# Patient Record
Sex: Male | Born: 1935 | Race: White | Hispanic: No | Smoking: Former smoker
Health system: Southern US, Community
[De-identification: ages and names within clinical notes are randomized; demographics above are authoritative.]

## PROBLEM LIST (undated history)

## (undated) DIAGNOSIS — I2510 Atherosclerotic heart disease of native coronary artery without angina pectoris: Secondary | ICD-10-CM

## (undated) DIAGNOSIS — I951 Orthostatic hypotension: Secondary | ICD-10-CM

## (undated) DIAGNOSIS — E039 Hypothyroidism, unspecified: Secondary | ICD-10-CM

## (undated) DIAGNOSIS — E785 Hyperlipidemia, unspecified: Secondary | ICD-10-CM

## (undated) DIAGNOSIS — J449 Chronic obstructive pulmonary disease, unspecified: Secondary | ICD-10-CM

## (undated) DIAGNOSIS — I480 Paroxysmal atrial fibrillation: Secondary | ICD-10-CM

## (undated) DIAGNOSIS — G250 Essential tremor: Secondary | ICD-10-CM

## (undated) DIAGNOSIS — I10 Essential (primary) hypertension: Secondary | ICD-10-CM

## (undated) DIAGNOSIS — K274 Chronic or unspecified peptic ulcer, site unspecified, with hemorrhage: Secondary | ICD-10-CM

## (undated) HISTORY — PX: HERNIA REPAIR: SHX51

## (undated) HISTORY — DX: Chronic or unspecified peptic ulcer, site unspecified, with hemorrhage: K27.4

## (undated) HISTORY — DX: Chronic obstructive pulmonary disease, unspecified: J44.9

## (undated) HISTORY — DX: Hyperlipidemia, unspecified: E78.5

## (undated) HISTORY — DX: Atherosclerotic heart disease of native coronary artery without angina pectoris: I25.10

## (undated) HISTORY — DX: Essential tremor: G25.0

---

## 1973-12-08 HISTORY — PX: SPINE SURGERY: SHX786

## 2005-04-18 ENCOUNTER — Ambulatory Visit: Payer: Self-pay | Admitting: Unknown Physician Specialty

## 2012-03-31 DIAGNOSIS — I10 Essential (primary) hypertension: Secondary | ICD-10-CM | POA: Diagnosis not present

## 2012-03-31 DIAGNOSIS — J449 Chronic obstructive pulmonary disease, unspecified: Secondary | ICD-10-CM | POA: Diagnosis not present

## 2012-03-31 DIAGNOSIS — E785 Hyperlipidemia, unspecified: Secondary | ICD-10-CM | POA: Diagnosis not present

## 2012-03-31 DIAGNOSIS — J441 Chronic obstructive pulmonary disease with (acute) exacerbation: Secondary | ICD-10-CM | POA: Diagnosis not present

## 2012-03-31 DIAGNOSIS — I2510 Atherosclerotic heart disease of native coronary artery without angina pectoris: Secondary | ICD-10-CM | POA: Diagnosis not present

## 2012-04-23 DIAGNOSIS — J449 Chronic obstructive pulmonary disease, unspecified: Secondary | ICD-10-CM | POA: Diagnosis not present

## 2012-04-23 DIAGNOSIS — J209 Acute bronchitis, unspecified: Secondary | ICD-10-CM | POA: Diagnosis not present

## 2012-04-23 DIAGNOSIS — J069 Acute upper respiratory infection, unspecified: Secondary | ICD-10-CM | POA: Diagnosis not present

## 2012-06-03 DIAGNOSIS — M6740 Ganglion, unspecified site: Secondary | ICD-10-CM | POA: Diagnosis not present

## 2012-09-07 DIAGNOSIS — K274 Chronic or unspecified peptic ulcer, site unspecified, with hemorrhage: Secondary | ICD-10-CM

## 2012-09-07 DIAGNOSIS — K284 Chronic or unspecified gastrojejunal ulcer with hemorrhage: Secondary | ICD-10-CM

## 2012-09-07 HISTORY — DX: Chronic or unspecified gastrojejunal ulcer with hemorrhage: K28.4

## 2012-09-07 HISTORY — DX: Chronic or unspecified peptic ulcer, site unspecified, with hemorrhage: K27.4

## 2012-09-28 DIAGNOSIS — I10 Essential (primary) hypertension: Secondary | ICD-10-CM | POA: Diagnosis not present

## 2012-09-28 DIAGNOSIS — Z0000 Encounter for general adult medical examination without abnormal findings: Secondary | ICD-10-CM | POA: Diagnosis not present

## 2012-09-28 DIAGNOSIS — J449 Chronic obstructive pulmonary disease, unspecified: Secondary | ICD-10-CM | POA: Diagnosis not present

## 2012-09-28 DIAGNOSIS — J441 Chronic obstructive pulmonary disease with (acute) exacerbation: Secondary | ICD-10-CM | POA: Diagnosis not present

## 2012-09-28 DIAGNOSIS — E785 Hyperlipidemia, unspecified: Secondary | ICD-10-CM | POA: Diagnosis not present

## 2012-09-28 DIAGNOSIS — Z23 Encounter for immunization: Secondary | ICD-10-CM | POA: Diagnosis not present

## 2012-09-28 DIAGNOSIS — I2510 Atherosclerotic heart disease of native coronary artery without angina pectoris: Secondary | ICD-10-CM | POA: Diagnosis not present

## 2012-09-28 DIAGNOSIS — Z125 Encounter for screening for malignant neoplasm of prostate: Secondary | ICD-10-CM | POA: Diagnosis not present

## 2012-10-02 ENCOUNTER — Inpatient Hospital Stay: Payer: Self-pay | Admitting: Student

## 2012-10-02 DIAGNOSIS — E785 Hyperlipidemia, unspecified: Secondary | ICD-10-CM | POA: Diagnosis present

## 2012-10-02 DIAGNOSIS — K222 Esophageal obstruction: Secondary | ICD-10-CM | POA: Diagnosis not present

## 2012-10-02 DIAGNOSIS — R079 Chest pain, unspecified: Secondary | ICD-10-CM | POA: Diagnosis not present

## 2012-10-02 DIAGNOSIS — Z043 Encounter for examination and observation following other accident: Secondary | ICD-10-CM | POA: Diagnosis not present

## 2012-10-02 DIAGNOSIS — Z7982 Long term (current) use of aspirin: Secondary | ICD-10-CM | POA: Diagnosis not present

## 2012-10-02 DIAGNOSIS — I951 Orthostatic hypotension: Secondary | ICD-10-CM | POA: Diagnosis not present

## 2012-10-02 DIAGNOSIS — R6889 Other general symptoms and signs: Secondary | ICD-10-CM | POA: Diagnosis not present

## 2012-10-02 DIAGNOSIS — E039 Hypothyroidism, unspecified: Secondary | ICD-10-CM | POA: Diagnosis not present

## 2012-10-02 DIAGNOSIS — Z791 Long term (current) use of non-steroidal anti-inflammatories (NSAID): Secondary | ICD-10-CM | POA: Diagnosis not present

## 2012-10-02 DIAGNOSIS — J4489 Other specified chronic obstructive pulmonary disease: Secondary | ICD-10-CM | POA: Diagnosis not present

## 2012-10-02 DIAGNOSIS — K259 Gastric ulcer, unspecified as acute or chronic, without hemorrhage or perforation: Secondary | ICD-10-CM | POA: Diagnosis present

## 2012-10-02 DIAGNOSIS — J9383 Other pneumothorax: Secondary | ICD-10-CM | POA: Diagnosis present

## 2012-10-02 DIAGNOSIS — K2961 Other gastritis with bleeding: Secondary | ICD-10-CM | POA: Diagnosis not present

## 2012-10-02 DIAGNOSIS — I10 Essential (primary) hypertension: Secondary | ICD-10-CM | POA: Diagnosis present

## 2012-10-02 DIAGNOSIS — K921 Melena: Secondary | ICD-10-CM | POA: Diagnosis not present

## 2012-10-02 DIAGNOSIS — K2960 Other gastritis without bleeding: Secondary | ICD-10-CM | POA: Diagnosis not present

## 2012-10-02 DIAGNOSIS — K922 Gastrointestinal hemorrhage, unspecified: Secondary | ICD-10-CM | POA: Diagnosis not present

## 2012-10-02 DIAGNOSIS — J449 Chronic obstructive pulmonary disease, unspecified: Secondary | ICD-10-CM | POA: Diagnosis not present

## 2012-10-02 DIAGNOSIS — R55 Syncope and collapse: Secondary | ICD-10-CM | POA: Diagnosis not present

## 2012-10-02 DIAGNOSIS — D62 Acute posthemorrhagic anemia: Secondary | ICD-10-CM | POA: Diagnosis present

## 2012-10-02 LAB — CBC
HCT: 32.3 % — ABNORMAL LOW (ref 40.0–52.0)
MCH: 33.2 pg (ref 26.0–34.0)
MCHC: 33.8 g/dL (ref 32.0–36.0)
RBC: 3.29 10*6/uL — ABNORMAL LOW (ref 4.40–5.90)
RDW: 13.5 % (ref 11.5–14.5)
WBC: 8.8 10*3/uL (ref 3.8–10.6)

## 2012-10-02 LAB — URINALYSIS, COMPLETE
Bacteria: NONE SEEN
Blood: NEGATIVE
Glucose,UR: NEGATIVE mg/dL (ref 0–75)
Hyaline Cast: 1
Nitrite: NEGATIVE
Ph: 6 (ref 4.5–8.0)
RBC,UR: 1 /HPF (ref 0–5)
Specific Gravity: 1.02 (ref 1.003–1.030)
WBC UR: 2 /HPF (ref 0–5)

## 2012-10-02 LAB — BASIC METABOLIC PANEL
Anion Gap: 7 (ref 7–16)
Calcium, Total: 8 mg/dL — ABNORMAL LOW (ref 8.5–10.1)
Chloride: 112 mmol/L — ABNORMAL HIGH (ref 98–107)
Co2: 26 mmol/L (ref 21–32)
Creatinine: 1.04 mg/dL (ref 0.60–1.30)
EGFR (African American): >60
Potassium: 4.3 mmol/L (ref 3.5–5.1)

## 2012-10-02 LAB — TROPONIN I: Troponin-I: <0.02 ng/mL

## 2012-10-02 LAB — HEMOGLOBIN: HGB: 8.8 g/dL — ABNORMAL LOW (ref 13.0–18.0)

## 2012-10-03 LAB — CBC WITH DIFFERENTIAL/PLATELET
Basophil %: 0.3 %
Basophil %: 0.5 %
Eosinophil %: 1.6 %
Eosinophil %: 1.6 %
HCT: 26.9 % — ABNORMAL LOW (ref 40.0–52.0)
HGB: 8.9 g/dL — ABNORMAL LOW (ref 13.0–18.0)
HGB: 8.2 g/dL — ABNORMAL LOW (ref 13.0–18.0)
Lymphocyte #: 1.2 10*3/uL (ref 1.0–3.6)
Lymphocyte #: 1.3 10*3/uL (ref 1.0–3.6)
Lymphocyte %: 18.2 %
MCH: 32.5 pg (ref 26.0–34.0)
MCH: 33.2 pg (ref 26.0–34.0)
MCV: 98 fL (ref 80–100)
MCV: 97 fL (ref 80–100)
Monocyte #: 0.4 x10 3/mm (ref 0.2–1.0)
Monocyte #: 0.4 x10 3/mm (ref 0.2–1.0)
Monocyte %: 7.1 %
Neutrophil #: 3.7 10*3/uL (ref 1.4–6.5)
RBC: 2.73 10*6/uL — ABNORMAL LOW (ref 4.40–5.90)
RBC: 2.48 10*6/uL — ABNORMAL LOW (ref 4.40–5.90)
RDW: 13.4 % (ref 11.5–14.5)
RDW: 13.7 % (ref 11.5–14.5)
WBC: 6.4 10*3/uL (ref 3.8–10.6)

## 2012-10-03 LAB — COMPREHENSIVE METABOLIC PANEL
Albumin: 2.8 g/dL — ABNORMAL LOW (ref 3.4–5.0)
Anion Gap: 9 (ref 7–16)
BUN: 31 mg/dL — ABNORMAL HIGH (ref 7–18)
Calcium, Total: 7.6 mg/dL — ABNORMAL LOW (ref 8.5–10.1)
Glucose: 67 mg/dL (ref 65–99)
Osmolality: 293 (ref 275–301)
Potassium: 3.8 mmol/L (ref 3.5–5.1)
SGPT (ALT): 13 U/L (ref 12–78)
Sodium: 145 mmol/L (ref 136–145)
Total Protein: 5 g/dL — ABNORMAL LOW (ref 6.4–8.2)

## 2012-10-03 LAB — BUN
BUN: 26 mg/dL — ABNORMAL HIGH (ref 7–18)
BUN: 24 mg/dL — ABNORMAL HIGH (ref 7–18)

## 2012-10-03 LAB — PROTIME-INR
INR: 1.3
Prothrombin Time: 16.1 secs — ABNORMAL HIGH (ref 11.5–14.7)

## 2012-10-03 LAB — HEMOGLOBIN: HGB: 8.4 g/dL — ABNORMAL LOW (ref 13.0–18.0)

## 2012-10-04 LAB — CBC WITH DIFFERENTIAL/PLATELET
Basophil %: 0.5 %
Eosinophil #: 0.1 10*3/uL (ref 0.0–0.7)
Eosinophil %: 2.1 %
HGB: 8.2 g/dL — ABNORMAL LOW (ref 13.0–18.0)
Lymphocyte %: 22.1 %
MCH: 33.9 pg (ref 26.0–34.0)
MCV: 98 fL (ref 80–100)
Monocyte #: 0.3 x10 3/mm (ref 0.2–1.0)
Neutrophil #: 3.6 10*3/uL (ref 1.4–6.5)
Neutrophil %: 69.4 %
RDW: 13.6 % (ref 11.5–14.5)
WBC: 5.2 10*3/uL (ref 3.8–10.6)

## 2012-10-05 LAB — CBC WITH DIFFERENTIAL/PLATELET
Basophil #: 0 10*3/uL (ref 0.0–0.1)
Basophil %: 0.4 %
Eosinophil #: 0.2 10*3/uL (ref 0.0–0.7)
HCT: 25.3 % — ABNORMAL LOW (ref 40.0–52.0)
Lymphocyte #: 1.4 10*3/uL (ref 1.0–3.6)
Lymphocyte %: 28.7 %
MCHC: 34.7 g/dL (ref 32.0–36.0)
MCV: 97 fL (ref 80–100)
Neutrophil #: 3 10*3/uL (ref 1.4–6.5)
Platelet: 129 10*3/uL — ABNORMAL LOW (ref 150–440)
RDW: 13.4 % (ref 11.5–14.5)

## 2012-10-12 DIAGNOSIS — K922 Gastrointestinal hemorrhage, unspecified: Secondary | ICD-10-CM | POA: Diagnosis not present

## 2012-10-12 DIAGNOSIS — R0602 Shortness of breath: Secondary | ICD-10-CM | POA: Diagnosis not present

## 2012-10-12 DIAGNOSIS — J449 Chronic obstructive pulmonary disease, unspecified: Secondary | ICD-10-CM | POA: Diagnosis not present

## 2012-10-12 DIAGNOSIS — D649 Anemia, unspecified: Secondary | ICD-10-CM | POA: Diagnosis not present

## 2012-10-20 DIAGNOSIS — K259 Gastric ulcer, unspecified as acute or chronic, without hemorrhage or perforation: Secondary | ICD-10-CM | POA: Diagnosis not present

## 2012-11-25 ENCOUNTER — Ambulatory Visit: Payer: Self-pay | Admitting: Family Medicine

## 2012-11-25 DIAGNOSIS — M25559 Pain in unspecified hip: Secondary | ICD-10-CM | POA: Diagnosis not present

## 2012-11-25 DIAGNOSIS — R5381 Other malaise: Secondary | ICD-10-CM | POA: Diagnosis not present

## 2012-11-25 DIAGNOSIS — Q278 Other specified congenital malformations of peripheral vascular system: Secondary | ICD-10-CM | POA: Diagnosis not present

## 2012-11-25 DIAGNOSIS — R5383 Other fatigue: Secondary | ICD-10-CM | POA: Diagnosis not present

## 2012-11-25 DIAGNOSIS — M25569 Pain in unspecified knee: Secondary | ICD-10-CM | POA: Diagnosis not present

## 2012-11-25 DIAGNOSIS — M5137 Other intervertebral disc degeneration, lumbosacral region: Secondary | ICD-10-CM | POA: Diagnosis not present

## 2012-11-25 DIAGNOSIS — M51379 Other intervertebral disc degeneration, lumbosacral region without mention of lumbar back pain or lower extremity pain: Secondary | ICD-10-CM | POA: Diagnosis not present

## 2012-11-29 ENCOUNTER — Ambulatory Visit: Payer: Self-pay | Admitting: Family Medicine

## 2012-11-29 DIAGNOSIS — Z1389 Encounter for screening for other disorder: Secondary | ICD-10-CM | POA: Diagnosis not present

## 2012-11-29 LAB — CREATININE, SERUM: EGFR (Non-African Amer.): >60

## 2012-12-14 ENCOUNTER — Ambulatory Visit: Payer: Self-pay | Admitting: Family Medicine

## 2012-12-14 DIAGNOSIS — M5126 Other intervertebral disc displacement, lumbar region: Secondary | ICD-10-CM | POA: Diagnosis not present

## 2012-12-14 DIAGNOSIS — M47817 Spondylosis without myelopathy or radiculopathy, lumbosacral region: Secondary | ICD-10-CM | POA: Diagnosis not present

## 2012-12-21 ENCOUNTER — Ambulatory Visit: Payer: Self-pay | Admitting: Gastroenterology

## 2012-12-21 DIAGNOSIS — K2990 Gastroduodenitis, unspecified, without bleeding: Secondary | ICD-10-CM | POA: Diagnosis not present

## 2012-12-21 DIAGNOSIS — I10 Essential (primary) hypertension: Secondary | ICD-10-CM | POA: Diagnosis not present

## 2012-12-21 DIAGNOSIS — K449 Diaphragmatic hernia without obstruction or gangrene: Secondary | ICD-10-CM | POA: Diagnosis not present

## 2012-12-21 DIAGNOSIS — J449 Chronic obstructive pulmonary disease, unspecified: Secondary | ICD-10-CM | POA: Diagnosis not present

## 2012-12-21 DIAGNOSIS — E785 Hyperlipidemia, unspecified: Secondary | ICD-10-CM | POA: Diagnosis not present

## 2012-12-21 DIAGNOSIS — K2100 Gastro-esophageal reflux disease with esophagitis, without bleeding: Secondary | ICD-10-CM | POA: Diagnosis not present

## 2012-12-21 DIAGNOSIS — E039 Hypothyroidism, unspecified: Secondary | ICD-10-CM | POA: Diagnosis not present

## 2012-12-21 DIAGNOSIS — K2970 Gastritis, unspecified, without bleeding: Secondary | ICD-10-CM | POA: Diagnosis not present

## 2012-12-21 DIAGNOSIS — Z79899 Other long term (current) drug therapy: Secondary | ICD-10-CM | POA: Diagnosis not present

## 2012-12-21 DIAGNOSIS — K222 Esophageal obstruction: Secondary | ICD-10-CM | POA: Diagnosis not present

## 2012-12-21 DIAGNOSIS — Z8711 Personal history of peptic ulcer disease: Secondary | ICD-10-CM | POA: Diagnosis not present

## 2012-12-21 DIAGNOSIS — K259 Gastric ulcer, unspecified as acute or chronic, without hemorrhage or perforation: Secondary | ICD-10-CM | POA: Diagnosis not present

## 2012-12-22 DIAGNOSIS — M5137 Other intervertebral disc degeneration, lumbosacral region: Secondary | ICD-10-CM | POA: Diagnosis not present

## 2012-12-22 LAB — PATHOLOGY REPORT

## 2013-01-06 DIAGNOSIS — M5137 Other intervertebral disc degeneration, lumbosacral region: Secondary | ICD-10-CM | POA: Diagnosis not present

## 2013-01-17 DIAGNOSIS — M5137 Other intervertebral disc degeneration, lumbosacral region: Secondary | ICD-10-CM | POA: Diagnosis not present

## 2013-01-27 DIAGNOSIS — K259 Gastric ulcer, unspecified as acute or chronic, without hemorrhage or perforation: Secondary | ICD-10-CM | POA: Diagnosis not present

## 2013-04-06 DIAGNOSIS — I2510 Atherosclerotic heart disease of native coronary artery without angina pectoris: Secondary | ICD-10-CM | POA: Diagnosis not present

## 2013-04-06 DIAGNOSIS — I10 Essential (primary) hypertension: Secondary | ICD-10-CM | POA: Diagnosis not present

## 2013-04-06 DIAGNOSIS — E785 Hyperlipidemia, unspecified: Secondary | ICD-10-CM | POA: Diagnosis not present

## 2013-04-07 DIAGNOSIS — C44319 Basal cell carcinoma of skin of other parts of face: Secondary | ICD-10-CM | POA: Diagnosis not present

## 2013-04-07 DIAGNOSIS — L819 Disorder of pigmentation, unspecified: Secondary | ICD-10-CM | POA: Diagnosis not present

## 2013-04-07 DIAGNOSIS — Z85828 Personal history of other malignant neoplasm of skin: Secondary | ICD-10-CM | POA: Diagnosis not present

## 2013-04-07 DIAGNOSIS — L821 Other seborrheic keratosis: Secondary | ICD-10-CM | POA: Diagnosis not present

## 2013-04-07 DIAGNOSIS — Z0189 Encounter for other specified special examinations: Secondary | ICD-10-CM | POA: Diagnosis not present

## 2013-04-07 DIAGNOSIS — L570 Actinic keratosis: Secondary | ICD-10-CM | POA: Diagnosis not present

## 2013-05-04 DIAGNOSIS — C44319 Basal cell carcinoma of skin of other parts of face: Secondary | ICD-10-CM | POA: Diagnosis not present

## 2013-05-04 DIAGNOSIS — L570 Actinic keratosis: Secondary | ICD-10-CM | POA: Diagnosis not present

## 2013-05-18 DIAGNOSIS — H2510 Age-related nuclear cataract, unspecified eye: Secondary | ICD-10-CM | POA: Diagnosis not present

## 2013-06-01 DIAGNOSIS — H35379 Puckering of macula, unspecified eye: Secondary | ICD-10-CM | POA: Diagnosis not present

## 2013-06-01 DIAGNOSIS — H2510 Age-related nuclear cataract, unspecified eye: Secondary | ICD-10-CM | POA: Diagnosis not present

## 2013-06-21 DIAGNOSIS — H259 Unspecified age-related cataract: Secondary | ICD-10-CM | POA: Diagnosis not present

## 2013-06-21 DIAGNOSIS — H269 Unspecified cataract: Secondary | ICD-10-CM | POA: Diagnosis not present

## 2013-06-21 DIAGNOSIS — H2510 Age-related nuclear cataract, unspecified eye: Secondary | ICD-10-CM | POA: Diagnosis not present

## 2013-07-21 DIAGNOSIS — H2510 Age-related nuclear cataract, unspecified eye: Secondary | ICD-10-CM | POA: Diagnosis not present

## 2013-07-21 DIAGNOSIS — Z961 Presence of intraocular lens: Secondary | ICD-10-CM | POA: Diagnosis not present

## 2013-08-09 DIAGNOSIS — H2510 Age-related nuclear cataract, unspecified eye: Secondary | ICD-10-CM | POA: Diagnosis not present

## 2013-08-09 DIAGNOSIS — H259 Unspecified age-related cataract: Secondary | ICD-10-CM | POA: Diagnosis not present

## 2013-08-09 DIAGNOSIS — H269 Unspecified cataract: Secondary | ICD-10-CM | POA: Diagnosis not present

## 2013-10-04 DIAGNOSIS — Z125 Encounter for screening for malignant neoplasm of prostate: Secondary | ICD-10-CM | POA: Diagnosis not present

## 2013-10-04 DIAGNOSIS — I498 Other specified cardiac arrhythmias: Secondary | ICD-10-CM | POA: Diagnosis not present

## 2013-10-04 DIAGNOSIS — Z23 Encounter for immunization: Secondary | ICD-10-CM | POA: Diagnosis not present

## 2013-10-04 DIAGNOSIS — I2510 Atherosclerotic heart disease of native coronary artery without angina pectoris: Secondary | ICD-10-CM | POA: Diagnosis not present

## 2013-10-04 DIAGNOSIS — E785 Hyperlipidemia, unspecified: Secondary | ICD-10-CM | POA: Diagnosis not present

## 2013-10-04 DIAGNOSIS — J449 Chronic obstructive pulmonary disease, unspecified: Secondary | ICD-10-CM | POA: Diagnosis not present

## 2013-10-04 DIAGNOSIS — Z0000 Encounter for general adult medical examination without abnormal findings: Secondary | ICD-10-CM | POA: Diagnosis not present

## 2013-11-10 DIAGNOSIS — G250 Essential tremor: Secondary | ICD-10-CM | POA: Diagnosis not present

## 2013-11-10 DIAGNOSIS — I2510 Atherosclerotic heart disease of native coronary artery without angina pectoris: Secondary | ICD-10-CM | POA: Diagnosis not present

## 2013-12-21 DIAGNOSIS — H6120 Impacted cerumen, unspecified ear: Secondary | ICD-10-CM | POA: Diagnosis not present

## 2013-12-21 DIAGNOSIS — H905 Unspecified sensorineural hearing loss: Secondary | ICD-10-CM | POA: Diagnosis not present

## 2013-12-21 DIAGNOSIS — H9319 Tinnitus, unspecified ear: Secondary | ICD-10-CM | POA: Diagnosis not present

## 2014-02-06 DIAGNOSIS — H35349 Macular cyst, hole, or pseudohole, unspecified eye: Secondary | ICD-10-CM | POA: Diagnosis not present

## 2014-03-15 DIAGNOSIS — I2510 Atherosclerotic heart disease of native coronary artery without angina pectoris: Secondary | ICD-10-CM | POA: Diagnosis not present

## 2014-03-15 DIAGNOSIS — E7800 Pure hypercholesterolemia, unspecified: Secondary | ICD-10-CM | POA: Diagnosis not present

## 2014-03-15 DIAGNOSIS — E785 Hyperlipidemia, unspecified: Secondary | ICD-10-CM | POA: Diagnosis not present

## 2014-03-15 DIAGNOSIS — J449 Chronic obstructive pulmonary disease, unspecified: Secondary | ICD-10-CM | POA: Diagnosis not present

## 2014-04-27 DIAGNOSIS — L0390 Cellulitis, unspecified: Secondary | ICD-10-CM | POA: Diagnosis not present

## 2014-04-27 DIAGNOSIS — L0291 Cutaneous abscess, unspecified: Secondary | ICD-10-CM | POA: Diagnosis not present

## 2014-07-03 IMAGING — CR DG KNEE COMPLETE 4+V*L*
1 series · 4 of 4 positions shown · non-contrast
Comparison: none

REASON FOR EXAM: chronic pain weakness lt side
COMMENTS:

[Series 1: ap · 0.17mm/px · 4 of 4 slices shown]
[im 1/4]
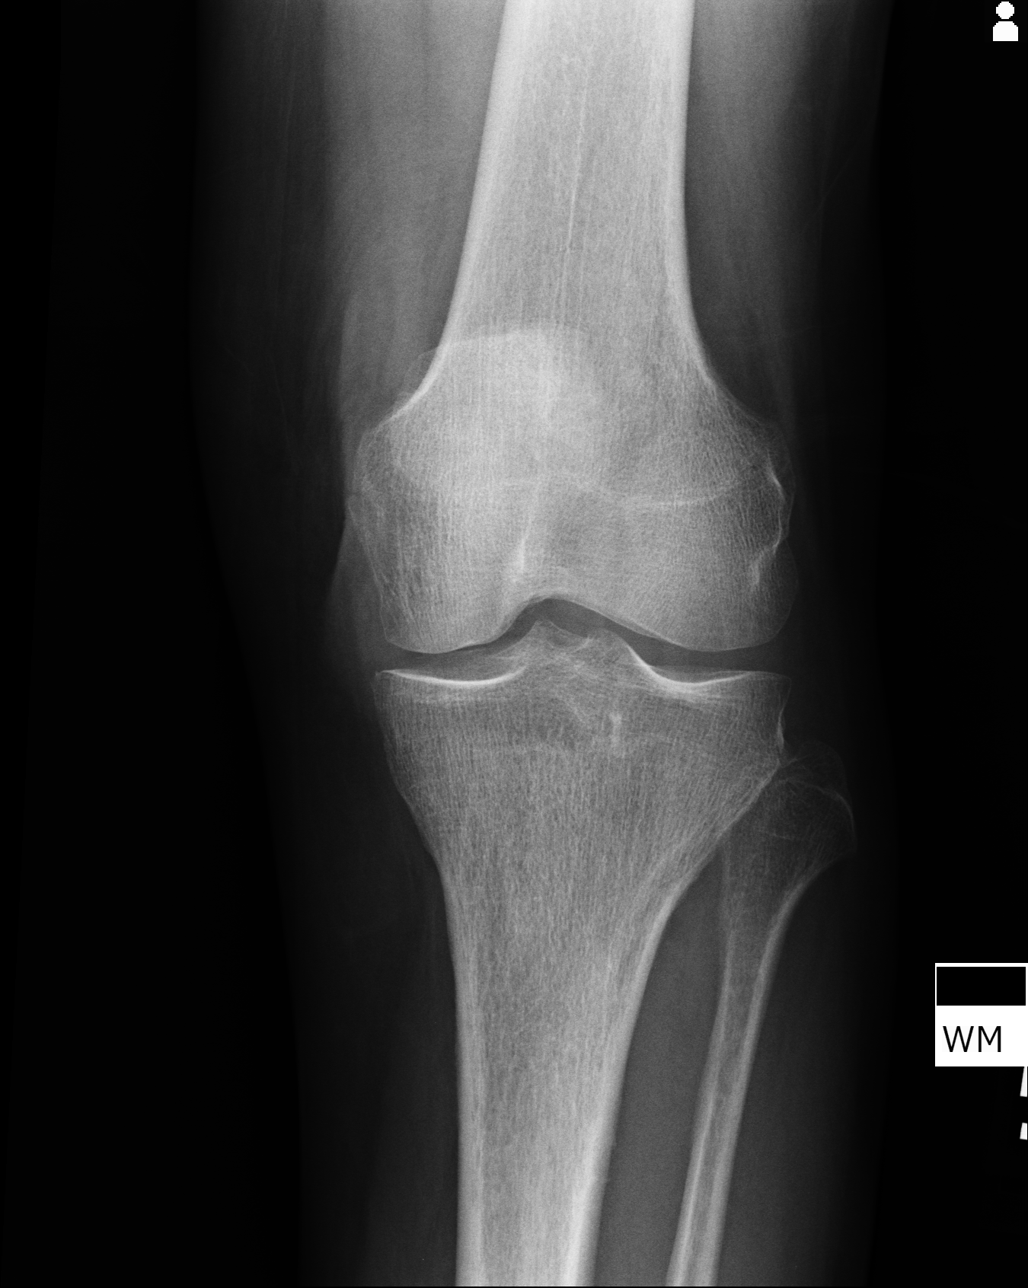
[im 2/4]
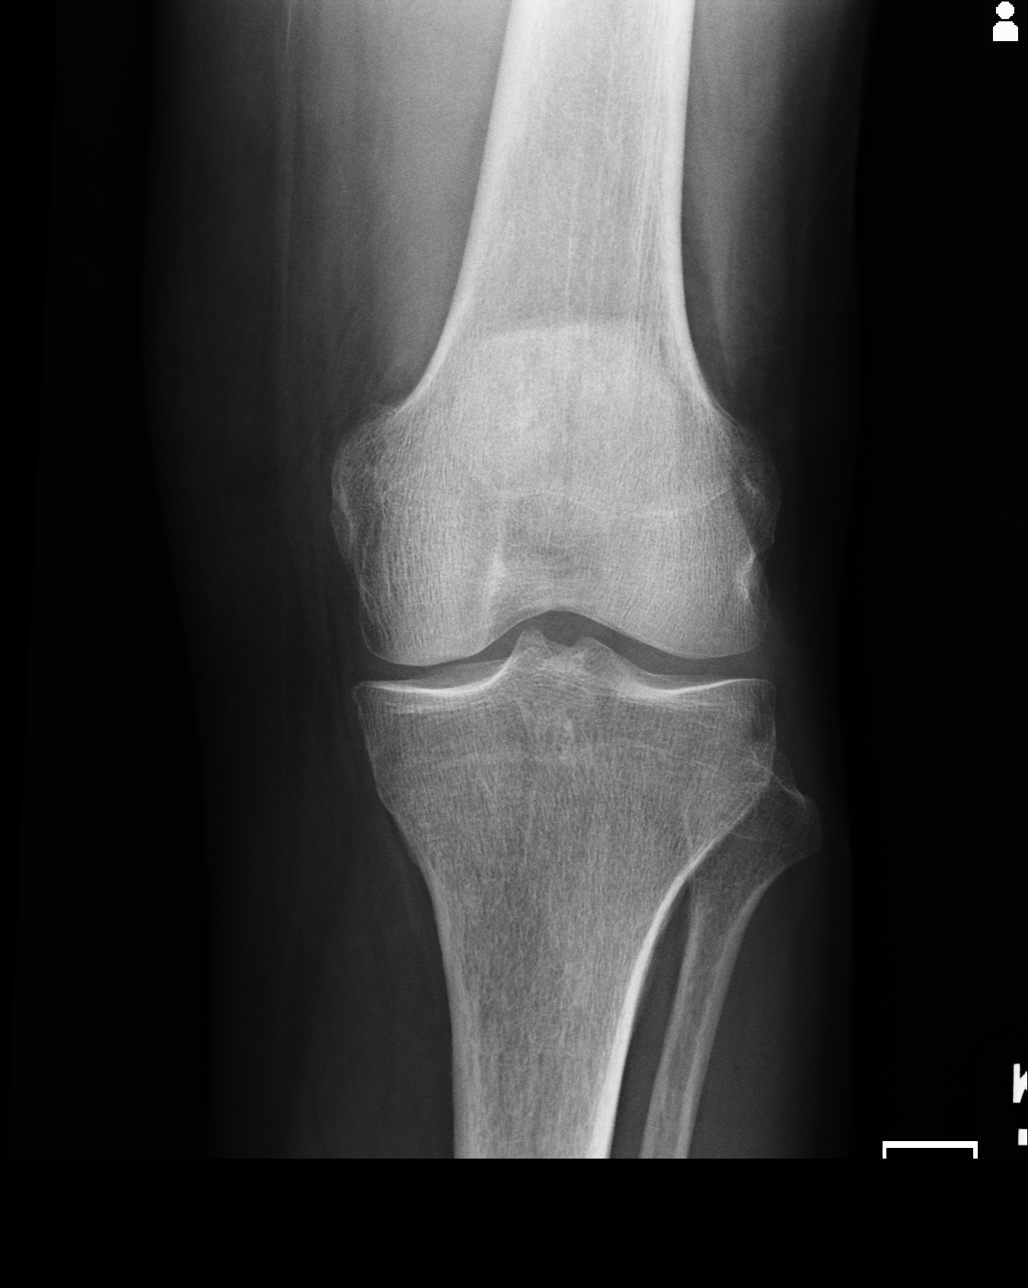
[im 3/4]
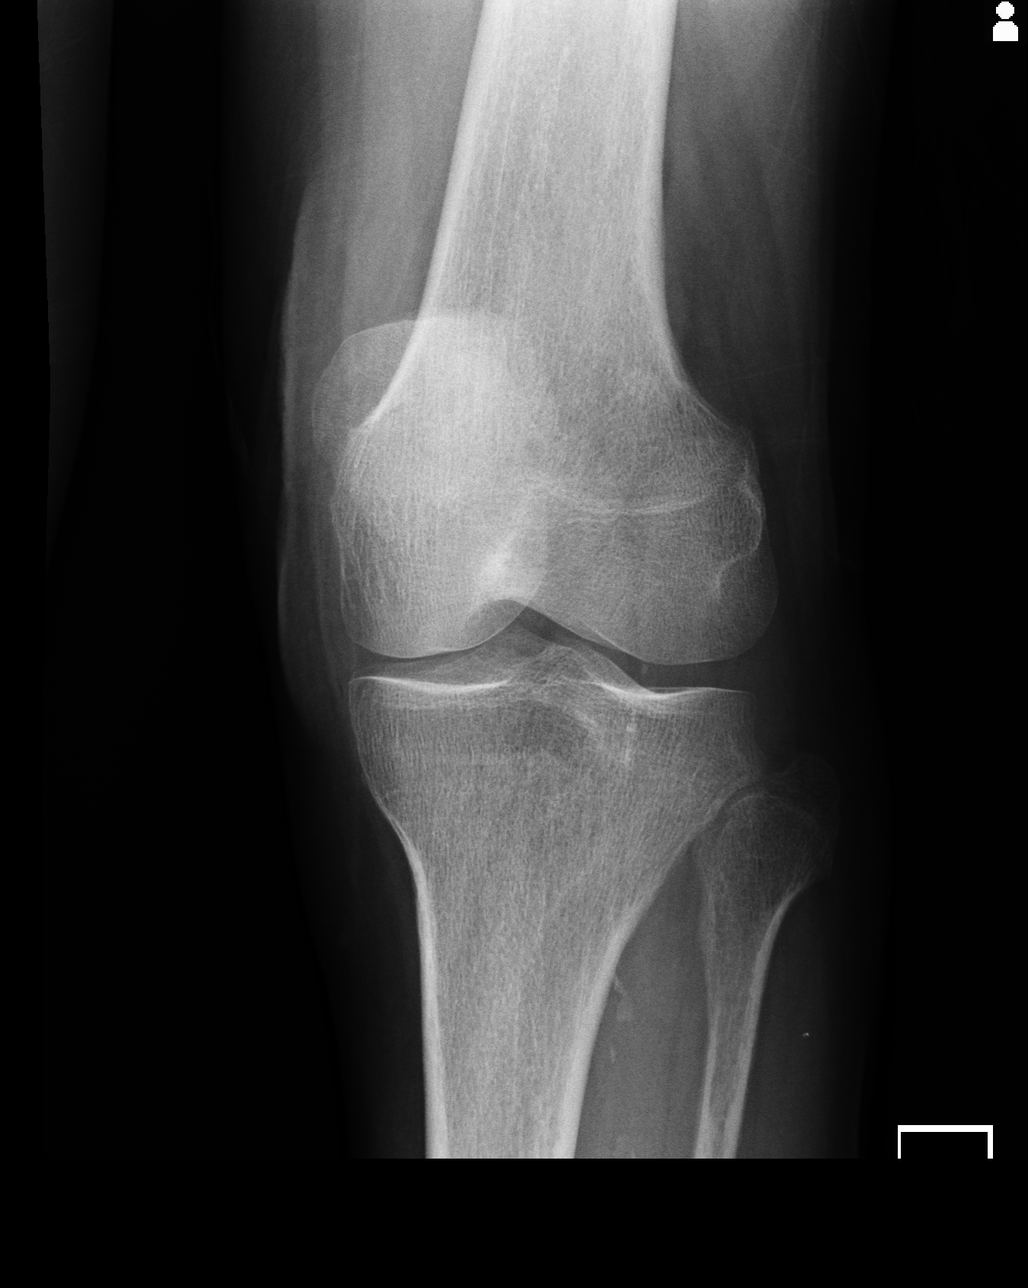
[im 4/4]
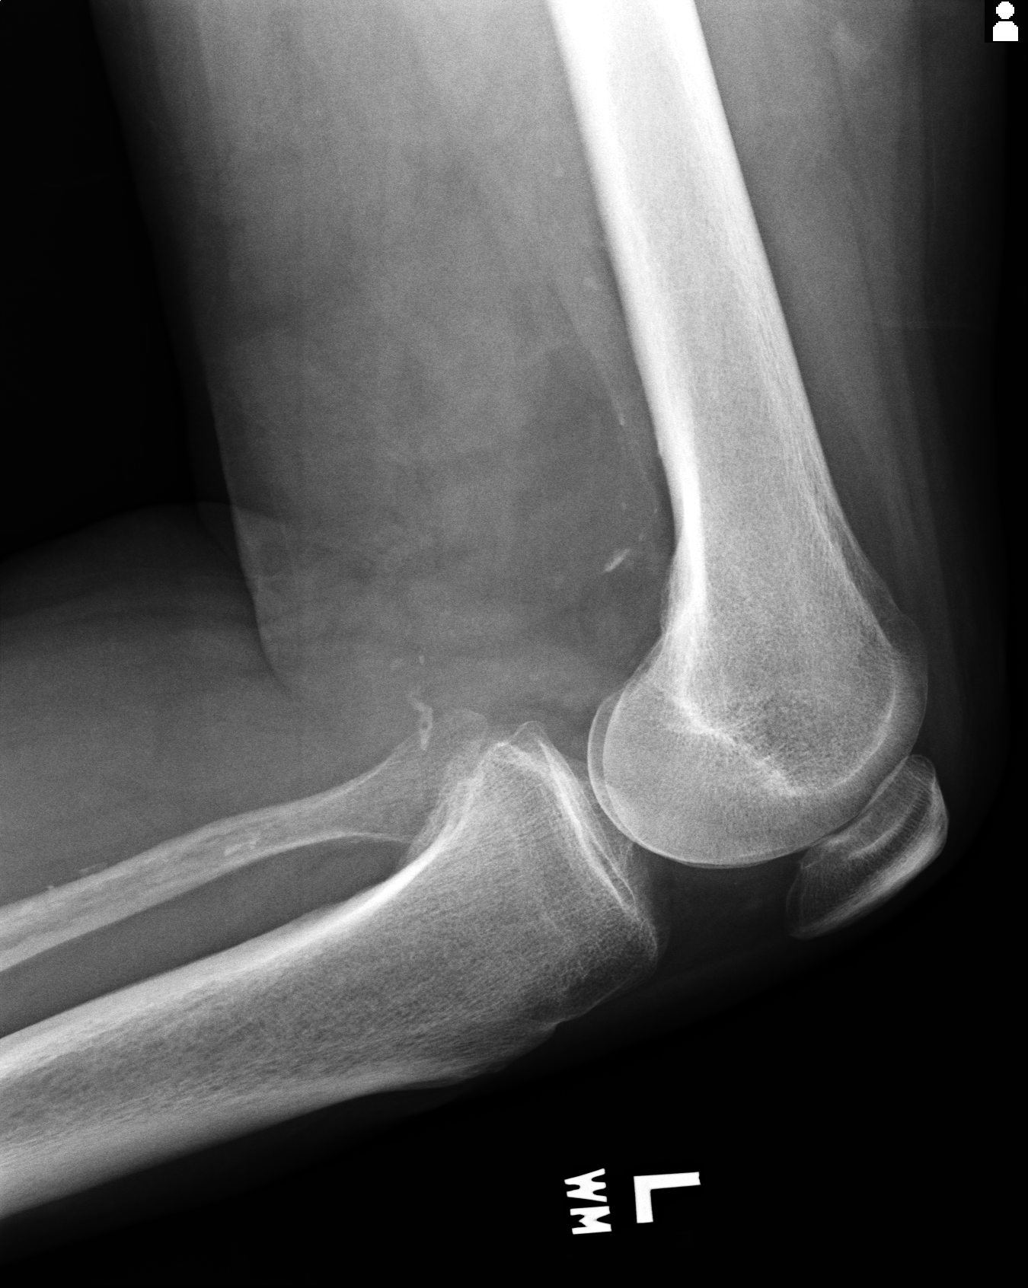

[4 of 4 positions shown; findings below may reference images not displayed]

PROCEDURE:     RINCHIN - RINCHIN KNEE LT COMP WITH OBLIQUES  - November 25, 2012 [DATE]

RESULT:     Four views of the left knee reveal the bones to be mildly
osteopenic. There is no evidence of lytic or blastic lesion or periosteal
reaction. There is no evidence of an acute fracture. The joint spaces appear
reasonably well-maintained. There is popliteal artery calcification.
IMPRESSION: There is no acute bony abnormality of the left knee. There
is no significant degenerative change demonstrated either.

[REDACTED]

## 2014-07-03 IMAGING — CR DG HIP COMPLETE 2+V*L*
1 series · 3 of 3 positions shown · non-contrast
Comparison: none

REASON FOR EXAM: chronic pain weakness lt side
COMMENTS:

[Series 1: ap · 0.17mm/px · 3 of 3 slices shown]
[im 1/3]
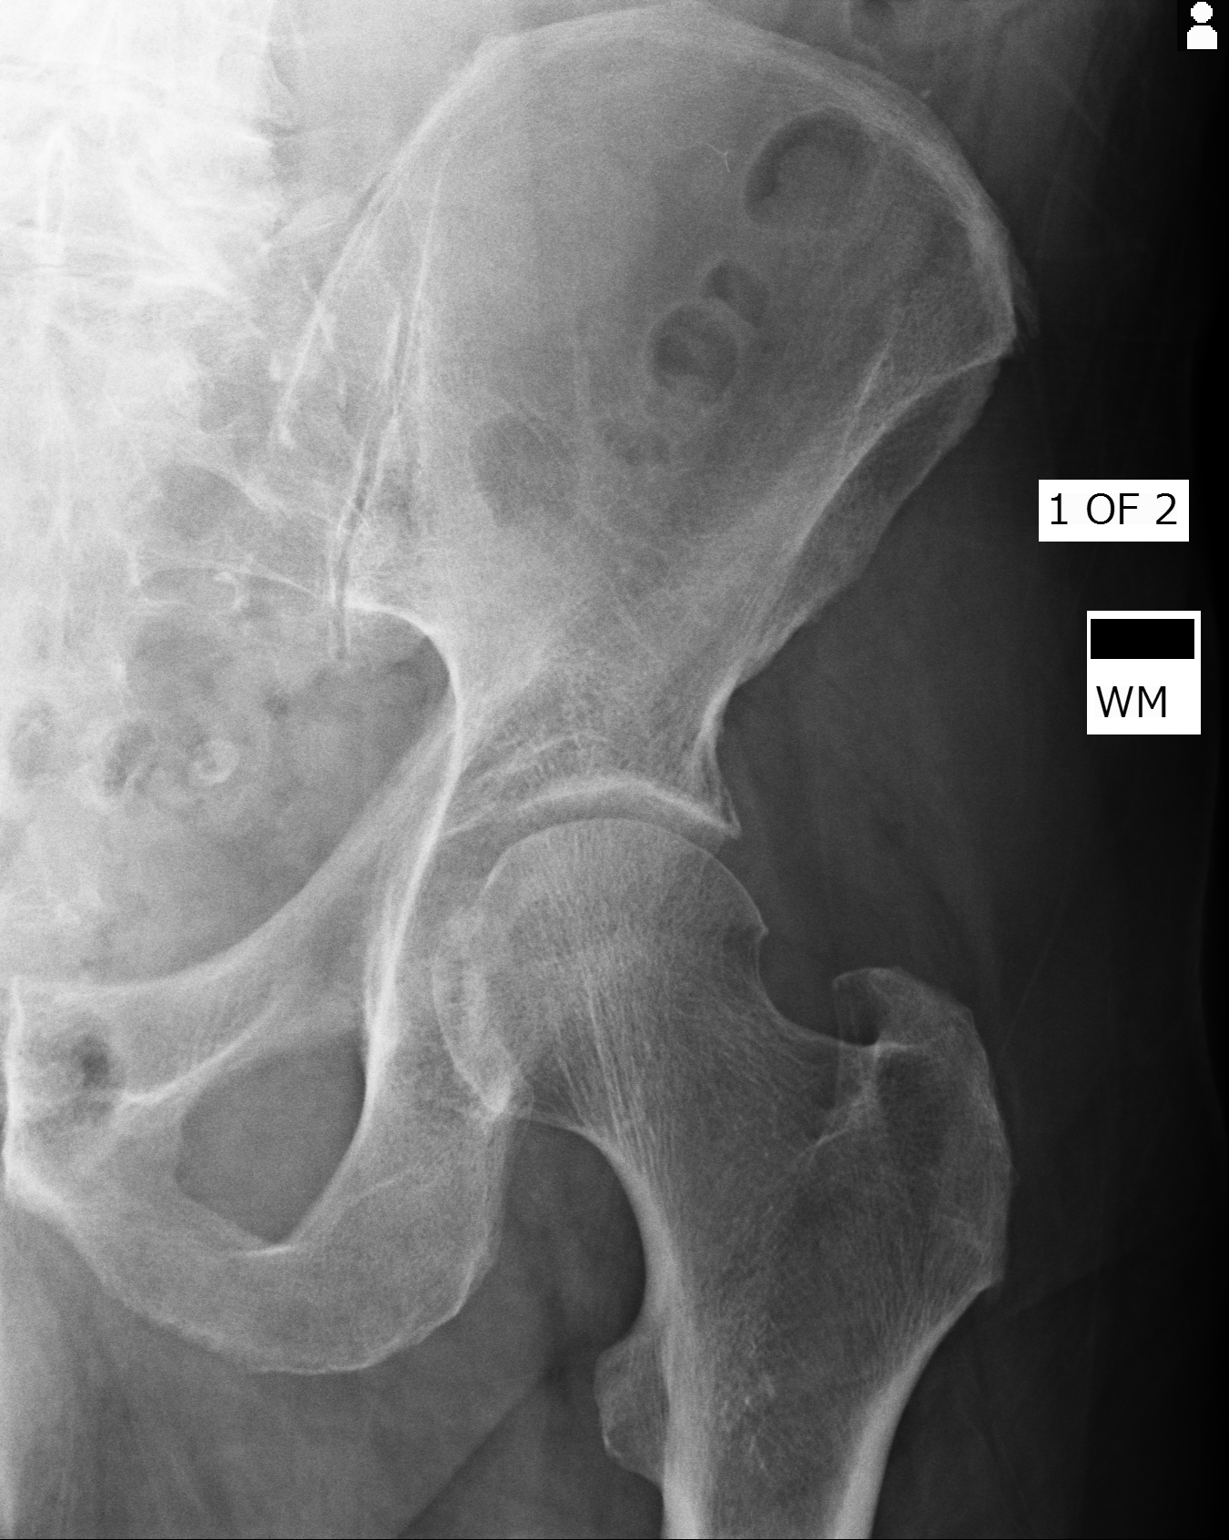
[im 2/3]
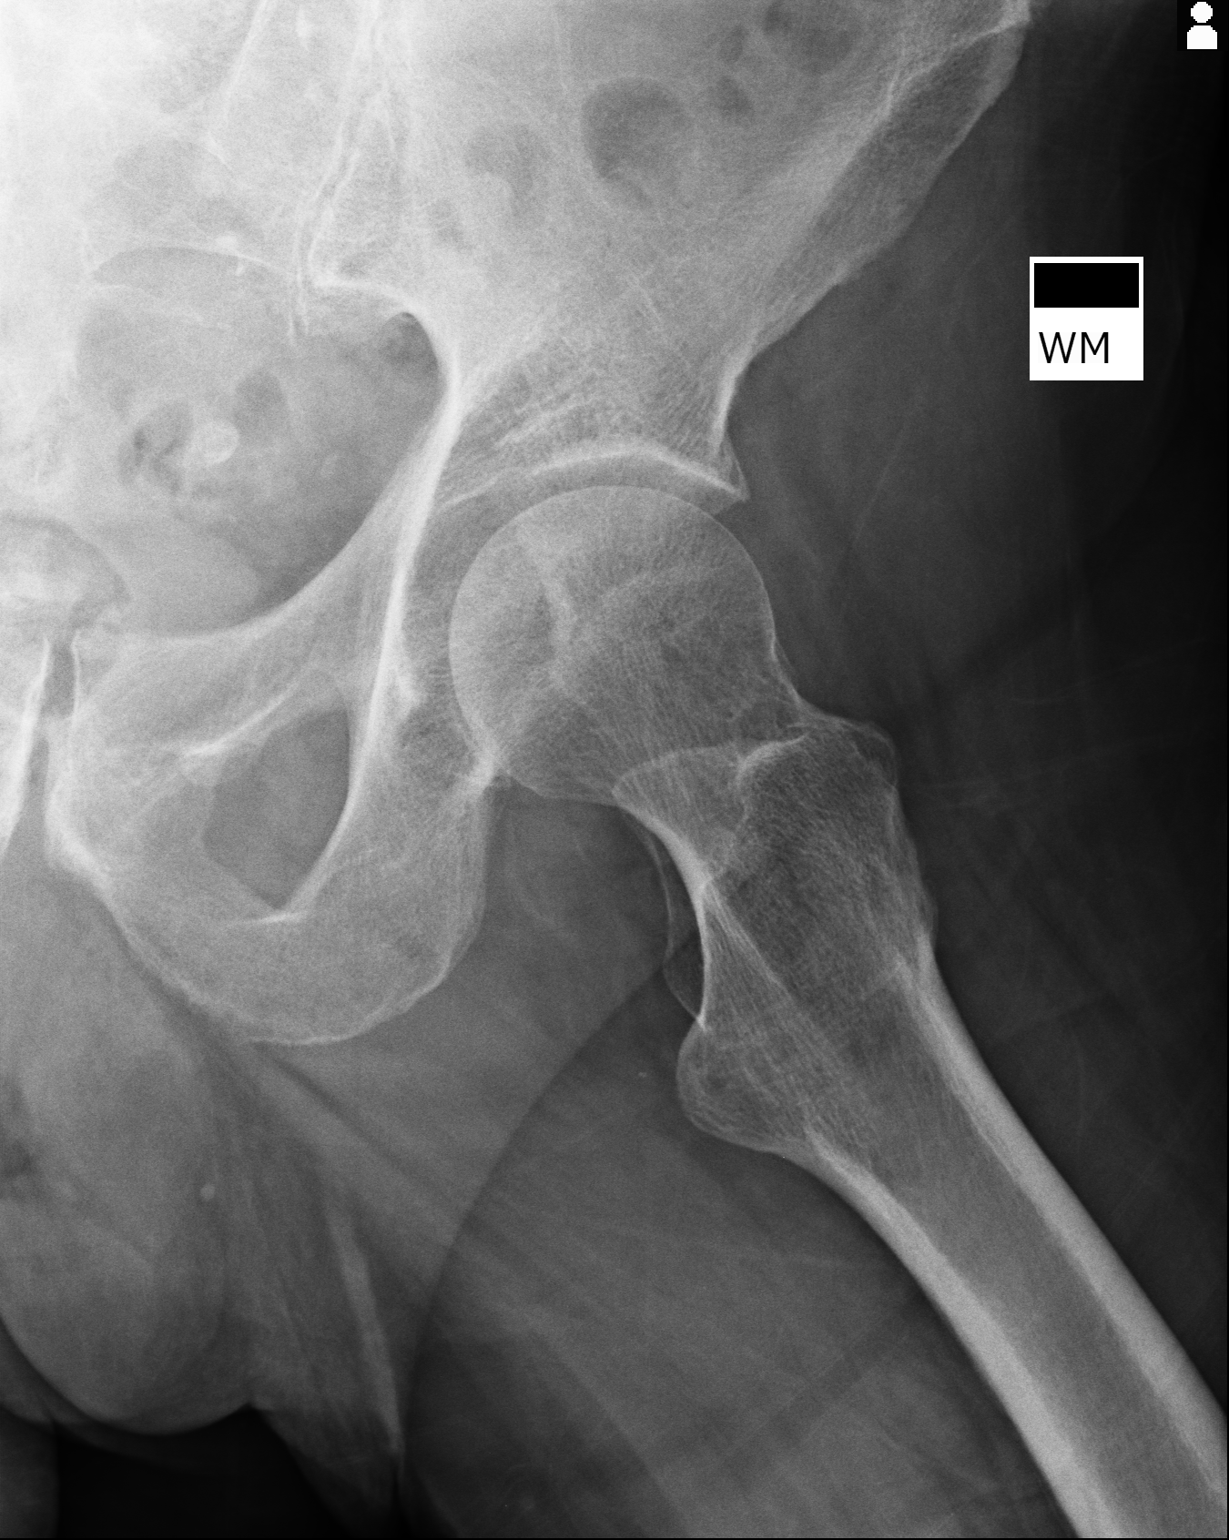
[im 3/3]
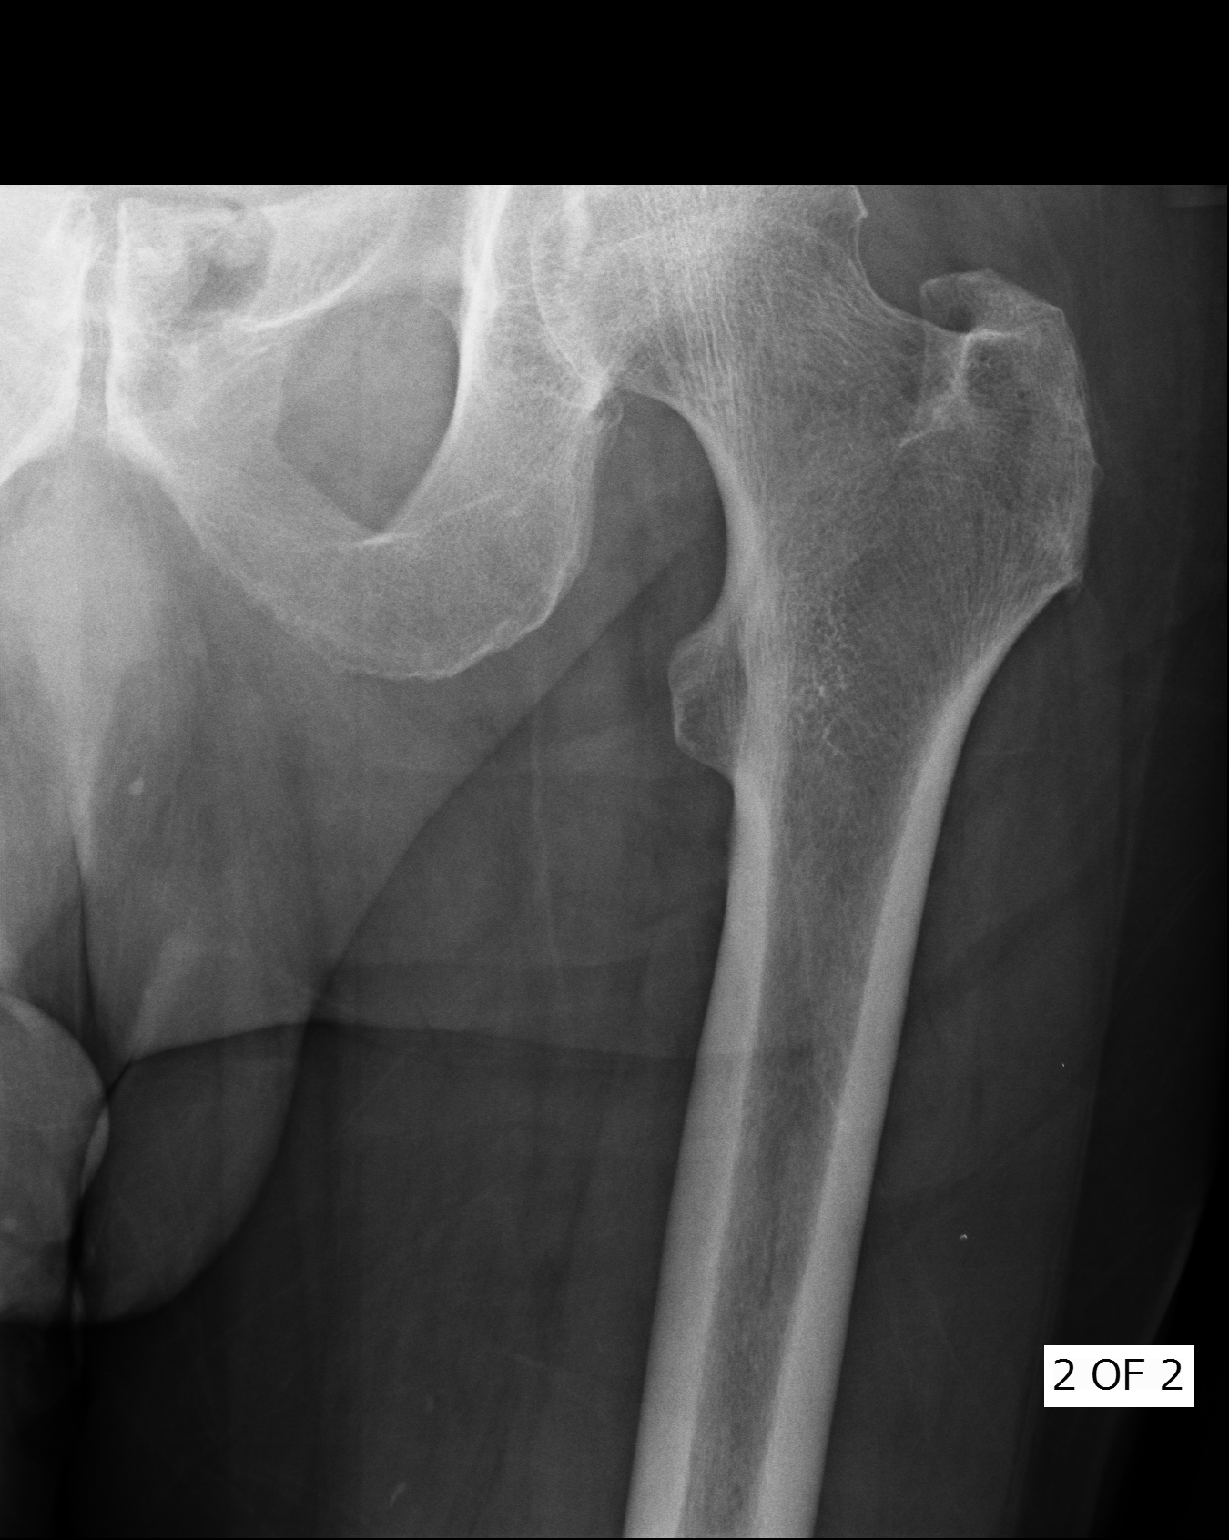

[3 of 3 positions shown; findings below may reference images not displayed]

PROCEDURE:     ANGELO ALEJANDRO - ANGELO ALEJANDRO HIP LEFT COMPLETE  - November 25, 2012 [DATE]

RESULT:     AP and lateral views of the left hip reveal the bones to be
mildly osteopenic. There is no lytic or blastic lesion. The hip joint space
is preserved. The femoral head remains smoothly rounded. The overlying soft
tissues are grossly normal for age.
IMPRESSION: There is no acute bony abnormality of the left hip nor
evidence of significant degenerative change.

[REDACTED]

## 2014-08-02 DIAGNOSIS — L570 Actinic keratosis: Secondary | ICD-10-CM | POA: Diagnosis not present

## 2014-08-02 DIAGNOSIS — L821 Other seborrheic keratosis: Secondary | ICD-10-CM | POA: Diagnosis not present

## 2014-08-02 DIAGNOSIS — D239 Other benign neoplasm of skin, unspecified: Secondary | ICD-10-CM | POA: Diagnosis not present

## 2014-08-02 DIAGNOSIS — D1801 Hemangioma of skin and subcutaneous tissue: Secondary | ICD-10-CM | POA: Diagnosis not present

## 2014-08-02 DIAGNOSIS — L820 Inflamed seborrheic keratosis: Secondary | ICD-10-CM | POA: Diagnosis not present

## 2014-08-02 DIAGNOSIS — L819 Disorder of pigmentation, unspecified: Secondary | ICD-10-CM | POA: Diagnosis not present

## 2014-09-11 DIAGNOSIS — Z23 Encounter for immunization: Secondary | ICD-10-CM | POA: Diagnosis not present

## 2014-10-30 DIAGNOSIS — Z0000 Encounter for general adult medical examination without abnormal findings: Secondary | ICD-10-CM | POA: Diagnosis not present

## 2014-10-30 DIAGNOSIS — H449 Unspecified disorder of globe: Secondary | ICD-10-CM | POA: Diagnosis not present

## 2014-10-30 DIAGNOSIS — E039 Hypothyroidism, unspecified: Secondary | ICD-10-CM | POA: Diagnosis not present

## 2014-10-30 DIAGNOSIS — E785 Hyperlipidemia, unspecified: Secondary | ICD-10-CM | POA: Diagnosis not present

## 2014-10-30 DIAGNOSIS — I2510 Atherosclerotic heart disease of native coronary artery without angina pectoris: Secondary | ICD-10-CM | POA: Diagnosis not present

## 2014-10-30 DIAGNOSIS — J449 Chronic obstructive pulmonary disease, unspecified: Secondary | ICD-10-CM | POA: Diagnosis not present

## 2015-02-07 DIAGNOSIS — H35373 Puckering of macula, bilateral: Secondary | ICD-10-CM | POA: Diagnosis not present

## 2015-03-27 NOTE — Consult Note (Signed)
Brief Consult Note: Diagnosis: GI bleeding.   Patient was seen by consultant.   Consult note dictated.   Recommend further assessment or treatment.   Discussed with Attending MD.   Comments: Please see full GI consult #[id number].  Patietn presenting with melena.  DDx includes upper (nsaid related ulcer or gastropathy) as well as lower (diverticular/avm, etc)  Not recurrent.  No fresh bleeding since admission.  Hemodynamically stable.  Recommend ICU obs through at least late PM.  EGD when clinically feasible.  If there is recurrent fresh bleeding or significant drop of hgb, would proceed with bleeding scan versus egd depending on clinical presentation.  Continue ppi drip.  Serial hgb, transfuse as necessary.  Following.  Electronic Signatures: Loistine Simas (MD)  (Signed 26-Oct-13 16:42)  Authored: Brief Consult Note   Last Updated: 26-Oct-13 16:42 by Loistine Simas (MD)

## 2015-03-27 NOTE — H&P (Signed)
PATIENT NAME:  Jeffrey Haynes, Jeffrey Haynes MR#:  [id number] DATE OF BIRTH:  12/14/1924  DATE OF ADMISSION:  10/02/2012  REFERRING PHYSICIAN: Dr. Charlesetta Ivory  PRIMARY CARE PHYSICIAN: Dr. Golden Pop    CHIEF COMPLAINT: Syncope.   HISTORY OF PRESENT ILLNESS: This is an 81 year old male with significant past medical history of arthritis, hyperlipidemia, hypertension, hypothyroidism, presents with complaints of syncope. Patient reports he lives with his son. Patient reports at night he went to the kitchen, where he felt lightheadedness, and then he fell on the floor. He describes it as felt the light going out. Upon falling on the floor, his son heard him fall on the floor and he saw him where he called EMS. Patient regained consciousness back to his baseline. There is no seizure-like activity. No chest pain, no shortness of breath, no palpitations. In ED patient was severely orthostatic, could not be standing up to check orthostatics but was orthostatic from supine to sitting. Patient reports upon falling he fell on his left chest where he complains of musculoskeletal chest pain in that area. Patient's CT brain did not show an acute finding.   Patient reports he has been having melena for the last night. He reports he had two episodes at home, as well he had one episode here in Emergency Department. Patient's hemoglobin is 10.9. There is no baseline. Patient reports he is taking 325 of aspirin daily, as well reports because of his knee pain he was recently prescribed naproxen 500 mg p.o. b.i.d. over the last week. Patient denies any coffee-ground emesis, any bright red blood per rectum any chest pain, or any shortness of breath. Initial reading of the rib cage x-ray showing less than 1% of pneumothorax. Patient was started on IV Protonix drip in ED.    PAST MEDICAL HISTORY:  1. Hyperlipidemia.  2. Hypertension.  3. Hypothyroidism.   PAST SURGICAL HISTORY:  1. History of neck surgery.  2. History of abdominal  hernia repair.   ALLERGIES: No known drug allergies.   HOME MEDICATIONS:  1. Aspirin 325 mg oral daily.  2. Naproxen 500 mg oral 2 times a day.  3. Lipitor 20 mg oral at bedtime.  4. Metoprolol succinate extended release 50 mg oral daily.  5. Ventolin inhalation two sprays 2 times a day.  6. Synthroid 75 mg oral daily.   SOCIAL HISTORY: Patient lives at home with his son. Currently nonsmoker, history of remote smoking. No alcohol or substance abuse.   FAMILY HISTORY: Significant for colon cancer in both of his sisters.   REVIEW OF SYSTEMS: CONSTITUTIONAL: Patient denies any fever, fatigue, or weakness. EYES: Denies blurry vision, double vision, or pain. ENT: Denies tinnitus, ear pain, hearing loss. RESPIRATORY: Denies cough, wheezing, hemoptysis, dyspnea. CARDIOVASCULAR: Denies any chest pain, edema, arrhythmia, palpitation. Had episode of syncope. GASTROINTESTINAL: Denies any nausea, vomiting, diarrhea. Patient reports he had constipation, had melena one in ED. GENITOURINARY: Denies dysuria, hematuria, renal colic. ENDO: Denies polyuria, polydipsia, heat or cold intolerance. Has hypothyroidism. HEMATOLOGY: Denies any history of anemia, easy bruising, or history of bleeding diathesis. MUSCULOSKELETAL: Has complaints of arthritis and knee pain. Denies any gout, redness or swelling. INTEGUMENTARY: Denies any acne or rash. NEURO: Denies any numbness, dysarthria, epilepsy, tremors, ataxia, dementia, headaches. PSYCHIATRIC: Denies anxiety, schizophrenia, nervousness, alcohol or substance abuse.   PHYSICAL EXAMINATION:  VITAL SIGNS: Temperature 97.5, pulse 61, respiratory rate 18, blood pressure 113/58, saturating 99% on room air. Patient was orthostatic from supine to sitting position.   GENERAL: Frail, elderly  male, looks comfortable in no apparent distress.   HEENT: Head atraumatic, normocephalic. Pupils equal, reactive to light. Pink conjunctivae. Anicteric sclerae. Moist oral mucosa.   NECK:  Supple. No thyromegaly. No JVD.   CHEST: Good air entry bilaterally. No wheezing, rales, rhonchi.   CARDIOVASCULAR: S1, S2 heard. No rubs, murmur, gallops.   ABDOMEN: Soft, nontender, nondistended. Bowel sounds present.   EXTREMITIES: No edema. No clubbing. No cyanosis.   PSYCHIATRIC: Appropriate affect. Awake, alert x3. Intact judgment and insight.   NEUROLOGICAL: Cranial nerves grossly intact. Motor 5/5 in all extremities.   SKIN: Normal skin turgor. Warm and dry.   CHEST WALL: Patient had tenderness to palpation in the left side of the chest at the ribcage area.    LABORATORY, DIAGNOSTIC AND RADIOLOGICAL DATA: Glucose 85, creatinine 1.04, sodium 145, potassium 4.3, chloride 112, CO2 26, anion gap 7, troponin less than 0.02, white blood cells 8.8, hemoglobin 10.9, hematocrit 32.3, platelets 153. Urinalysis negative for leukocyte esterase and nitrite.   CT brain does not show any acute findings.   ASSESSMENT AND PLAN: This is an 81 year old male who presents with syncope with orthostatic in ED, as well patient had melena, most likely related to upper GI bleed from NSAIDs and aspirin use. Patient had left-sided chest tenderness most likely related to rib fracture from fall. Awaiting on the x-ray results as well. Patient initial report showing less than 1% pneumothorax.  1. Syncope. This is due to GI bleed. Patient is orthostatic, will continue with fluids and will hold his metoprolol.  2. Melena. This is most likely related to upper GI bleed from aspirin and naproxen use. Patient will be started on Protonix drip. Will check his hemoglobin and hematocrit every six hours. Already typed and screened, will recommend for transfusion for hemoglobin less than 8. Already discussed with Dr. Gustavo Lah. Patient will be given 80 mg of Protonix bolus then will be on 10 mL/h on Protonix drip. Will be admitted to the Critical Care Unit, kept n.p.o.  3. Hypotension secondary to orthostatics. Will hold his  medication.  4. Hypothyroidism. Continue with Synthroid.  5. Hyperlipidemia. Will resume statin when more stable.  6. Left chest tenderness, possible rib fracture. Will follow on the x-ray.  7. Evidence of pneumothorax less than 1%. Will check chest x-ray in six hours, as well will repeat in a.m. If continues to progress will consult surgery.  8. Deep vein thrombosis prophylaxis. Sequential compression device. No chemical anticoagulation secondary to GI bleed.  9. CODE STATUS: Patient is FULL CODE.   TOTAL TIME SPENT ON ADMISSION AND PATIENT CARE: 60 minutes.  ____________________________ Albertine Patricia, MD dse:cms D: 10/02/2012 99:24:26 ET T: 10/02/2012 10:27:17 ET JOB#: [id number]  cc: Albertine Patricia, MD, <Dictator> [name], MD  Paiden Cavell Graciela Husbands MD ELECTRONICALLY SIGNED 10/08/2012 2:01

## 2015-03-27 NOTE — Consult Note (Signed)
Chief Complaint:   Subjective/Chief Complaint seen for melena/GI bleed.  denies abdominal apin or nausea.  one small bm this am, 2 this pm noted as black.  hemodynamically stable.   VITAL SIGNS/ANCILLARY NOTES: **Vital Signs.:   27-Oct-13 07:33   Vital Signs Type Routine   Temperature Temperature (F) 98   Celsius 36.6   Temperature Source axillary   Pulse Pulse 67   Respirations Respirations 22   Systolic BP Systolic BP 681   Diastolic BP (mmHg) Diastolic BP (mmHg) 69   Mean BP 89   Pulse Ox % Pulse Ox % 96   Pulse Ox Activity Level  At rest   Oxygen Delivery Room Air/ 21 %    13:20   Vital Signs Type Routine   Temperature Temperature (F) 98.8   Celsius 37.1   Temperature Source axillary   Pulse Pulse 70   Respirations Respirations 22   Systolic BP Systolic BP 275   Diastolic BP (mmHg) Diastolic BP (mmHg) 69   Mean BP 91   Pulse Ox % Pulse Ox % 97   Pulse Ox Activity Level  At rest   Oxygen Delivery Room Air/ 21 %  *Intake and Output.:   27-Oct-13 06:36   Stool  small loose bloody stool    12:50   Stool  small loose dark red stool    13:44   Stool  small loose black    17:36   Stool  small loose black stool   Brief Assessment:   Cardiac Regular    Respiratory clear BS    Gastrointestinal details normal Soft  Nontender  Nondistended  No masses palpable  Bowel sounds normal    Additional Physical Exam DRE- black stool, not maroon as seen yesterday.  watery.   Lab Results:  Hepatic:  27-Oct-13 05:18    Bilirubin, Total  1.4   Alkaline Phosphatase 60   SGPT (ALT) 13   SGOT (AST) 19   Total Protein, Serum  5.0   Albumin, Serum  2.8  Routine Chem:  27-Oct-13 05:18    BUN  31   Glucose, Serum 67   Creatinine (comp) 0.88   Sodium, Serum 145   Potassium, Serum 3.8   Chloride, Serum  114   CO2, Serum 22   Calcium (Total), Serum  7.6   Osmolality (calc) 293   eGFR (African American) >60   eGFR (Non-African American) >60 (eGFR values <24m/min/1.73 m2 may  be an indication of chronic kidney disease (CKD). Calculated eGFR is useful in patients with stable renal function. The eGFR calculation will not be reliable in acutely ill patients when serum creatinine is changing rapidly. It is not useful in  patients on dialysis. The eGFR calculation may not be applicable to patients at the low and high extremes of body sizes, pregnant women, and vegetarians.)   Anion Gap 9    16:04    BUN  26 (Result(s) reported on 03 Oct 2012 at 05:38PM.)  Routine Coag:  27-Oct-13 05:18    Prothrombin  16.1   INR 1.3 (INR reference interval applies to patients on anticoagulant therapy. A single INR therapeutic range for coumarins is not optimal for all indications; however, the suggested range for most indications is 2.0 - 3.0. Exceptions to the INR Reference Range may include: Prosthetic heart valves, acute myocardial infarction, prevention of myocardial infarction, and combinations of aspirin and anticoagulant. The need for a higher or lower target INR must be assessed individually. Reference: The Pharmacology and Management  of the Vitamin K  antagonists: the seventh ACCP Conference on Antithrombotic and Thrombolytic Therapy. LUNGB.6184 Sept:126 (3suppl): [id number]. A HCT value >55% may artifactually increase the PT.  In one study,  the increase was an average of 25%. Reference:  "Effect on Routine and Special Coagulation Testing Values of Citrate Anticoagulant Adjustment in Patients with High HCT Values." American Journal of Clinical Pathology 2006;126:400-405.)  Routine Hem:  27-Oct-13 05:18    WBC (CBC) 6.4   RBC (CBC)  2.73   Hemoglobin (CBC)  8.9   Hematocrit (CBC)  26.9   Platelet Count (CBC)  119   MCV 98   MCH 32.5   MCHC 33.0   RDW 13.4   Neutrophil % 73.8   Lymphocyte % 18.2   Monocyte % 6.1   Eosinophil % 1.6   Basophil % 0.3   Neutrophil # 4.7   Lymphocyte # 1.2   Monocyte # 0.4   Eosinophil # 0.1   Basophil # 0.0 (Result(s)  reported on 03 Oct 2012 at 06:16AM.)    16:04    WBC (CBC) 5.5   RBC (CBC)  2.48   Hemoglobin (CBC)  8.2   Hematocrit (CBC)  24.2   Platelet Count (CBC)  112   MCV 97   MCH 33.2   MCHC 34.1   RDW 13.7   Neutrophil % 66.7   Lymphocyte % 24.1   Monocyte % 7.1   Eosinophil % 1.6   Basophil % 0.5   Neutrophil # 3.7   Lymphocyte # 1.3   Monocyte # 0.4   Eosinophil # 0.1   Basophil # 0.0 (Result(s) reported on 03 Oct 2012 at 04:16PM.)   Assessment/Plan:  Assessment/Plan:   Assessment 1) GI bleed. several small volume black stools today, with slight improvement of bun.  Porbable upper GI bleeding, possibly minimally active, though some drop of hgb from this am.  2) abnormal PT, slight elevation of bili-?possible occult liver disease.    Plan 1) will arrange for egd today with anesthesia assistance. will give some vit K.  I have discussed the risks benefits and complicatiosn of egd to include not limited to bleeding infection perforation and sedation and he wishes to proceed.  Further recs to follow.   Electronic Signatures for Addendum Section:  Loistine Simas (MD) (Signed Addendum 27-Oct-13 18:36)  case discussed with anesthesia.  Will need to wait 3 hours from last po intake.  Will recheck labs at that time before proceeding.   Electronic Signatures: Loistine Simas (MD)  (Signed 27-Oct-13 18:09)  Authored: Chief Complaint, VITAL SIGNS/ANCILLARY NOTES, Brief Assessment, Lab Results, Assessment/Plan   Last Updated: 27-Oct-13 18:36 by Loistine Simas (MD)

## 2015-03-27 NOTE — Consult Note (Signed)
Chief Complaint:   Subjective/Chief Complaint Seeing for melena.  didnt sleep well, one minimal bm overnight.  no nausea or abdominal pain.   VITAL SIGNS/ANCILLARY NOTES: **Vital Signs.:   28-Oct-13 03:00   Vital Signs Type Q 4hr   Temperature Temperature (F) 97.8   Celsius 36.5   Temperature Source oral   Pulse Pulse 58   Respirations Respirations 20   Systolic BP Systolic BP 004   Diastolic BP (mmHg) Diastolic BP (mmHg) 68   Mean BP 88   Pulse Ox % Pulse Ox % 95   Pulse Ox Activity Level  At rest   Oxygen Delivery Room Air/ 21 %; Trach Aerosol Mask   Brief Assessment:   Cardiac Regular    Respiratory clear BS    Gastrointestinal details normal Soft  Nontender  Nondistended  No masses palpable  Bowel sounds normal   Lab Results:  Routine Chem:  27-Oct-13 20:01    BUN  24 (Result(s) reported on 03 Oct 2012 at 08:26PM.)  Routine Hem:  27-Oct-13 20:01    Hemoglobin (CBC)  8.4 (Result(s) reported on 03 Oct 2012 at 08:16PM.)   Assessment/Plan:  Assessment/Plan:   Assessment 1) melena- some decline of hgb over the weekend, with several small volume stools now black not red.    Plan 1) egd today.  I have discussed the risks benefits and complications of egd to include not limited to bleeding infection perforation and sedation and he wishes to proceed.  2) awaiting labs.   Electronic Signatures for Addendum Section:  Loistine Simas (MD) (Signed Addendum 28-Oct-13 09:12)  labs stable overnight, will proceed with EGD.  I have discussed the risks benefits and complications of egd to include not limited to bleeding infection perforation and sedation and he wishes to proceed.  Further recs to follow.   Electronic Signatures: Loistine Simas (MD)  (Signed 28-Oct-13 07:52)  Authored: Chief Complaint, VITAL SIGNS/ANCILLARY NOTES, Brief Assessment, Lab Results, Assessment/Plan   Last Updated: 28-Oct-13 09:12 by Loistine Simas (MD)

## 2015-03-27 NOTE — Discharge Summary (Signed)
PATIENT NAME:  Jeffrey Haynes, Jeffrey Haynes MR#:  [id number] DATE OF BIRTH:  Jun 11, 1925  DATE OF ADMISSION:  10/02/2012 DATE OF DISCHARGE:  10/05/2012  CONSULTANT: Dr. Gustavo Lah, gastroenterology.   PRIMARY CARE PHYSICIAN: Dr. Jeananne Rama.   CHIEF COMPLAINT: Syncope.   DISCHARGE DIAGNOSES:  1. Acute hemorrhagic anemia from upper gastrointestinal bleed from erosive gastritis.  2. Status post esophagogastroduodenoscopy showing gastric ulcers.  3. Syncope from orthostatic hypotension.  4. Chronic obstructive pulmonary disease.  5. Hypertension.  6. Hypothyroidism. 7. Hyperlipidemia.    DISCHARGE MEDICATIONS:  1. Protonix 40 mg p.o. twice a day.  2. Metoprolol succinate extended-release 50 mg once a day. 3. Lipitor 20 mg daily.  4. Synthroid 75 mcg once a day. 5. Albuterol CFC free 90 mcg inhaled 2 puffs 4 times a day as needed for wheezing.  6. Spiriva 18 micrograms inhaled one cap daily.  7. Please stop taking Naprosyn and aspirin.   DIET: Low sodium.   ACTIVITY: As tolerated.   DISCHARGE FOLLOWUP:  1. Please follow with your primary care physician for a CBC check within a week.  2. Please follow with Dr. Marton Redwood office within 1 to 2 weeks. 3. Please undergo pulmonary function tests with your primary care physician. 4. For further symptoms of dizziness when standing up, shortness of breath, pains in the chest or bleeding in your gastrointestinal system, please call your doctor right away.   DISPOSITION: Home.   CODE STATUS: FULL CODE.   HISTORY OF PRESENT ILLNESS: For full details of history and physical, please see the dictation on 10/02/2012 by Dr. Waldron Labs, but briefly, this is an 81 year old gentleman with chronic pain who has been on aspirin and Naprosyn who presented with syncope and was noted to have significant orthostatic hypotension and has been having melena. He was admitted to the hospitalist service and started on Protonix drip. He was noted to have a hemoglobin of 10.9 on  arrival.   SIGNIFICANT LABS AND IMAGING: Initial hemoglobin 10.9, hematocrit 32.3, lowest hemoglobin of 8.2 yesterday and today hemoglobin is 8.8. WBC today is 5, on arrival was 8.8.  Troponin negative x1. BUN on arrival 44, creatinine 1.04. Urinalysis not suggestive of infection. INR on 10/27 was 1.3. CT of head without contrast: Involutional and chronic changes without evidence of acute abnormalities. CT of abdomen and pelvis with contrast showing findings representing trace amount of pericholecystic fluid. Diverticulosis within the sigmoid colon. Trace pneumothorax possibly. X-rays of the chest on 10/27: Findings representing component of pulmonary fibrosis without pneumothorax. X-ray of the chest 10/26 again showing no evidence of actual pneumothorax. X-ray of the left rib unilateral: No osseous abnormalities. H pylori serology results are pending, but IgM is within normal limits.   HOSPITAL COURSE: The patient was admitted to the hospitalist service. The patient did have a syncopal episode and a fall and left-sided trunk pain and underwent x-rays of the chest as well as CT scan. The CT scan shows possible pneumothorax, however, x-rays did not show the pneumothorax. In regards to the syncope, this was likely secondary to orthostatic hypotension and acute hemorrhagic anemia and upper gastrointestinal bleed. The patient was started on Protonix drip and his NSAIDs were held. The patient had been on Naprosyn as well as aspirin. His daughter had stated that he had accidentally taken full dose aspirin for a couple of months where he was supposed to take a baby aspirin. He is on Naprosyn chronically and that has been increased to twice a day recently for continued pain. Gastroenterology was  consulted and the patient was seen by Dr. Gustavo Lah. He underwent an esophagogastroduodenoscopy on 10/28. Esophagogastroduodenoscopy showed severe erosive gastritis with multiple nonobstructing nonbleeding cratered gastric ulcers  of moderate to significant severity with no stigmata of bleeding. Furthermore, the largest lesion was about 7 mm. The patient was also known to have a nonobstructing widely patent Schatzki ring. At this point, his melena has tapered off and his hemoglobin has trended down and is going back up and his dizziness has resolved. He was also on IV fluids gently. His H pylori serology is still pending. While hospitalized, his beta blocker had been held, but it will be resumed upon discharge. The patient was noted to have some wheezing as well and was only on Ventolin for multiple years without any long-acting inhalers. Here, he was started on Spiriva and strongly recommended to follow his primary care physician and undergo pulmonary function tests. He was also told to follow with his primary care physician and undergo CBC testing within the next week or so.  CODE STATUS: The patient is FULL CODE.  TOTAL TIME SPENT: 35 minutes.   ____________________________ Vivien Presto, MD sa:ap D: 10/05/2012 13:24:14 ET T: 10/05/2012 13:39:30 ET JOB#: [id number]  cc: Vivien Presto, MD, <Dictator> [name], MD Lollie Sails, MD Vivien Presto MD ELECTRONICALLY SIGNED 10/08/2012 12:46

## 2015-03-27 NOTE — Consult Note (Signed)
PATIENT NAME:  Jeffrey Haynes, Jeffrey Haynes MR#:  [id number] DATE OF BIRTH:  10/03/1925  DATE OF CONSULTATION:  10/02/2012  REFERRING PHYSICIAN:  Dr. Waldron Labs  CONSULTING PHYSICIAN:  Lollie Sails, MD  REASON FOR CONSULTATION: GI bleed.   HISTORY OF PRESENT ILLNESS: Jeffrey Haynes is a pleasant 81 year old Caucasian male who came to the Emergency Room early this morning with complaint of rectal bleeding. He states that this past Tuesday his chronic knee pain became worse and he went to see his primary doctor. He had been taking one Naprosyn on a regular basis, however, this was increased to two Naprosyn daily and he states he also added himself a 325 mg aspirin a day. Then later on this week he became constipated for several days and had found himself straining very much at the stool to have a bowel movement. He stated that he had several bowel movements yesterday afternoon. This was then followed by a dark bloody stool at about 7:30 p.m., repeated again at about 11:00 p.m. He became lightheaded and passed out. At around 2:30 a.m. was brought to the Emergency Room. He states that he has not been having any problems with nausea, vomiting, or abdominal pain. There is no heartburn although there is some occasional dysphagia. He has not had to regurgitate food. He has no previous history of peptic ulcer disease. He has had two colonoscopies since 2002 both of these showing diverticulosis without evidence of diverticulitis. He has had colon polyps in the past, his last colonoscopy being in May 2006. Patient states that his last bowel movement was in the Emergency Room at about 6:00 a.m. this morning. GI family history is pertinent for two sisters and a brother with colon cancer, daughter with peptic ulcer disease. He denies any history of liver disease. Patient is now in the Intensive Care Unit. He has been hemodynamically stable with no evidence of ongoing/active gastrointestinal bleed.   PAST MEDICAL HISTORY:   1. Hypertension. 2. Hypothyroidism. 3. Hyperlipidemia.  4. History of arthritis.  5. History of neck surgery.  6. History of abdominal hernia repair.   OUTPATIENT MEDICATIONS:  1. 325 mg aspirin once a day. 2. Lipitor 20 mg once a day. 3. Metoprolol succinate ER 50 once a day. 4. Naprosyn 500 mg 2 daily. 5. Synthroid 75 mcg once a day. 6. Ventolin 2 sprays inhaled twice a day.   ALLERGIES: There are no known drug allergies.   PHYSICAL EXAMINATION:  VITAL SIGNS: Temperature 98.1, pulse 62, respirations 21, blood pressure 119/60, pulse oximetry 92%.   GENERAL: He is an 81 year old Caucasian male no acute distress.   HEENT: Normocephalic, atraumatic.   EYES: Anicteric.   NOSE: Septum midline. No lesions.   OROPHARYNX: No lesions.   NECK: Supple. No JVD. No lymphadenopathy. No thyromegaly.   HEART: Regular rate and rhythm.   LUNGS: Clear.   ABDOMEN: Soft, nontender, nondistended. Bowel sounds positive, normoactive.   RECTAL: Anorectal examination shows a very dark, near black maroonish stool/effluent without evidence of actual stool. The effluent is slimy in nature. There is no abnormality on palpation of the anal canal.   EXTREMITIES: No clubbing, cyanosis, edema.   NEUROLOGICAL: Cranial nerves II through XII grossly intact. Muscle strength bilaterally equal and symmetric, 5/5. Deep tendon reflexes bilaterally equal and symmetric.   LABORATORY, DIAGNOSTIC, AND RADIOLOGICAL DATA: Laboratories include the following. On admission to the hospital he had glucose 85, BUN 44, creatinine 1.04, sodium 145, potassium 4.3, chloride 112, bicarbonate 26, calcium 8.0. His hemogram showed  white count 8.8, hemoglobin and hematocrit 10.9/32.3, platelet count 153. Those labs were timed at 03:56 this morning. A repeat hemoglobin at 11:38 this morning showed it at 9.3 following rehydration. Urinalysis was negative. He had a CT scan of the head due to his fall/syncopal episode this showing  involutional changes. No evidence of acute abnormalities. He did also have a CT scan of the abdomen and pelvis with contrast this showing a trace amount of pericholecystic fluid. Diverticulosis in the sigmoid colon and a finding which "may represent trace pneumothorax or possibly extrapleural air", this being described as a very small lenticular sliver of air projecting along the anterior aspect of the left lung base.   ASSESSMENT: Gastrointestinal bleed. Differential diagnosis would include peptic ulcer disease secondary to NSAID use as well as diverticular bleeding. Patient is hemodynamically stable and has not had any recurrent bleeding since coming to the hospital.   RECOMMENDATIONS:  1. Continue IV PPI drip.  2. Continue ice chips.  3. Serial hemoglobin, transfuse as needed.  4. Should patient have an episode of acute bleeding may need to proceed with EGD sooner, however, I would obtain a GI bleeding scan to help the differential in terms of whether this could possibly be lower versus upper bleeding.  5. EGD when clinically feasible.  6. Again, should he show evidence of increased upper GI bleed would proceed with EGD sooner, however, otherwise will do this on Monday. Following with you.  ____________________________ Lollie Sails, MD mus:cms D: 10/02/2012 16:38:55 ET T: 10/03/2012 07:51:59 ET JOB#: [id number]  cc: Lollie Sails, MD, <Dictator> Lollie Sails MD ELECTRONICALLY SIGNED 10/07/2012 11:21

## 2015-04-09 DIAGNOSIS — E785 Hyperlipidemia, unspecified: Secondary | ICD-10-CM | POA: Diagnosis not present

## 2015-04-09 DIAGNOSIS — I2510 Atherosclerotic heart disease of native coronary artery without angina pectoris: Secondary | ICD-10-CM | POA: Diagnosis not present

## 2015-04-09 DIAGNOSIS — J449 Chronic obstructive pulmonary disease, unspecified: Secondary | ICD-10-CM | POA: Diagnosis not present

## 2015-06-29 DIAGNOSIS — T07 Unspecified multiple injuries: Secondary | ICD-10-CM | POA: Diagnosis not present

## 2015-06-29 DIAGNOSIS — L0390 Cellulitis, unspecified: Secondary | ICD-10-CM | POA: Diagnosis not present

## 2015-07-02 DIAGNOSIS — T07 Unspecified multiple injuries: Secondary | ICD-10-CM | POA: Diagnosis not present

## 2015-07-02 DIAGNOSIS — L0390 Cellulitis, unspecified: Secondary | ICD-10-CM | POA: Diagnosis not present

## 2015-07-03 DIAGNOSIS — L0390 Cellulitis, unspecified: Secondary | ICD-10-CM | POA: Diagnosis not present

## 2015-07-03 DIAGNOSIS — T07 Unspecified multiple injuries: Secondary | ICD-10-CM | POA: Diagnosis not present

## 2015-07-05 DIAGNOSIS — T07 Unspecified multiple injuries: Secondary | ICD-10-CM | POA: Diagnosis not present

## 2015-07-05 DIAGNOSIS — L0390 Cellulitis, unspecified: Secondary | ICD-10-CM | POA: Diagnosis not present

## 2015-07-07 DIAGNOSIS — L0390 Cellulitis, unspecified: Secondary | ICD-10-CM | POA: Diagnosis not present

## 2015-09-19 DIAGNOSIS — Z23 Encounter for immunization: Secondary | ICD-10-CM | POA: Diagnosis not present

## 2015-11-06 ENCOUNTER — Ambulatory Visit (INDEPENDENT_AMBULATORY_CARE_PROVIDER_SITE_OTHER): Payer: Medicare Other | Admitting: Family Medicine

## 2015-11-06 ENCOUNTER — Encounter: Payer: Self-pay | Admitting: Family Medicine

## 2015-11-06 VITALS — BP 131/73 | HR 60 | Temp 97.9°F | Ht 67.3 in | Wt 184.0 lb

## 2015-11-06 DIAGNOSIS — J42 Unspecified chronic bronchitis: Secondary | ICD-10-CM | POA: Diagnosis not present

## 2015-11-06 DIAGNOSIS — N183 Chronic kidney disease, stage 3 unspecified: Secondary | ICD-10-CM | POA: Insufficient documentation

## 2015-11-06 DIAGNOSIS — Z0000 Encounter for general adult medical examination without abnormal findings: Secondary | ICD-10-CM

## 2015-11-06 DIAGNOSIS — E785 Hyperlipidemia, unspecified: Secondary | ICD-10-CM | POA: Insufficient documentation

## 2015-11-06 DIAGNOSIS — J449 Chronic obstructive pulmonary disease, unspecified: Secondary | ICD-10-CM | POA: Insufficient documentation

## 2015-11-06 DIAGNOSIS — G250 Essential tremor: Secondary | ICD-10-CM | POA: Diagnosis not present

## 2015-11-06 DIAGNOSIS — I2510 Atherosclerotic heart disease of native coronary artery without angina pectoris: Secondary | ICD-10-CM

## 2015-11-06 DIAGNOSIS — E039 Hypothyroidism, unspecified: Secondary | ICD-10-CM | POA: Insufficient documentation

## 2015-11-06 LAB — URINALYSIS, ROUTINE W REFLEX MICROSCOPIC
Bilirubin, UA: NEGATIVE
Glucose, UA: NEGATIVE
Ketones, UA: NEGATIVE
Leukocytes, UA: NEGATIVE
Nitrite, UA: NEGATIVE
PH UA: 5 (ref 5.0–7.5)
Protein, UA: NEGATIVE
RBC UA: NEGATIVE
SPEC GRAV UA: 1.02 (ref 1.005–1.030)
Urobilinogen, Ur: 0.2 mg/dL (ref 0.2–1.0)

## 2015-11-06 MED ORDER — ATORVASTATIN CALCIUM 20 MG PO TABS: 20.0000 mg | ORAL_TABLET | Freq: Every day | ORAL | Status: DC

## 2015-11-06 MED ORDER — IPRATROPIUM-ALBUTEROL 20-100 MCG/ACT IN AERS: 2.0000 | INHALATION_SPRAY | Freq: Two times a day (BID) | RESPIRATORY_TRACT | Status: AC

## 2015-11-06 MED ORDER — ALBUTEROL SULFATE HFA 108 (90 BASE) MCG/ACT IN AERS: 2.0000 | INHALATION_SPRAY | Freq: Four times a day (QID) | RESPIRATORY_TRACT | Status: DC | PRN

## 2015-11-06 MED ORDER — METOPROLOL SUCCINATE ER 25 MG PO TB24: 25.0000 mg | ORAL_TABLET | Freq: Every day | ORAL | Status: DC

## 2015-11-06 MED ORDER — LEVOTHYROXINE SODIUM 75 MCG PO TABS: 75.0000 ug | ORAL_TABLET | Freq: Every day | ORAL | Status: DC

## 2015-11-06 NOTE — Addendum Note (Signed)
Addended byGolden Pop on: 11/06/2015 09:57 AM   Modules accepted: Miquel Dunn

## 2015-11-06 NOTE — Progress Notes (Addendum)
BP 131/73 mmHg  Pulse 60  Temp(Src) 97.9 F (36.6 C)  Ht 5' 7.3" (1.709 m)  Wt 184 lb (83.462 kg)  BMI 28.58 kg/m2  SpO2 98%   Subjective:    Patient ID: Jeffrey Haynes, male    DOB: June 13, 1925, 81 y.o.   MRN: [id number]  HPI: Jeffrey Haynes is a 81 y.o. male  Chief Complaint  Patient presents with  . Annual Exam   patient couple times year will have some struggles with breathing and coughing inhalers seem to help otherwise rarely uses inhalers A chest pain chest tightness no coronary artery disease symptoms Thyroid takes medications faithfully No problems with cholesterol medicine Tremor helped by metoprolol and seems to be stable  Relevant past medical, surgical, family and social history reviewed and updated as indicated. Interim medical history since our last visit reviewed. Allergies and medications reviewed and updated.  Review of Systems  Constitutional: Negative.   HENT: Negative.   Eyes: Negative.   Respiratory: Negative.   Cardiovascular: Negative.   Gastrointestinal: Negative.   Endocrine: Negative.   Genitourinary: Negative.   Musculoskeletal: Negative.   Skin: Negative.   Allergic/Immunologic: Negative.   Neurological: Negative.   Hematological: Negative.   Psychiatric/Behavioral: Negative.     Per HPI unless specifically indicated above     Objective:    BP 131/73 mmHg  Pulse 60  Temp(Src) 97.9 F (36.6 C)  Ht 5' 7.3" (1.709 m)  Wt 184 lb (83.462 kg)  BMI 28.58 kg/m2  SpO2 98%  Wt Readings from Last 3 Encounters:  11/06/15 184 lb (83.462 kg)  04/09/15 193 lb (87.544 kg)    Physical Exam  Constitutional: He is oriented to person, place, and time. He appears well-developed and well-nourished.  HENT:  Head: Normocephalic and atraumatic.  Right Ear: External ear normal.  Left Ear: External ear normal.  Eyes: Conjunctivae and EOM are normal. Pupils are equal, round, and reactive to light.  Neck: Normal range of motion. Neck supple.   Cardiovascular: Normal rate, regular rhythm, normal heart sounds and intact distal pulses.   Pulmonary/Chest: Effort normal and breath sounds normal.  Abdominal: Soft. Bowel sounds are normal. There is no splenomegaly or hepatomegaly.  Genitourinary: Rectum normal and penis normal.  No prostate exam at request of patient  Musculoskeletal: Normal range of motion.  Neurological: He is alert and oriented to person, place, and time. He has normal reflexes.  Skin: No rash noted. No erythema.  Psychiatric: He has a normal mood and affect. His behavior is normal. Judgment and thought content normal.        Assessment & Plan:   Problem List Items Addressed This Visit      Cardiovascular and Mediastinum   CAD (coronary artery disease)    Patient with no symptoms no complaints      Relevant Medications   atorvastatin (LIPITOR) 20 MG tablet   metoprolol succinate (TOPROL-XL) 25 MG 24 hr tablet   Other Relevant Orders   Comprehensive metabolic panel   CBC with Differential/Platelet   Urinalysis, Routine w reflex microscopic (not at [facility])     Respiratory   COPD (chronic obstructive pulmonary disease) ([place]) - Primary    The current medical regimen is effective;  continue present plan and medications.       Relevant Medications   albuterol (PROVENTIL HFA;VENTOLIN HFA) 108 (90 BASE) MCG/ACT inhaler   Ipratropium-Albuterol (COMBIVENT) 20-100 MCG/ACT AERS respimat   Other Relevant Orders   Comprehensive metabolic panel  CBC with Differential/Platelet   Urinalysis, Routine w reflex microscopic (not at [facility])     Endocrine   Hypothyroidism    The current medical regimen is effective;  continue present plan and medications.       Relevant Medications   levothyroxine (SYNTHROID, LEVOTHROID) 75 MCG tablet   metoprolol succinate (TOPROL-XL) 25 MG 24 hr tablet   Other Relevant Orders   Comprehensive metabolic panel   CBC with Differential/Platelet   TSH   Urinalysis, Routine w  reflex microscopic (not at [facility])     Nervous and Auditory   Familial tremor    The current medical regimen is effective;  continue present plan and medications.         Genitourinary   CKD (chronic kidney disease) stage 3, GFR 30-59 ml/min     Other   Hyperlipidemia    The current medical regimen is effective;  continue present plan and medications.       Relevant Medications   atorvastatin (LIPITOR) 20 MG tablet   metoprolol succinate (TOPROL-XL) 25 MG 24 hr tablet   Other Relevant Orders   Comprehensive metabolic panel   Lipid panel    Other Visit Diagnoses    PE (physical exam), annual            Follow up plan: Return in about 6 months (around 05/05/2016), or if symptoms worsen or fail to improve, for BMP, lipid panel, ALT, AST.

## 2015-11-06 NOTE — Assessment & Plan Note (Signed)
The current medical regimen is effective;  continue present plan and medications.  

## 2015-11-06 NOTE — Assessment & Plan Note (Signed)
Patient with no symptoms no complaints

## 2015-11-07 ENCOUNTER — Encounter: Payer: Self-pay | Admitting: Family Medicine

## 2015-11-07 LAB — CBC WITH DIFFERENTIAL/PLATELET
BASOS: 1 %
Basophils Absolute: 0 10*3/uL (ref 0.0–0.2)
EOS (ABSOLUTE): 0.1 10*3/uL (ref 0.0–0.4)
EOS: 2 %
HEMATOCRIT: 40.9 % (ref 37.5–51.0)
Hemoglobin: 13.6 g/dL (ref 12.6–17.7)
Immature Grans (Abs): 0 10*3/uL (ref 0.0–0.1)
Immature Granulocytes: 0 %
LYMPHS ABS: 1.7 10*3/uL (ref 0.7–3.1)
Lymphs: 27 %
MCH: 31.9 pg (ref 26.6–33.0)
MCHC: 33.3 g/dL (ref 31.5–35.7)
MCV: 96 fL (ref 79–97)
MONOS ABS: 0.5 10*3/uL (ref 0.1–0.9)
Monocytes: 8 %
NEUTROS ABS: 4 10*3/uL (ref 1.4–7.0)
Neutrophils: 62 %
Platelets: 191 10*3/uL (ref 150–379)
RBC: 4.26 x10E6/uL (ref 4.14–5.80)
RDW: 13.6 % (ref 12.3–15.4)
WBC: 6.5 10*3/uL (ref 3.4–10.8)

## 2015-11-07 LAB — COMPREHENSIVE METABOLIC PANEL
ALK PHOS: 93 IU/L (ref 39–117)
ALT: 22 IU/L (ref 0–44)
AST: 19 IU/L (ref 0–40)
Albumin/Globulin Ratio: 1.5 (ref 1.1–2.5)
Albumin: 3.5 g/dL (ref 3.5–4.7)
BUN/Creatinine Ratio: 12 (ref 10–22)
BUN: 12 mg/dL (ref 8–27)
Bilirubin Total: 0.9 mg/dL (ref 0.0–1.2)
CO2: 24 mmol/L (ref 18–29)
Calcium: 8.8 mg/dL (ref 8.6–10.2)
Chloride: 104 mmol/L (ref 97–106)
Creatinine, Ser: 0.98 mg/dL (ref 0.76–1.27)
GFR calc Af Amer: 79 mL/min/{1.73_m2} (ref >59)
GFR calc non Af Amer: 69 mL/min/{1.73_m2} (ref >59)
GLUCOSE: 83 mg/dL (ref 65–99)
Globulin, Total: 2.3 g/dL (ref 1.5–4.5)
Potassium: 4.1 mmol/L (ref 3.5–5.2)
Sodium: 142 mmol/L (ref 136–144)
TOTAL PROTEIN: 5.8 g/dL — AB (ref 6.0–8.5)

## 2015-11-07 LAB — LIPID PANEL
CHOLESTEROL TOTAL: 117 mg/dL (ref 100–199)
Chol/HDL Ratio: 3 ratio units (ref 0.0–5.0)
HDL: 39 mg/dL — AB (ref >39)
LDL Calculated: 57 mg/dL (ref 0–99)
Triglycerides: 105 mg/dL (ref 0–149)
VLDL CHOLESTEROL CAL: 21 mg/dL (ref 5–40)

## 2015-11-07 LAB — TSH: TSH: 0.659 u[IU]/mL (ref 0.450–4.500)

## 2016-02-14 ENCOUNTER — Encounter: Payer: Self-pay | Admitting: *Deleted

## 2016-03-10 ENCOUNTER — Encounter: Payer: Self-pay | Admitting: Family Medicine

## 2016-03-10 ENCOUNTER — Ambulatory Visit (INDEPENDENT_AMBULATORY_CARE_PROVIDER_SITE_OTHER): Payer: Medicare Other | Admitting: Family Medicine

## 2016-03-10 VITALS — BP 130/68 | HR 67 | Temp 98.9°F | Ht 67.3 in | Wt 187.0 lb

## 2016-03-10 DIAGNOSIS — J441 Chronic obstructive pulmonary disease with (acute) exacerbation: Secondary | ICD-10-CM

## 2016-03-10 MED ORDER — PREDNISONE 10 MG PO TABS: ORAL_TABLET | ORAL | Status: DC

## 2016-03-10 MED ORDER — AZITHROMYCIN 250 MG PO TABS: ORAL_TABLET | ORAL | Status: DC

## 2016-03-10 MED ORDER — ALBUTEROL SULFATE (2.5 MG/3ML) 0.083% IN NEBU: 2.5000 mg | INHALATION_SOLUTION | Freq: Once | RESPIRATORY_TRACT | Status: DC

## 2016-03-10 NOTE — Progress Notes (Signed)
BP 130/68 mmHg  Pulse 67  Temp(Src) 98.9 F (37.2 C)  Ht 5' 7.3" (1.709 m)  Wt 187 lb (84.823 kg)  BMI 29.04 kg/m2  SpO2 98%   Subjective:    Patient ID: Jeffrey Haynes, male    DOB: 23-Jan-1925, 82 y.o.   MRN: [id number]  HPI: Jeffrey Haynes is a 82 y.o. male  Chief Complaint  Patient presents with  . Cough    X 4 days, shortness of breath, cough, chest pain   UPPER RESPIRATORY TRACT INFECTION Duration: 4 days Worst symptom: cough Fever: no Cough: yes Shortness of breath: yes Wheezing: yes Chest pain: yes, with cough Chest tightness: yes Chest congestion: yes Nasal congestion: yes Runny nose: yes Post nasal drip: yes Sneezing: yes Sore throat: no Swollen glands: no Sinus pressure: no Headache: no Face pain: no Toothache: no Ear pain: no  Ear pressure: no  Eyes red/itching:no Eye drainage/crusting: no  Vomiting: yes Rash: no Fatigue: yes Sick contacts: yes Strep contacts: no  Context: worse Recurrent sinusitis: no Relief with OTC cold/cough medications: no  Treatments attempted: none   Relevant past medical, surgical, family and social history reviewed and updated as indicated. Interim medical history since our last visit reviewed. Allergies and medications reviewed and updated.  Review of Systems  Constitutional: Positive for fatigue. Negative for fever, chills, diaphoresis, activity change, appetite change and unexpected weight change.  HENT: Positive for congestion, postnasal drip, rhinorrhea, sinus pressure and sore throat. Negative for dental problem, drooling, ear discharge, ear pain, facial swelling, hearing loss, mouth sores, nosebleeds, sneezing, tinnitus, trouble swallowing and voice change.   Respiratory: Positive for cough, chest tightness, shortness of breath and wheezing. Negative for apnea, choking and stridor.   Cardiovascular: Negative.   Psychiatric/Behavioral: Negative.     Per HPI unless specifically indicated above     Objective:     BP 130/68 mmHg  Pulse 67  Temp(Src) 98.9 F (37.2 C)  Ht 5' 7.3" (1.709 m)  Wt 187 lb (84.823 kg)  BMI 29.04 kg/m2  SpO2 98%  Wt Readings from Last 3 Encounters:  03/10/16 187 lb (84.823 kg)  11/06/15 184 lb (83.462 kg)  04/09/15 193 lb (87.544 kg)    Physical Exam  Constitutional: He is oriented to person, place, and time. He appears well-developed and well-nourished. No distress.  HENT:  Head: Normocephalic and atraumatic.  Right Ear: Hearing and external ear normal.  Left Ear: Hearing and external ear normal.  Nose: Nose normal.  Mouth/Throat: Oropharynx is clear and moist. No oropharyngeal exudate.  Eyes: Conjunctivae, EOM and lids are normal. Pupils are equal, round, and reactive to light. Right eye exhibits no discharge. Left eye exhibits no discharge. No scleral icterus.  Neck: Normal range of motion. Neck supple. No JVD present. No tracheal deviation present. No thyromegaly present.  Cardiovascular: Normal rate, regular rhythm, normal heart sounds and intact distal pulses.  Exam reveals no gallop and no friction rub.   No murmur heard. Pulmonary/Chest: Effort normal. No stridor. No respiratory distress. He has wheezes. He has no rales. He exhibits no tenderness.  Musculoskeletal: Normal range of motion.  Lymphadenopathy:    He has cervical adenopathy.  Neurological: He is alert and oriented to person, place, and time.  Skin: Skin is warm, dry and intact. No rash noted. He is not diaphoretic. No erythema. No pallor.  Psychiatric: He has a normal mood and affect. His speech is normal and behavior is normal. Judgment and thought content normal. Cognition and  memory are normal.  Nursing note and vitals reviewed.   Results for orders placed or performed in visit on 11/06/15  Comprehensive metabolic panel  Result Value Ref Range   Glucose 83 65 - 99 mg/dL   BUN 12 8 - 27 mg/dL   Creatinine, Ser 0.98 0.76 - 1.27 mg/dL   GFR calc non Af Amer 69 >59 mL/min/1.73   GFR calc  Af Amer 79 >59 mL/min/1.73   BUN/Creatinine Ratio 12 10 - 22   Sodium 142 136 - 144 mmol/L   Potassium 4.1 3.5 - 5.2 mmol/L   Chloride 104 97 - 106 mmol/L   CO2 24 18 - 29 mmol/L   Calcium 8.8 8.6 - 10.2 mg/dL   Total Protein 5.8 (L) 6.0 - 8.5 g/dL   Albumin 3.5 3.5 - 4.7 g/dL   Globulin, Total 2.3 1.5 - 4.5 g/dL   Albumin/Globulin Ratio 1.5 1.1 - 2.5   Bilirubin Total 0.9 0.0 - 1.2 mg/dL   Alkaline Phosphatase 93 39 - 117 IU/L   AST 19 0 - 40 IU/L   ALT 22 0 - 44 IU/L  Lipid panel  Result Value Ref Range   Cholesterol, Total 117 100 - 199 mg/dL   Triglycerides 105 0 - 149 mg/dL   HDL 39 (L) >39 mg/dL   VLDL Cholesterol Cal 21 5 - 40 mg/dL   LDL Calculated 57 0 - 99 mg/dL   Chol/HDL Ratio 3.0 0.0 - 5.0 ratio units  CBC with Differential/Platelet  Result Value Ref Range   WBC 6.5 3.4 - 10.8 x10E3/uL   RBC 4.26 4.14 - 5.80 x10E6/uL   Hemoglobin 13.6 12.6 - 17.7 g/dL   Hematocrit 40.9 37.5 - 51.0 %   MCV 96 79 - 97 fL   MCH 31.9 26.6 - 33.0 pg   MCHC 33.3 31.5 - 35.7 g/dL   RDW 13.6 12.3 - 15.4 %   Platelets 191 150 - 379 x10E3/uL   Neutrophils 62 %   Lymphs 27 %   Monocytes 8 %   Eos 2 %   Basos 1 %   Neutrophils Absolute 4.0 1.4 - 7.0 x10E3/uL   Lymphocytes Absolute 1.7 0.7 - 3.1 x10E3/uL   Monocytes Absolute 0.5 0.1 - 0.9 x10E3/uL   EOS (ABSOLUTE) 0.1 0.0 - 0.4 x10E3/uL   Basophils Absolute 0.0 0.0 - 0.2 x10E3/uL   Immature Granulocytes 0 %   Immature Grans (Abs) 0.0 0.0 - 0.1 x10E3/uL  TSH  Result Value Ref Range   TSH 0.659 0.450 - 4.500 uIU/mL  Urinalysis, Routine w reflex microscopic (not at [facility])  Result Value Ref Range   Specific Gravity, UA 1.020 1.005 - 1.030   pH, UA 5.0 5.0 - 7.5   Color, UA Yellow Yellow   Appearance Ur Clear Clear   Leukocytes, UA Negative Negative   Protein, UA Negative Negative/Trace   Glucose, UA Negative Negative   Ketones, UA Negative Negative   RBC, UA Negative Negative   Bilirubin, UA Negative Negative   Urobilinogen, Ur  0.2 0.2 - 1.0 mg/dL   Nitrite, UA Negative Negative      Assessment & Plan:   Problem List Items Addressed This Visit    None    Visit Diagnoses    COPD exacerbation ([place])    -  Primary    Slightly better after neb. Will treat with prednisone and azithromycin. Recheck lungs in 2 weeks.     Relevant Medications    albuterol (PROVENTIL) (2.5 MG/3ML) 0.083% nebulizer  solution 2.5 mg    predniSONE (DELTASONE) 10 MG tablet    azithromycin (ZITHROMAX) 250 MG tablet        Follow up plan: Return in about 2 weeks (around 03/24/2016) for Lung recheck.

## 2016-03-25 ENCOUNTER — Encounter: Payer: Self-pay | Admitting: Family Medicine

## 2016-03-25 ENCOUNTER — Ambulatory Visit (INDEPENDENT_AMBULATORY_CARE_PROVIDER_SITE_OTHER): Payer: Medicare Other | Admitting: Family Medicine

## 2016-03-25 VITALS — BP 116/67 | HR 60 | Temp 98.0°F | Ht 67.7 in | Wt 189.0 lb

## 2016-03-25 DIAGNOSIS — G250 Essential tremor: Secondary | ICD-10-CM | POA: Diagnosis not present

## 2016-03-25 DIAGNOSIS — J42 Unspecified chronic bronchitis: Secondary | ICD-10-CM

## 2016-03-25 DIAGNOSIS — I2510 Atherosclerotic heart disease of native coronary artery without angina pectoris: Secondary | ICD-10-CM

## 2016-03-25 NOTE — Assessment & Plan Note (Signed)
Will observe tremor as metoprolol will help that we will see if reducing dose of exacerbates tremor

## 2016-03-25 NOTE — Assessment & Plan Note (Signed)
Improving wheezing will continue inhalers may be exacerbated by metoprolol will decrease dose to half a tablet

## 2016-03-25 NOTE — Progress Notes (Signed)
BP 116/67 mmHg  Pulse 60  Temp(Src) 98 F (36.7 C)  Ht 5' 7.7" (1.72 m)  Wt 189 lb (85.73 kg)  BMI 28.98 kg/m2  SpO2 97%   Subjective:    Patient ID: Jeffrey Haynes, male    DOB: 09-14-1925, 82 y.o.   MRN: [id number]  HPI: Jeffrey Haynes is a 82 y.o. male  Chief Complaint  Patient presents with  . lung recheck  Patient feeling better getting better breath but still feels kind of tired and draggy no PND orthopnea Still some slight cough but no wheezing has used inhalers and feels better all in all. On no noticed dizziness lightheaded especially with standing Blood pressures been good taking metoprolol 25 mg  Relevant past medical, surgical, family and social history reviewed and updated as indicated. Interim medical history since our last visit reviewed. Allergies and medications reviewed and updated.  Review of Systems  Constitutional: Positive for fatigue. Negative for fever, chills and diaphoresis.  Respiratory: Positive for cough and shortness of breath. Negative for apnea, choking, chest tightness, wheezing and stridor.   Cardiovascular: Negative for chest pain, palpitations and leg swelling.    Per HPI unless specifically indicated above     Objective:    BP 116/67 mmHg  Pulse 60  Temp(Src) 98 F (36.7 C)  Ht 5' 7.7" (1.72 m)  Wt 189 lb (85.73 kg)  BMI 28.98 kg/m2  SpO2 97%  Wt Readings from Last 3 Encounters:  03/25/16 189 lb (85.73 kg)  03/10/16 187 lb (84.823 kg)  11/06/15 184 lb (83.462 kg)    Physical Exam  Constitutional: He is oriented to person, place, and time. He appears well-developed and well-nourished. No distress.  HENT:  Head: Normocephalic and atraumatic.  Right Ear: Hearing normal.  Left Ear: Hearing normal.  Nose: Nose normal.  Eyes: Conjunctivae and lids are normal. Right eye exhibits no discharge. Left eye exhibits no discharge. No scleral icterus.  Cardiovascular: Regular rhythm and normal heart sounds.   Pulmonary/Chest: Effort  normal. No respiratory distress.  Slight wheeze with coughing  Musculoskeletal: Normal range of motion.  Neurological: He is alert and oriented to person, place, and time.  Skin: Skin is intact. No rash noted.  Psychiatric: He has a normal mood and affect. His speech is normal and behavior is normal. Judgment and thought content normal. Cognition and memory are normal.    Results for orders placed or performed in visit on 11/06/15  Comprehensive metabolic panel  Result Value Ref Range   Glucose 83 65 - 99 mg/dL   BUN 12 8 - 27 mg/dL   Creatinine, Ser 0.98 0.76 - 1.27 mg/dL   GFR calc non Af Amer 69 >59 mL/min/1.73   GFR calc Af Amer 79 >59 mL/min/1.73   BUN/Creatinine Ratio 12 10 - 22   Sodium 142 136 - 144 mmol/L   Potassium 4.1 3.5 - 5.2 mmol/L   Chloride 104 97 - 106 mmol/L   CO2 24 18 - 29 mmol/L   Calcium 8.8 8.6 - 10.2 mg/dL   Total Protein 5.8 (L) 6.0 - 8.5 g/dL   Albumin 3.5 3.5 - 4.7 g/dL   Globulin, Total 2.3 1.5 - 4.5 g/dL   Albumin/Globulin Ratio 1.5 1.1 - 2.5   Bilirubin Total 0.9 0.0 - 1.2 mg/dL   Alkaline Phosphatase 93 39 - 117 IU/L   AST 19 0 - 40 IU/L   ALT 22 0 - 44 IU/L  Lipid panel  Result Value Ref Range  Cholesterol, Total 117 100 - 199 mg/dL   Triglycerides 105 0 - 149 mg/dL   HDL 39 (L) >39 mg/dL   VLDL Cholesterol Cal 21 5 - 40 mg/dL   LDL Calculated 57 0 - 99 mg/dL   Chol/HDL Ratio 3.0 0.0 - 5.0 ratio units  CBC with Differential/Platelet  Result Value Ref Range   WBC 6.5 3.4 - 10.8 x10E3/uL   RBC 4.26 4.14 - 5.80 x10E6/uL   Hemoglobin 13.6 12.6 - 17.7 g/dL   Hematocrit 40.9 37.5 - 51.0 %   MCV 96 79 - 97 fL   MCH 31.9 26.6 - 33.0 pg   MCHC 33.3 31.5 - 35.7 g/dL   RDW 13.6 12.3 - 15.4 %   Platelets 191 150 - 379 x10E3/uL   Neutrophils 62 %   Lymphs 27 %   Monocytes 8 %   Eos 2 %   Basos 1 %   Neutrophils Absolute 4.0 1.4 - 7.0 x10E3/uL   Lymphocytes Absolute 1.7 0.7 - 3.1 x10E3/uL   Monocytes Absolute 0.5 0.1 - 0.9 x10E3/uL   EOS  (ABSOLUTE) 0.1 0.0 - 0.4 x10E3/uL   Basophils Absolute 0.0 0.0 - 0.2 x10E3/uL   Immature Granulocytes 0 %   Immature Grans (Abs) 0.0 0.0 - 0.1 x10E3/uL  TSH  Result Value Ref Range   TSH 0.659 0.450 - 4.500 uIU/mL  Urinalysis, Routine w reflex microscopic (not at [facility])  Result Value Ref Range   Specific Gravity, UA 1.020 1.005 - 1.030   pH, UA 5.0 5.0 - 7.5   Color, UA Yellow Yellow   Appearance Ur Clear Clear   Leukocytes, UA Negative Negative   Protein, UA Negative Negative/Trace   Glucose, UA Negative Negative   Ketones, UA Negative Negative   RBC, UA Negative Negative   Bilirubin, UA Negative Negative   Urobilinogen, Ur 0.2 0.2 - 1.0 mg/dL   Nitrite, UA Negative Negative      Assessment & Plan:   Problem List Items Addressed This Visit      Cardiovascular and Mediastinum   CAD (coronary artery disease) - Primary    Some bradycardia may be exacerbated by metoprolol will cut in half         Respiratory   COPD (chronic obstructive pulmonary disease) ([facility])    Improving wheezing will continue inhalers may be exacerbated by metoprolol will decrease dose to half a tablet        Nervous and Auditory   Familial tremor    Will observe tremor as metoprolol will help that we will see if reducing dose of exacerbates tremor          Follow up plan: Return for As scheduled, Physical Exam.

## 2016-03-25 NOTE — Assessment & Plan Note (Addendum)
Some bradycardia may be exacerbated by metoprolol will cut in half

## 2016-05-02 ENCOUNTER — Inpatient Hospital Stay: Payer: Medicare Other | Admitting: Anesthesiology

## 2016-05-02 ENCOUNTER — Encounter: Payer: Self-pay | Admitting: Emergency Medicine

## 2016-05-02 ENCOUNTER — Inpatient Hospital Stay
Admission: EM | Admit: 2016-05-02 | Discharge: 2016-05-13 | DRG: 470 | Disposition: A | Discharge status: Skilled Nursing Facility | Payer: Medicare Other | Attending: Specialist | Admitting: Specialist

## 2016-05-02 ENCOUNTER — Emergency Department: Payer: Medicare Other

## 2016-05-02 ENCOUNTER — Inpatient Hospital Stay: Payer: Medicare Other

## 2016-05-02 ENCOUNTER — Encounter
Admission: EM | Disposition: A | Discharge status: Skilled Nursing Facility | Payer: Self-pay | Source: Home / Self Care | Attending: Internal Medicine

## 2016-05-02 DIAGNOSIS — E871 Hypo-osmolality and hyponatremia: Secondary | ICD-10-CM | POA: Diagnosis not present

## 2016-05-02 DIAGNOSIS — Z87891 Personal history of nicotine dependence: Secondary | ICD-10-CM

## 2016-05-02 DIAGNOSIS — E039 Hypothyroidism, unspecified: Secondary | ICD-10-CM | POA: Diagnosis present

## 2016-05-02 DIAGNOSIS — K9189 Other postprocedural complications and disorders of digestive system: Secondary | ICD-10-CM | POA: Diagnosis present

## 2016-05-02 DIAGNOSIS — I951 Orthostatic hypotension: Secondary | ICD-10-CM | POA: Diagnosis not present

## 2016-05-02 DIAGNOSIS — S72002A Fracture of unspecified part of neck of left femur, initial encounter for closed fracture: Principal | ICD-10-CM | POA: Diagnosis present

## 2016-05-02 DIAGNOSIS — Z0189 Encounter for other specified special examinations: Secondary | ICD-10-CM

## 2016-05-02 DIAGNOSIS — J42 Unspecified chronic bronchitis: Secondary | ICD-10-CM | POA: Diagnosis not present

## 2016-05-02 DIAGNOSIS — E785 Hyperlipidemia, unspecified: Secondary | ICD-10-CM | POA: Diagnosis present

## 2016-05-02 DIAGNOSIS — E876 Hypokalemia: Secondary | ICD-10-CM | POA: Diagnosis not present

## 2016-05-02 DIAGNOSIS — M25531 Pain in right wrist: Secondary | ICD-10-CM | POA: Diagnosis present

## 2016-05-02 DIAGNOSIS — K219 Gastro-esophageal reflux disease without esophagitis: Secondary | ICD-10-CM | POA: Diagnosis present

## 2016-05-02 DIAGNOSIS — D72829 Elevated white blood cell count, unspecified: Secondary | ICD-10-CM | POA: Diagnosis present

## 2016-05-02 DIAGNOSIS — E875 Hyperkalemia: Secondary | ICD-10-CM | POA: Diagnosis present

## 2016-05-02 DIAGNOSIS — I480 Paroxysmal atrial fibrillation: Secondary | ICD-10-CM | POA: Diagnosis not present

## 2016-05-02 DIAGNOSIS — IMO0001 Reserved for inherently not codable concepts without codable children: Secondary | ICD-10-CM

## 2016-05-02 DIAGNOSIS — R1110 Vomiting, unspecified: Secondary | ICD-10-CM

## 2016-05-02 DIAGNOSIS — K5900 Constipation, unspecified: Secondary | ICD-10-CM | POA: Diagnosis not present

## 2016-05-02 DIAGNOSIS — D62 Acute posthemorrhagic anemia: Secondary | ICD-10-CM | POA: Diagnosis not present

## 2016-05-02 DIAGNOSIS — Z96649 Presence of unspecified artificial hip joint: Secondary | ICD-10-CM

## 2016-05-02 DIAGNOSIS — J449 Chronic obstructive pulmonary disease, unspecified: Secondary | ICD-10-CM | POA: Diagnosis present

## 2016-05-02 DIAGNOSIS — I129 Hypertensive chronic kidney disease with stage 1 through stage 4 chronic kidney disease, or unspecified chronic kidney disease: Secondary | ICD-10-CM | POA: Diagnosis present

## 2016-05-02 DIAGNOSIS — R0601 Orthopnea: Secondary | ICD-10-CM | POA: Diagnosis not present

## 2016-05-02 DIAGNOSIS — M25539 Pain in unspecified wrist: Secondary | ICD-10-CM

## 2016-05-02 DIAGNOSIS — S7292XP Unspecified fracture of left femur, subsequent encounter for closed fracture with malunion: Secondary | ICD-10-CM | POA: Diagnosis not present

## 2016-05-02 DIAGNOSIS — J441 Chronic obstructive pulmonary disease with (acute) exacerbation: Secondary | ICD-10-CM

## 2016-05-02 DIAGNOSIS — W010XXA Fall on same level from slipping, tripping and stumbling without subsequent striking against object, initial encounter: Secondary | ICD-10-CM | POA: Diagnosis present

## 2016-05-02 DIAGNOSIS — I2510 Atherosclerotic heart disease of native coronary artery without angina pectoris: Secondary | ICD-10-CM | POA: Diagnosis present

## 2016-05-02 DIAGNOSIS — I509 Heart failure, unspecified: Secondary | ICD-10-CM | POA: Diagnosis not present

## 2016-05-02 DIAGNOSIS — N183 Chronic kidney disease, stage 3 (moderate): Secondary | ICD-10-CM | POA: Diagnosis present

## 2016-05-02 DIAGNOSIS — I4891 Unspecified atrial fibrillation: Secondary | ICD-10-CM | POA: Diagnosis not present

## 2016-05-02 DIAGNOSIS — Z8711 Personal history of peptic ulcer disease: Secondary | ICD-10-CM | POA: Diagnosis not present

## 2016-05-02 DIAGNOSIS — W19XXXA Unspecified fall, initial encounter: Secondary | ICD-10-CM

## 2016-05-02 DIAGNOSIS — S7290XA Unspecified fracture of unspecified femur, initial encounter for closed fracture: Secondary | ICD-10-CM | POA: Diagnosis present

## 2016-05-02 DIAGNOSIS — R000 Tachycardia, unspecified: Secondary | ICD-10-CM

## 2016-05-02 DIAGNOSIS — S7292XA Unspecified fracture of left femur, initial encounter for closed fracture: Secondary | ICD-10-CM

## 2016-05-02 DIAGNOSIS — Y92513 Shop (commercial) as the place of occurrence of the external cause: Secondary | ICD-10-CM

## 2016-05-02 DIAGNOSIS — Z79899 Other long term (current) drug therapy: Secondary | ICD-10-CM | POA: Diagnosis not present

## 2016-05-02 DIAGNOSIS — K567 Ileus, unspecified: Secondary | ICD-10-CM

## 2016-05-02 DIAGNOSIS — Y838 Other surgical procedures as the cause of abnormal reaction of the patient, or of later complication, without mention of misadventure at the time of the procedure: Secondary | ICD-10-CM | POA: Diagnosis not present

## 2016-05-02 DIAGNOSIS — R0603 Acute respiratory distress: Secondary | ICD-10-CM | POA: Diagnosis not present

## 2016-05-02 DIAGNOSIS — E162 Hypoglycemia, unspecified: Secondary | ICD-10-CM | POA: Diagnosis not present

## 2016-05-02 DIAGNOSIS — G250 Essential tremor: Secondary | ICD-10-CM | POA: Diagnosis present

## 2016-05-02 DIAGNOSIS — Z4659 Encounter for fitting and adjustment of other gastrointestinal appliance and device: Secondary | ICD-10-CM

## 2016-05-02 HISTORY — PX: HIP SURGERY: SHX245

## 2016-05-02 HISTORY — DX: Hypothyroidism, unspecified: E03.9

## 2016-05-02 HISTORY — PX: HIP ARTHROPLASTY: SHX981

## 2016-05-02 HISTORY — DX: Paroxysmal atrial fibrillation: I48.0

## 2016-05-02 HISTORY — DX: Orthostatic hypotension: I95.1

## 2016-05-02 HISTORY — DX: Essential (primary) hypertension: I10

## 2016-05-02 LAB — CBC WITH DIFFERENTIAL/PLATELET
Basophils Absolute: 0 10*3/uL (ref 0–0.1)
Basophils Relative: 0 %
Eosinophils Absolute: 0.1 10*3/uL (ref 0–0.7)
HEMATOCRIT: 41.6 % (ref 40.0–52.0)
Hemoglobin: 14.1 g/dL (ref 13.0–18.0)
LYMPHS ABS: 1.8 10*3/uL (ref 1.0–3.6)
MCH: 32 pg (ref 26.0–34.0)
MCHC: 34 g/dL (ref 32.0–36.0)
MCV: 94.4 fL (ref 80.0–100.0)
MONO ABS: 0.8 10*3/uL (ref 0.2–1.0)
Monocytes Relative: 7 %
Neutro Abs: 8.3 10*3/uL — ABNORMAL HIGH (ref 1.4–6.5)
Neutrophils Relative %: 76 %
PLATELETS: 186 10*3/uL (ref 150–440)
RBC: 4.4 MIL/uL (ref 4.40–5.90)
RDW: 14.1 % (ref 11.5–14.5)
WBC: 11 10*3/uL — ABNORMAL HIGH (ref 3.8–10.6)

## 2016-05-02 LAB — COMPREHENSIVE METABOLIC PANEL
ALBUMIN: 3.7 g/dL (ref 3.5–5.0)
ALT: 11 U/L — ABNORMAL LOW (ref 17–63)
AST: 25 U/L (ref 15–41)
Alkaline Phosphatase: 80 U/L (ref 38–126)
Anion gap: 7 (ref 5–15)
BUN: 15 mg/dL (ref 6–20)
CHLORIDE: 106 mmol/L (ref 101–111)
CO2: 24 mmol/L (ref 22–32)
Calcium: 9 mg/dL (ref 8.9–10.3)
Creatinine, Ser: 0.95 mg/dL (ref 0.61–1.24)
GFR calc Af Amer: >60 mL/min (ref >60)
GFR calc non Af Amer: >60 mL/min (ref >60)
GLUCOSE: 98 mg/dL (ref 65–99)
POTASSIUM: 5.2 mmol/L — AB (ref 3.5–5.1)
SODIUM: 137 mmol/L (ref 135–145)
Total Bilirubin: 1.5 mg/dL — ABNORMAL HIGH (ref 0.3–1.2)
Total Protein: 6.8 g/dL (ref 6.5–8.1)

## 2016-05-02 SURGERY — HEMIARTHROPLASTY, HIP, DIRECT ANTERIOR APPROACH, FOR FRACTURE
Anesthesia: Spinal | Site: Hip | Laterality: Left | Wound class: Clean

## 2016-05-02 MED ORDER — CEFAZOLIN SODIUM-DEXTROSE 2-4 GM/100ML-% IV SOLN: 2.0000 g | INTRAVENOUS | Status: DC

## 2016-05-02 MED ORDER — MIDAZOLAM HCL 5 MG/5ML IJ SOLN
INTRAMUSCULAR | Status: DC | PRN
  Administered 2016-05-02: 1 mg via INTRAVENOUS

## 2016-05-02 MED ORDER — PROPOFOL 500 MG/50ML IV EMUL
INTRAVENOUS | Status: DC | PRN
  Administered 2016-05-02: 40 ug/kg/min via INTRAVENOUS

## 2016-05-02 MED ORDER — LACTATED RINGERS IV SOLN
INTRAVENOUS | Status: DC | PRN
  Administered 2016-05-02 (×2): via INTRAVENOUS

## 2016-05-02 MED ORDER — OXYCODONE-ACETAMINOPHEN 5-325 MG PO TABS: 1.0000 | ORAL_TABLET | Freq: Once | ORAL | Status: DC

## 2016-05-02 MED ORDER — BUPIVACAINE-EPINEPHRINE (PF) 0.25% -1:200000 IJ SOLN
INTRAMUSCULAR | Status: DC | PRN
  Administered 2016-05-02: 30 mL

## 2016-05-02 MED ORDER — BUPIVACAINE LIPOSOME 1.3 % IJ SUSP
INTRAMUSCULAR | Status: AC
  Filled 2016-05-02: qty 20

## 2016-05-02 MED ORDER — FENTANYL CITRATE (PF) 100 MCG/2ML IJ SOLN
INTRAMUSCULAR | Status: DC | PRN
  Administered 2016-05-02: 50 ug via INTRAVENOUS

## 2016-05-02 MED ORDER — MORPHINE SULFATE (PF) 4 MG/ML IV SOLN
4.0000 mg | Freq: Once | INTRAVENOUS | Status: AC
  Administered 2016-05-02: 4 mg via INTRAVENOUS
  Filled 2016-05-02: qty 1

## 2016-05-02 MED ORDER — SODIUM CHLORIDE 0.9 % IJ SOLN
INTRAMUSCULAR | Status: AC
  Filled 2016-05-02: qty 50

## 2016-05-02 MED ORDER — NEOMYCIN-POLYMYXIN B GU 40-200000 IR SOLN
Status: DC | PRN
  Administered 2016-05-02: 16 mL

## 2016-05-02 MED ORDER — PHENYLEPHRINE HCL 10 MG/ML IJ SOLN
INTRAMUSCULAR | Status: AC
  Filled 2016-05-02: qty 1

## 2016-05-02 MED ORDER — PHENYLEPHRINE HCL 10 MG/ML IJ SOLN
INTRAMUSCULAR | Status: DC | PRN
  Administered 2016-05-02 (×4): 200 ug via INTRAVENOUS
  Administered 2016-05-02: 100 ug via INTRAVENOUS

## 2016-05-02 MED ORDER — FENTANYL CITRATE (PF) 100 MCG/2ML IJ SOLN: 25.0000 ug | INTRAMUSCULAR | Status: DC | PRN

## 2016-05-02 MED ORDER — BUPIVACAINE-EPINEPHRINE (PF) 0.25% -1:200000 IJ SOLN
INTRAMUSCULAR | Status: AC
  Filled 2016-05-02: qty 30

## 2016-05-02 MED ORDER — BUPIVACAINE LIPOSOME 1.3 % IJ SUSP
INTRAMUSCULAR | Status: DC | PRN
  Administered 2016-05-02: 20 mL

## 2016-05-02 MED ORDER — ONDANSETRON HCL 4 MG/2ML IJ SOLN: 4.0000 mg | Freq: Once | INTRAMUSCULAR | Status: DC | PRN

## 2016-05-02 MED ORDER — PROPOFOL 10 MG/ML IV BOLUS
INTRAVENOUS | Status: DC | PRN
  Administered 2016-05-02: 30 mg via INTRAVENOUS

## 2016-05-02 MED ORDER — CEFAZOLIN SODIUM 1 G IJ SOLR
INTRAMUSCULAR | Status: DC | PRN
  Administered 2016-05-02: 2 g via INTRAMUSCULAR

## 2016-05-02 MED ORDER — BUPIVACAINE IN DEXTROSE 0.75-8.25 % IT SOLN
INTRATHECAL | Status: DC | PRN
  Administered 2016-05-02: 1.6 mL via INTRATHECAL

## 2016-05-02 MED ORDER — NEOMYCIN-POLYMYXIN B GU 40-200000 IR SOLN
Status: AC
  Filled 2016-05-02: qty 20

## 2016-05-02 MED ORDER — TRANEXAMIC ACID 1000 MG/10ML IV SOLN
INTRAVENOUS | Status: DC | PRN
  Administered 2016-05-02: 1000 mg via INTRAVENOUS

## 2016-05-02 MED ORDER — PHENYLEPHRINE HCL 10 MG/ML IJ SOLN
0.0000 ug/min | INTRAMUSCULAR | Status: DC
  Administered 2016-05-02: 20 ug/min via INTRAVENOUS
  Filled 2016-05-02: qty 1

## 2016-05-02 MED ORDER — SODIUM CHLORIDE 0.9 % IV BOLUS (SEPSIS)
500.0000 mL | Freq: Once | INTRAVENOUS | Status: AC
  Administered 2016-05-02: 500 mL via INTRAVENOUS

## 2016-05-02 MED ORDER — ONDANSETRON HCL 4 MG/2ML IJ SOLN
4.0000 mg | Freq: Once | INTRAMUSCULAR | Status: AC
  Administered 2016-05-02: 4 mg via INTRAVENOUS
  Filled 2016-05-02: qty 2

## 2016-05-02 MED ORDER — PHENYLEPHRINE HCL 10 MG/ML IJ SOLN
10000.0000 ug | INTRAMUSCULAR | Status: DC | PRN
Start: 2016-05-02 — End: 2016-05-02
  Administered 2016-05-02: 60 ug/min via INTRAVENOUS

## 2016-05-02 MED ORDER — TRANEXAMIC ACID 1000 MG/10ML IV SOLN
INTRAVENOUS | Status: AC
  Filled 2016-05-02: qty 10

## 2016-05-02 MED ORDER — ACETAMINOPHEN 10 MG/ML IV SOLN
INTRAVENOUS | Status: AC
  Filled 2016-05-02: qty 100

## 2016-05-02 MED ORDER — SODIUM CHLORIDE 0.9 % IV BOLUS (SEPSIS)
1000.0000 mL | Freq: Once | INTRAVENOUS | Status: AC
  Administered 2016-05-02: 1000 mL via INTRAVENOUS

## 2016-05-02 MED ORDER — ACETAMINOPHEN 10 MG/ML IV SOLN
INTRAVENOUS | Status: DC | PRN
  Administered 2016-05-02: 1000 mg via INTRAVENOUS

## 2016-05-02 SURGICAL SUPPLY — 56 items
BAG DECANTER FOR FLEXI CONT (MISCELLANEOUS) ×3 IMPLANT
BLADE SAGITTAL WIDE XTHICK NO (BLADE) ×3 IMPLANT
BLADE SURG SZ20 CARB STEEL (BLADE) ×3 IMPLANT
BNDG COHESIVE 6X5 TAN STRL LF (GAUZE/BANDAGES/DRESSINGS) ×3 IMPLANT
BOWL CEMENT MIXING ADV NOZZLE (MISCELLANEOUS) IMPLANT
CANISTER SUCT 1200ML W/VALVE (MISCELLANEOUS) ×3 IMPLANT
CANISTER SUCT 3000ML (MISCELLANEOUS) ×6 IMPLANT
CAPT HIP HEMI 2 ×3 IMPLANT
CHLORAPREP W/TINT 26ML (MISCELLANEOUS) ×6 IMPLANT
DECANTER SPIKE VIAL GLASS SM (MISCELLANEOUS) ×6 IMPLANT
DRAPE IMP U-DRAPE 54X76 (DRAPES) ×6 IMPLANT
DRAPE INCISE IOBAN 66X60 STRL (DRAPES) ×6 IMPLANT
DRAPE SHEET LG 3/4 BI-LAMINATE (DRAPES) ×3 IMPLANT
DRAPE SURG 17X23 STRL (DRAPES) ×3 IMPLANT
DRSG OPSITE POSTOP 4X12 (GAUZE/BANDAGES/DRESSINGS) IMPLANT
DRSG OPSITE POSTOP 4X14 (GAUZE/BANDAGES/DRESSINGS) IMPLANT
DRSG OPSITE POSTOP 4X8 (GAUZE/BANDAGES/DRESSINGS) ×3 IMPLANT
ELECT BLADE 6.5 EXT (BLADE) ×3 IMPLANT
ELECT CAUTERY BLADE 6.4 (BLADE) ×3 IMPLANT
ELECT REM PT RETURN 9FT ADLT (ELECTROSURGICAL) ×3
ELECTRODE REM PT RTRN 9FT ADLT (ELECTROSURGICAL) ×1 IMPLANT
GAUZE PACK 2X3YD (MISCELLANEOUS) ×3 IMPLANT
GLOVE BIO SURGEON STRL SZ8 (GLOVE) ×30 IMPLANT
GLOVE INDICATOR 8.0 STRL GRN (GLOVE) ×9 IMPLANT
GOWN STRL REUS W/ TWL LRG LVL3 (GOWN DISPOSABLE) IMPLANT
GOWN STRL REUS W/ TWL XL LVL3 (GOWN DISPOSABLE) ×3 IMPLANT
GOWN STRL REUS W/TWL LRG LVL3 (GOWN DISPOSABLE)
GOWN STRL REUS W/TWL XL LVL3 (GOWN DISPOSABLE) ×6
HANDPIECE SUCTION TUBG SURGILV (MISCELLANEOUS) ×3 IMPLANT
HOOD PEEL AWAY FLYTE STAYCOOL (MISCELLANEOUS) ×9 IMPLANT
IV NS 100ML SINGLE PACK (IV SOLUTION) ×3 IMPLANT
NDL SAFETY 18GX1.5 (NEEDLE) ×3 IMPLANT
NEEDLE FILTER BLUNT 18X 1/2SAF (NEEDLE) ×2
NEEDLE FILTER BLUNT 18X1 1/2 (NEEDLE) ×1 IMPLANT
NEEDLE SPNL 20GX3.5 QUINCKE YW (NEEDLE) ×3 IMPLANT
NS IRRIG 1000ML POUR BTL (IV SOLUTION) ×3 IMPLANT
PACK HIP PROSTHESIS (MISCELLANEOUS) ×3 IMPLANT
PILLOW ABDUC SM (MISCELLANEOUS) ×3 IMPLANT
SOL .9 NS 3000ML IRR  AL (IV SOLUTION) ×4
SOL .9 NS 3000ML IRR UROMATIC (IV SOLUTION) ×2 IMPLANT
STAPLER SKIN PROX 35W (STAPLE) ×3 IMPLANT
STEM COLLARLESS RED 14X150MM (Stem) ×3 IMPLANT
STRAP SAFETY BODY (MISCELLANEOUS) ×3 IMPLANT
SUT ETHIBOND #5 BRAIDED 30INL (SUTURE) ×3 IMPLANT
SUT ETHIBOND 2 V 37 (SUTURE) ×6 IMPLANT
SUT ETHIBOND CT1 BRD #0 30IN (SUTURE) ×3 IMPLANT
SUT QUILL PDO 2 24X24 VLT (SUTURE) ×3 IMPLANT
SUT VIC AB 0 CT1 27 (SUTURE) ×4
SUT VIC AB 0 CT1 27XCR 8 STRN (SUTURE) ×2 IMPLANT
SUT VIC AB 1 CT1 36 (SUTURE) ×3 IMPLANT
SUT VIC AB 2-0 CT1 27 (SUTURE) ×4
SUT VIC AB 2-0 CT1 TAPERPNT 27 (SUTURE) ×2 IMPLANT
SYR 30ML LL (SYRINGE) ×3 IMPLANT
SYR TB 1ML 27GX1/2 LL (SYRINGE) ×3 IMPLANT
SYRINGE 10CC LL (SYRINGE) ×3 IMPLANT
TAPE TRANSPORE STRL 2 31045 (GAUZE/BANDAGES/DRESSINGS) ×3 IMPLANT

## 2016-05-02 NOTE — Anesthesia Preprocedure Evaluation (Addendum)
Anesthesia Evaluation  Patient identified by MRN, date of birth, ID band Patient awake    Reviewed: Allergy & Precautions, NPO status , Patient's Chart, lab work & pertinent test results, reviewed documented beta blocker date and time   Airway Mallampati: II  TM Distance: >3 FB     Dental  (+) Chipped   Pulmonary COPD, former smoker,           Cardiovascular hypertension, Pt. on medications and Pt. on home beta blockers + CAD       Neuro/Psych    GI/Hepatic PUD,   Endo/Other  Hypothyroidism   Renal/GU Renal InsufficiencyRenal disease     Musculoskeletal   Abdominal   Peds  Hematology   Anesthesia Other Findings Familial tremor. Milk at 11-12pm. CXR COPD. EKG Normal. K 5.2.  Reproductive/Obstetrics                            Anesthesia Physical Anesthesia Plan  ASA: III  Anesthesia Plan: Spinal   Post-op Pain Management:    Induction:   Airway Management Planned: Oral ETT  Additional Equipment:   Intra-op Plan:   Post-operative Plan:   Informed Consent: I have reviewed the patients History and Physical, chart, labs and discussed the procedure including the risks, benefits and alternatives for the proposed anesthesia with the patient or authorized representative who has indicated his/her understanding and acceptance.     Plan Discussed with: CRNA  Anesthesia Plan Comments:         Anesthesia Quick Evaluation

## 2016-05-02 NOTE — Transfer of Care (Deleted)
Immediate Anesthesia Transfer of Care Note  Patient: Jeffrey Haynes  Procedure(s) Performed: Procedure(s): ARTHROPLASTY BIPOLAR HIP (HEMIARTHROPLASTY) (Left)  Patient Location: PACU  Anesthesia Type:General  Level of Consciousness: sedated  Airway & Oxygen Therapy: Patient Spontanous Breathing and Patient connected to face mask oxygen  Post-op Assessment: Report given to RN and Post -op Vital signs reviewed and stable  Post vital signs: Reviewed and stable  Last Vitals:  Filed Vitals:   05/02/16 1431 05/02/16 2117  BP: 176/99 119/57  Pulse: 66 51  Temp: 36.9 C 36.9 C  Resp: 18 14    Complications: No apparent anesthesia complications

## 2016-05-02 NOTE — Consult Note (Signed)
ORTHOPAEDIC CONSULTATION  REQUESTING PHYSICIAN: Fritzi Mandes, MD  Chief Complaint:   Left hip pain status post fall.  History of Present Illness: Jeffrey Haynes is a 82 y.o. male with a history of coronary artery disease, COPD, hyperlipidemia, and bleeding ulcer who was in his usual state of health today. Apparently, while at a local business establishment, he slipped on a tile floor and landed on his left hip. He was unable to ambulate and so was brought to the emergency room where x-rays demonstrated a displaced left femoral neck fracture. The patient denies any associated injuries as a result of the fall. He did not strike his head or lose consciousness. The patient also denies any lightheadedness, dizziness, chest pain, shortness of breath, or other symptoms that may have precipitated his fall. He denies any numbness or paresthesias down his leg.  Past Medical History  Diagnosis Date  . COPD (chronic obstructive pulmonary disease) ([place])   . CAD (coronary artery disease)   . Hyperlipidemia   . Familial tremor   . Bleeding ulcer    Past Surgical History  Procedure Laterality Date  . Hernia repair    . Spine surgery  1975    LS disk   Social History   Social History  . Marital Status: Married    Spouse Name: N/A  . Number of Children: N/A  . Years of Education: N/A   Social History Main Topics  . Smoking status: Former Smoker    Types: Cigarettes    Quit date: 05/02/1978  . Smokeless tobacco: Former Systems developer    Types: [place] date: 10/02/2012  . Alcohol Use: No  . Drug Use: No  . Sexual Activity: Not Asked   Other Topics Concern  . None   Social History Narrative   Family History  Problem Relation Age of Onset  . Family history unknown: Yes   No Known Allergies Prior to Admission medications   Medication Sig Start Date End Date Taking? Authorizing Provider  albuterol (PROVENTIL HFA;VENTOLIN HFA) 108  (90 BASE) MCG/ACT inhaler Inhale 2 puffs into the lungs every 6 (six) hours as needed for wheezing or shortness of breath. 11/06/15   [name], MD  atorvastatin (LIPITOR) 20 MG tablet Take 1 tablet (20 mg total) by mouth daily. 11/06/15   [name], MD  Ipratropium-Albuterol (COMBIVENT) 20-100 MCG/ACT AERS respimat Inhale 2 puffs into the lungs 2 (two) times daily. 11/06/15   [name], MD  levothyroxine (SYNTHROID, LEVOTHROID) 75 MCG tablet Take 1 tablet (75 mcg total) by mouth daily before breakfast. 11/06/15   [name], MD  metoprolol succinate (TOPROL-XL) 25 MG 24 hr tablet Take 1 tablet (25 mg total) by mouth daily. 11/06/15   [name], MD   Dg Chest 1 View  05/02/2016  CLINICAL DATA:  Fall today. EXAM: CHEST 1 VIEW COMPARISON:  10/03/2012 FINDINGS: There are fibrotic changes within the lungs. Mild hyperinflation. Heart is normal size. No acute airspace opacities or effusions. No acute bony abnormality. IMPRESSION: COPD, fibrotic changes.  No active disease. Electronically Signed   By: Rolm Baptise M.D.   On: 05/02/2016 15:45   Dg Pelvis 1-2 Views  05/02/2016  CLINICAL DATA:  Walking on a corner and slipped, status post fall EXAM: PELVIS - 1-2 VIEW COMPARISON:  None. FINDINGS: Generalized osteopenia. Mildly displaced left femoral neck fracture. No hip dislocation. No other fracture or dislocation. IMPRESSION: Mildly displaced left femoral neck fracture. Electronically Signed  By: Kathreen Devoid   On: 05/02/2016 15:49   Dg Femur Min 2 Views Left  05/02/2016  CLINICAL DATA:  Left femur pain after falling today. Initial encounter. EXAM: LEFT FEMUR 2 VIEWS COMPARISON:  None. FINDINGS: Moderately displaced proximal left femoral neck fracture is noted. The remaining portion of the femur appears normal. IMPRESSION: Moderate displaced proximal left femoral neck fracture. Electronically Signed   By: Marijo Conception, M.D.   On: 05/02/2016 15:42    Positive ROS: All other  systems have been reviewed and were otherwise negative with the exception of those mentioned in the HPI and as above.  Physical Exam: General:  Alert, no acute distress Psychiatric:  Patient is competent for consent with normal mood and affect   Cardiovascular:  No pedal edema Respiratory:  No wheezing, non-labored breathing GI:  Abdomen is soft and non-tender Skin:  No lesions in the area of chief complaint Neurologic:  Sensation intact distally Lymphatic:  No axillary or cervical lymphadenopathy  Orthopedic Exam:  Orthopedic examination is limited to the left hip and lower extremity. The left lower extremity is in a somewhat shortened and externally rotated position as compared to the right leg. Skin inspection around the left hip is unremarkable. He has moderate tenderness to palpation over the lateral aspect of the left hip. He has more severe pain with any attempted active or passive motion of the left hip. He is neurovascularly intact to the left lower extremity and foot, demonstrating the ability to actively dorsiflex and plantarflex his toes and ankle. Sensation is intact to light touch to all distributions of his left lower extremity. He has good capillary refill to his left foot.  X-rays:  X-rays of the pelvis and left hip are available for review. The findings are as described above. No significant degenerative changes are noted. No other abnormalities are identified.  Assessment: Displaced left femoral neck fracture.  Plan: The treatment options are discussed with the patient and his son, who is at the patient's bedside, including both nonsurgical and surgical options. The patient and his son would like to proceed with a left hip hemiarthroplasty. This procedure has been discussed in detail with the patient and his son, as have the potential risks (including bleeding, infection, nerve and/or blood vessel injury, persistent or recurrent pain, stiffness, dislocation, leg length  inequality, loosening of and/or failure of the components, need for further surgery, blood clots, strokes, heart attacks and/or arrhythmias, etc.) and benefits. The patient states understanding and wishes to proceed. A consent has been signed.  Thank you for asking me to participate in the care of this delightful man. I will be happy to follow him with you.   Pascal Lux, MD  Beeper #:  [phone]  05/02/2016 6:43 PM

## 2016-05-02 NOTE — ED Notes (Signed)
Pt comes into the ED via EMS c/o a fall that occurred at a retail sore earlier today.  Patient slipped and is c/o left hamstring pain.  Denies any hip or knee pain at this time.  Patient in NAD with even and unlabored respirations.

## 2016-05-02 NOTE — Transfer of Care (Addendum)
Immediate Anesthesia Transfer of Care Note  Patient: Jeffrey Haynes  Procedure(s) Performed: Procedure(s): ARTHROPLASTY BIPOLAR HIP (HEMIARTHROPLASTY) (Left)  Patient Location: PACU  Anesthesia Type:Spinal  Level of Consciousness: awake, alert  and oriented  Airway & Oxygen Therapy: Patient Spontanous Breathing and Patient connected to face mask oxygen  Post-op Assessment: Report given to RN and Post -op Vital signs reviewed and stable  Post vital signs: Reviewed and stable  Last Vitals:  Filed Vitals:   05/02/16 1431 05/02/16 2117  BP: 176/99 119/57  Pulse: 66 51  Temp: 36.9 C 36.9 C  Resp: 18 14    Complications: No apparent anesthesia complications

## 2016-05-02 NOTE — Op Note (Signed)
05/02/2016  9:11 PM  Patient:   Jeffrey Haynes  Pre-Op Diagnosis:   Displaced femoral neck fracture, left hip.  Post-Op Diagnosis:   Same.  Procedure:   Left hip unipolar hemiarthroplasty.  Surgeon:   Pascal Lux, MD  Assistant:   Vance Peper, PA-C  Anesthesia:   Spinal  Findings:   As above.  Complications:   None  EBL:   150 cc  Fluids:   1000 cc crystalloid  UOP:   500 cc  TT:   None  Drains:   None  Closure:   Staples  Implants:   Biomet press-fit system with a #14 standard offset Echo femoral stem, a 54 mm outer diameter shell, a 28 mm head, and a +3 mm neck  Brief Clinical Note:   The patient is an 82 year old male who sustained the above-noted injury this afternoon when he slipped on some floor tiling and landed on his left hip. He was brought to the emergency room where x-rays demonstrated the above-noted fracture. The patient has been cleared medically and presents at this time for definitive management of the injury.  Procedure:   The patient was brought into the operating room. After adequate spinal anesthesia was obtained, the patient was repositioned in the right lateral decubitus position and secured using a lateral hip positioner. The left hip and lower extremity were prepped with ChloroPrep solution before being draped sterilely. Preoperative antibiotics were administered. A timeout was performed to verify the appropriate surgical site before a standard posterior approach to the hip was made through an approximately 4-5 inch incision. The incision was carried down through the subcutaneous tissues to expose the gluteal fascia and proximal end of the iliotibial band. These structures were split the length of the incision and the Charnley self-retaining hip retractor placed. The bursal tissues were swept posteriorly to expose the short external rotators. The anterior border of the piriformis tendon was identified and this plane developed down through the capsule to  enter the joint. Abundant fracture hematoma was suctioned. A flap of tissue was elevated off the posterior aspect of the femoral neck and greater trochanter and retracted posteriorly. This flap included the piriformis tendon, the short external rotators, and the posterior capsule. The femoral head was removed in its entirety, then taken to the back table where it was measured and found to be optimally replicated by a 54 mm head. The appropriate trial head was inserted and found to demonstrate an excellent suction fit.   Attention was directed to the femoral side. The femoral neck was recut 10-12 mm above the lesser trochanter using an oscillating saw. The piriformis fossa was debrided of soft tissues before the intramedullary canal was accessed through this point using a triple step reamer. The canal was reamed sequentially beginning with a #7 tapered reamer and progressing to a #14 tapered reamer. This provided excellent circumferential chatter. A box osteotome was used to establish version before the canal was broached sequentially beginning with a #11 broach and progressing to a #14 broach. This was left in place and several trial reductions performed using both a standard and laterally offset neck options. The permanent #14 standard offset femoral stem was impacted into place. A repeat trial reduction was performed using both the +0 mm and +3 mm neck lengths. The +3 mm neck length demonstrated excellent stability both in extension and external rotation as well as with flexion to 90 and internal rotation beyond 70. It also was stable in the position of  sleep. The 54 mm outer diameter shell with the +3 mm neck adapter construct was put together on the back table before being impacted onto the stem of the femoral component. The Morse taper locking mechanism was verified using manual distraction before the head was relocated and placed through a range of motion with the findings as described above.  The wound  was copiously irrigated with bacitracin saline solution via the jet lavage system before the peri-incisional and pericapsular tissues were injected with 30 cc of 0.5% Sensorcaine with epinephrine and 20 cc of Exparel diluted out to 60 cc with normal saline to help with postoperative analgesia. The posterior flap was reapproximated to the posterior aspect of the greater trochanter using #5 Tycron interrupted sutures placed through drill holes. The iliotibial band was reapproximated using #0 Vicryl interrupted sutures before the gluteal fascia was closed using a running #0 Vicryl suture. At this point, 1 g of transexemic acid in 10 cc of normal saline was injected into the joint to help reduce postoperative bleeding. The subcutaneous tissues were closed in several layers using 2-0 Vicryl interrupted sutures before the skin was closed using staples. A sterile occlusive dressing was applied to the wound before the patient was placed into an abduction wedge pillow. The patient was then rolled back into the supine position on the hospital bed before being awakened and returned to the recovery room in satisfactory condition after tolerating the procedure well.

## 2016-05-02 NOTE — ED Provider Notes (Signed)
[facility] Emergency Department Provider Note  ____________________________________________  Time seen: Approximately 2:41 PM  I have reviewed the triage vital signs and the nursing notes.   HISTORY  Chief Complaint Fall    HPI Jeffrey Haynes is a 82 y.o. male who presents emergency department via EMS status post a fall earlier today. Patient states that he was out shopping when he slipped on a tile floor, falling, landing on left hip/leg. Patient endorses a mechanical fall with no dizziness, chest pain, shortness of breath prior to incident. Patient is complaining of posterior mid femur pain. He denies any hip or knee pain. Patient did not hit his head or lose consciousness. The patient remembers the entire event. Patient reports that the pain is severe at this time. It is worse with movement. He has not had any medications from EMS prior to arrival. Patient has no other complaint at this time.  Patient does have a history of COPD, CAD, CKD. Patient denies any complaints surrounding these diagnoses.   Past Medical History  Diagnosis Date  . COPD (chronic obstructive pulmonary disease) ([place])   . CAD (coronary artery disease)   . Hyperlipidemia   . Familial tremor   . Bleeding ulcer     Patient Active Problem List   Diagnosis Date Noted  . Femur fracture ([place]) 05/02/2016  . COPD (chronic obstructive pulmonary disease) ([place]) 11/06/2015  . CAD (coronary artery disease) 11/06/2015  . Hypothyroidism 11/06/2015  . Hyperlipidemia 11/06/2015  . Familial tremor 11/06/2015  . CKD (chronic kidney disease) stage 3, GFR 30-59 ml/min 11/06/2015    Past Surgical History  Procedure Laterality Date  . Hernia repair    . Spine surgery  1975    LS disk    Current Outpatient Rx  Name  Route  Sig  Dispense  Refill  . albuterol (PROVENTIL HFA;VENTOLIN HFA) 108 (90 BASE) MCG/ACT inhaler   Inhalation   Inhale 2 puffs into the lungs every 6 (six) hours as needed for  wheezing or shortness of breath.   1 Inhaler   2   . atorvastatin (LIPITOR) 20 MG tablet   Oral   Take 1 tablet (20 mg total) by mouth daily.   90 tablet   4   . Ipratropium-Albuterol (COMBIVENT) 20-100 MCG/ACT AERS respimat   Inhalation   Inhale 2 puffs into the lungs 2 (two) times daily.   3 Inhaler   4   . levothyroxine (SYNTHROID, LEVOTHROID) 75 MCG tablet   Oral   Take 1 tablet (75 mcg total) by mouth daily before breakfast.   90 tablet   4   . metoprolol succinate (TOPROL-XL) 25 MG 24 hr tablet   Oral   Take 1 tablet (25 mg total) by mouth daily.   90 tablet   4     Allergies Review of patient's allergies indicates no known allergies.  Family History  Problem Relation Age of Onset  . Family history unknown: Yes    Social History Social History  Substance Use Topics  . Smoking status: Former Smoker    Types: Cigarettes    Quit date: 05/02/1978  . Smokeless tobacco: Former Systems developer    Types: [place] date: 10/02/2012  . Alcohol Use: No     Review of Systems  Constitutional: No fever/chills Eyes: No visual changes.  Cardiovascular: no chest pain. Respiratory: no cough. No SOB. Gastrointestinal: No abdominal pain.  No nausea, no vomiting.   Musculoskeletal:Positive for left leg pain.  Skin: Negative for rash, abrasions, lacerations, ecchymosis. Neurological: Negative for headaches, focal weakness or numbness. 10-point ROS otherwise negative.  ____________________________________________   PHYSICAL EXAM:  VITAL SIGNS: ED Triage Vitals  Enc Vitals Group     BP 05/02/16 1431 176/99 mmHg     Pulse Rate 05/02/16 1431 66     Resp 05/02/16 1431 18     Temp 05/02/16 1431 98.5 F (36.9 C)     Temp Source 05/02/16 1431 Oral     SpO2 05/02/16 1431 98 %     Weight 05/02/16 1431 185 lb (83.915 kg)     Height 05/02/16 1431 5' 9.5" (1.765 m)     Head Cir --      Peak Flow --      Pain Score 05/02/16 1431 10     Pain Loc --      Pain Edu? --       Excl. in [place]? --      Constitutional: Alert and oriented. Well appearing and in no acute distress. Eyes: Conjunctivae are normal. PERRL. EOMI. Head: Atraumatic.No visible signs,. Patient is nontender to palpation of the bony landmarks of the skull. No crepitus is noted. No battle signs. No raccoon eyes. No epistaxis.Her symptoms fluid drainage from ears or nares. Neck: No stridor.  No cervical spine tenderness to palpation. Neck is supple with full range of motion.  Cardiovascular: Normal rate, regular rhythm. Normal S1 and S2.  Good peripheral circulation. Respiratory: Normal respiratory effort without tachypnea or retractions. Lungs CTAB. Good air entry to the bases with no decreased or absent breath sounds. Musculoskeletal: Full range of motion to all extremities. No gross deformities appreciated. No deformities or edema is noted to the left femur upon inspection. No ecchymosis, abrasions, lacerations as noted. There is no tenderness to palpation or crepitus noted with inspection of pelvic girdle. Patient is nontender to palpation over the greater trochanter. Patient is tender to palpation over the anterior and posterior aspect mid femur. No palpable abnormality. Full range of motion to the left knee. No tenderness to palpation over knee. Dorsalis pedis pulse is intact distally. Sensation intact to ankle bilateral lower extremities. Neurologic:  Normal speech and language. No gross focal neurologic deficits are appreciated. Cranial nerves II through XII are grossly intact. Skin:  Skin is warm, dry and intact. No rash noted. Psychiatric: Mood and affect are normal. Speech and behavior are normal. Patient exhibits appropriate insight and judgement.   ____________________________________________   LABS (all labs ordered are listed, but only abnormal results are displayed)  Labs Reviewed  COMPREHENSIVE METABOLIC PANEL - Abnormal; Notable for the following:    Potassium 5.2 (*)    ALT 11 (*)     Total Bilirubin 1.5 (*)    All other components within normal limits  CBC WITH DIFFERENTIAL/PLATELET - Abnormal; Notable for the following:    WBC 11.0 (*)    Neutro Abs 8.3 (*)    All other components within normal limits   ____________________________________________  EKG  EKG reveals normal sinus rhythm at a rate of 75 bpm. No ST elevation or depression is noted. T wave inversion noted in V1. No ST elevation or depression noted. PR, QRS, QT intervals within normal limits. No Q waves or delta waves identified. ____________________________________________  RADIOLOGY Diamantina [facility] [place], personally viewed and evaluated these images (plain radiographs) as part of my medical decision making, as well as reviewing the written report by the radiologist.  Dg Chest 1 View  05/02/2016  CLINICAL DATA:  Fall today. EXAM: CHEST 1 VIEW COMPARISON:  10/03/2012 FINDINGS: There are fibrotic changes within the lungs. Mild hyperinflation. Heart is normal size. No acute airspace opacities or effusions. No acute bony abnormality. IMPRESSION: COPD, fibrotic changes.  No active disease. Electronically Signed   By: Rolm Baptise M.D.   On: 05/02/2016 15:45   Dg Pelvis 1-2 Views  05/02/2016  CLINICAL DATA:  Walking on a corner and slipped, status post fall EXAM: PELVIS - 1-2 VIEW COMPARISON:  None. FINDINGS: Generalized osteopenia. Mildly displaced left femoral neck fracture. No hip dislocation. No other fracture or dislocation. IMPRESSION: Mildly displaced left femoral neck fracture. Electronically Signed   By: Kathreen Devoid   On: 05/02/2016 15:49   Dg Femur Min 2 Views Left  05/02/2016  CLINICAL DATA:  Left femur pain after falling today. Initial encounter. EXAM: LEFT FEMUR 2 VIEWS COMPARISON:  None. FINDINGS: Moderately displaced proximal left femoral neck fracture is noted. The remaining portion of the femur appears normal. IMPRESSION: Moderate displaced proximal left femoral neck fracture. Electronically  Signed   By: Marijo Conception, M.D.   On: 05/02/2016 15:42    ____________________________________________    PROCEDURES  Procedure(s) performed:       Medications  ceFAZolin (ANCEF) IVPB 2g/100 mL premix (not administered)  sodium chloride 0.9 % bolus 1,000 mL (1,000 mLs Intravenous New Bag/Given 05/02/16 1551)  ondansetron (ZOFRAN) injection 4 mg (4 mg Intravenous Given 05/02/16 1548)  morphine 4 MG/ML injection 4 mg (4 mg Intravenous Given 05/02/16 1549)  morphine 4 MG/ML injection 4 mg (4 mg Intravenous Given 05/02/16 1802)     ____________________________________________   INITIAL IMPRESSION / ASSESSMENT AND PLAN / ED COURSE  Pertinent labs & imaging results that were available during my care of the patient were reviewed by me and considered in my medical decision making (see chart for details).  Patient's diagnosis is consistent with Fall resulting in a left femoral neck fracture. Patient had a mechanical fall and did not hit head or lose consciousness at any point. Patient was endorsing severe pain to area. X-rays reveal the above diagnosis. Patient informed of diagnosis. Diplomatic Services operational officer. Dr. Roland Rack, is consulted and he advised that patient will have surgery for fixation. Hospitalist is advised of patient's diagnosis and impending admission and evaluates patient.. Patient will leave the ER directly to the OR suite. Patient care will be transferred to the hospitalist and orthopedic surgeon.     ____________________________________________  FINAL CLINICAL IMPRESSION(S) / ED DIAGNOSES  Final diagnoses:  Fall, initial encounter  Femur fracture, left, closed, initial encounter      NEW MEDICATIONS STARTED DURING THIS VISIT:  New Prescriptions   No medications on file        This chart was dictated using voice recognition software/Dragon. Despite best efforts to proofread, errors can occur which can change the meaning. Any change was purely  unintentional.    Darletta Moll, PA-C 05/02/16 1812  Nance Pear, MD 05/12/16 [phone]

## 2016-05-02 NOTE — H&P (Signed)
[facility] [place] NAME: Jeffrey Haynes    MR#:  [id number]  DATE OF BIRTH:  07-20-1925  DATE OF ADMISSION:  05/02/2016  PRIMARY CARE PHYSICIAN: Golden Pop, MD   REQUESTING/REFERRING PHYSICIAN: Roderic Palau PA  CHIEF COMPLAINT:   Left hip pain after a fall today. HISTORY OF PRESENT ILLNESS:  Jeffrey Haynes  is a 82 y.o. male with a known history of CAD, COPD, hyperlipidemia comes in the emergency room after he had a fall, mechanical and home while trying to walk on the carpet and slipped on the tile floor. Patient started having significant left hip pain came to the emergency room was found to have left femur fracture. Patient is otherwise very functional denies any chest pain shortness of breath is able to carry out all his daily activities without any difficulty. He is being admitted for further evaluation management.  PAST MEDICAL HISTORY:   Past Medical History  Diagnosis Date  . COPD (chronic obstructive pulmonary disease) ([place])   . CAD (coronary artery disease)   . Hyperlipidemia   . Familial tremor   . Bleeding ulcer     PAST SURGICAL HISTOIRY:   Past Surgical History  Procedure Laterality Date  . Hernia repair    . Spine surgery  1975    LS disk    SOCIAL HISTORY:   Social History  Substance Use Topics  . Smoking status: Former Smoker    Types: Cigarettes    Quit date: 05/02/1978  . Smokeless tobacco: Former Systems developer    Types: [place] date: 10/02/2012  . Alcohol Use: No    FAMILY HISTORY:   Family History  Problem Relation Age of Onset  . Family history unknown: Yes    DRUG ALLERGIES:  No Known Allergies  REVIEW OF SYSTEMS:  Review of Systems  Constitutional: Negative for fever, chills and weight loss.  HENT: Negative for ear discharge, ear pain and nosebleeds.   Eyes: Negative for blurred vision, pain and discharge.  Respiratory: Negative for sputum production, shortness of breath, wheezing and  stridor.   Cardiovascular: Negative for chest pain, palpitations, orthopnea and PND.  Gastrointestinal: Negative for nausea, vomiting, abdominal pain and diarrhea.  Genitourinary: Negative for urgency and frequency.  Musculoskeletal: Positive for joint pain and falls. Negative for back pain.  Neurological: Positive for weakness. Negative for sensory change, speech change and focal weakness.  Psychiatric/Behavioral: Negative for depression and hallucinations. The patient is not nervous/anxious.   All other systems reviewed and are negative.    MEDICATIONS AT HOME:   Prior to Admission medications   Medication Sig Start Date End Date Taking? Authorizing Provider  albuterol (PROVENTIL HFA;VENTOLIN HFA) 108 (90 BASE) MCG/ACT inhaler Inhale 2 puffs into the lungs every 6 (six) hours as needed for wheezing or shortness of breath. 11/06/15   [name], MD  atorvastatin (LIPITOR) 20 MG tablet Take 1 tablet (20 mg total) by mouth daily. 11/06/15   [name], MD  Ipratropium-Albuterol (COMBIVENT) 20-100 MCG/ACT AERS respimat Inhale 2 puffs into the lungs 2 (two) times daily. 11/06/15   [name], MD  levothyroxine (SYNTHROID, LEVOTHROID) 75 MCG tablet Take 1 tablet (75 mcg total) by mouth daily before breakfast. 11/06/15   [name], MD  metoprolol succinate (TOPROL-XL) 25 MG 24 hr tablet Take 1 tablet (25 mg total) by mouth daily. 11/06/15   [name], MD      VITAL SIGNS:  Blood  pressure 176/99, pulse 66, temperature 98.5 F (36.9 C), temperature source Oral, resp. rate 18, height 5' 9.5" (1.765 m), weight 83.915 kg (185 lb), SpO2 98 %.  PHYSICAL EXAMINATION:  GENERAL:  82 y.o.-year-old patient lying in the bed with no acute distress.  EYES: Pupils equal, round, reactive to light and accommodation. No scleral icterus. Extraocular muscles intact.  HEENT: Head atraumatic, normocephalic. Oropharynx and nasopharynx clear.  NECK:  Supple, no jugular venous distention.  No thyroid enlargement, no tenderness.  LUNGS: Normal breath sounds bilaterally, no wheezing, rales,rhonchi or crepitation. No use of accessory muscles of respiration.  CARDIOVASCULAR: S1, S2 normal. No murmurs, rubs, or gallops.  ABDOMEN: Soft, nontender, nondistended. Bowel sounds present. No organomegaly or mass.  EXTREMITIES: No pedal edema, cyanosis, or clubbing. Left leg externally rotated NEUROLOGIC: Cranial nerves II through XII are intact. Muscle strength 5/5 in all extremities. Sensation intact. Gait not checked.  PSYCHIATRIC: The patient is alert and oriented x 3.  SKIN: No obvious rash, lesion, or ulcer.   LABORATORY PANEL:   CBC  Recent Labs Lab 05/02/16 1543  WBC 11.0*  HGB 14.1  HCT 41.6  PLT 186   ------------------------------------------------------------------------------------------------------------------  Chemistries   Recent Labs Lab 05/02/16 1543  NA 137  K 5.2*  CL 106  CO2 24  GLUCOSE 98  BUN 15  CREATININE 0.95  CALCIUM 9.0  AST 25  ALT 11*  ALKPHOS 80  BILITOT 1.5*   ------------------------------------------------------------------------------------------------------------------  Cardiac Enzymes No results for input(s): TROPONINI in the last 168 hours. ------------------------------------------------------------------------------------------------------------------  RADIOLOGY:  Dg Chest 1 View  05/02/2016  CLINICAL DATA:  Fall today. EXAM: CHEST 1 VIEW COMPARISON:  10/03/2012 FINDINGS: There are fibrotic changes within the lungs. Mild hyperinflation. Heart is normal size. No acute airspace opacities or effusions. No acute bony abnormality. IMPRESSION: COPD, fibrotic changes.  No active disease. Electronically Signed   By: Rolm Baptise M.D.   On: 05/02/2016 15:45   Dg Pelvis 1-2 Views  05/02/2016  CLINICAL DATA:  Walking on a corner and slipped, status post fall EXAM: PELVIS - 1-2 VIEW COMPARISON:  None. FINDINGS: Generalized osteopenia.  Mildly displaced left femoral neck fracture. No hip dislocation. No other fracture or dislocation. IMPRESSION: Mildly displaced left femoral neck fracture. Electronically Signed   By: Kathreen Devoid   On: 05/02/2016 15:49   Dg Femur Min 2 Views Left  05/02/2016  CLINICAL DATA:  Left femur pain after falling today. Initial encounter. EXAM: LEFT FEMUR 2 VIEWS COMPARISON:  None. FINDINGS: Moderately displaced proximal left femoral neck fracture is noted. The remaining portion of the femur appears normal. IMPRESSION: Moderate displaced proximal left femoral neck fracture. Electronically Signed   By: Marijo Conception, M.D.   On: 05/02/2016 15:42    EKG:   Normal sinus rhythm IMPRESSION AND PLAN:   Jeffrey Haynes  is a 82 y.o. male with a known history of CAD, COPD, hyperlipidemia comes in the emergency room after he had a fall, mechanical and home while trying to walk on the carpet and slipped on the tile floor. Patient started having significant left hip pain came to the emergency room was found to have left femur fracture.  1. Acute left hip femur fracture status post mechanical fall at home -Patient is at a low intermediate risk for surgery. -EKG shows normal sinus rhythm. -He has good functional capacity. -Continue metoprolol in the perioperative period  2. Hypertension Continue metoprolol  3. COPD appears stable -Continue oral inhalers and nebulizer as needed. -Recommend  incentive spirometer  4. Hypothyroidism -On Synthroid  5. DVT prophylaxis -Patient will be started on Lovenox after surgery  All the records are reviewed and case discussed with ED provider. Management plans discussed with the patient, family and they are in agreement.  CODE STATUS: Full  TOTAL TIME TAKING CARE OF THIS PATIENT: 50 minutes.    Nandika Stetzer M.D on 05/02/2016 at 6:10 PM  Between 7am to 6pm - Pager - [phone]  After 6pm go to [email or web address] - password [email or web address] [facility]   Office  [phone]  CC: Primary care physician; Golden Pop, MD

## 2016-05-02 NOTE — Progress Notes (Signed)
PT AO, denies pain.  Able to move lower extremities and feel sensation to bottom of bilateral feet.  BP 91/63 after first Bolus.  Admin 2nd bolus per Dr.Thomas's order and weaning off neo gtt.

## 2016-05-02 NOTE — Anesthesia Procedure Notes (Addendum)
Spinal Patient location during procedure: OR Staffing Anesthesiologist: Gunnar Bulla Performed by: anesthesiologist  Preanesthetic Checklist Completed: patient identified, site marked, surgical consent, pre-op evaluation, timeout performed, IV checked and risks and benefits discussed Spinal Block Patient position: sitting Prep: Betadine Patient monitoring: heart rate, cardiac monitor, continuous pulse ox and blood pressure Approach: midline Location: L3-4 Injection technique: single-shot Needle Needle type: Pencil-Tip  Needle gauge: 25 G Needle length: 9 cm Assessment Sensory level: T10 Additional Notes 12.5 mg marcaine intrathecally.  Date/Time: 05/02/2016 7:20 PM Performed by: Doreen Salvage Pre-anesthesia Checklist: Patient identified, Emergency Drugs available, Suction available and Patient being monitored Patient Re-evaluated:Patient Re-evaluated prior to inductionOxygen Delivery Method: Simple face mask Intubation Type: IV induction Dental Injury: Teeth and Oropharynx as per pre-operative assessment

## 2016-05-03 LAB — BASIC METABOLIC PANEL
Anion gap: 4 — ABNORMAL LOW (ref 5–15)
BUN: 13 mg/dL (ref 6–20)
CO2: 25 mmol/L (ref 22–32)
Calcium: 7.9 mg/dL — ABNORMAL LOW (ref 8.9–10.3)
Chloride: 109 mmol/L (ref 101–111)
Creatinine, Ser: 0.91 mg/dL (ref 0.61–1.24)
Glucose, Bld: 103 mg/dL — ABNORMAL HIGH (ref 65–99)
POTASSIUM: 4.1 mmol/L (ref 3.5–5.1)
SODIUM: 138 mmol/L (ref 135–145)

## 2016-05-03 LAB — CBC WITH DIFFERENTIAL/PLATELET
BASOS ABS: 0 10*3/uL (ref 0–0.1)
Basophils Relative: 0 %
EOS ABS: 0.1 10*3/uL (ref 0–0.7)
HCT: 36.9 % — ABNORMAL LOW (ref 40.0–52.0)
Hemoglobin: 12.3 g/dL — ABNORMAL LOW (ref 13.0–18.0)
Lymphs Abs: 1.2 10*3/uL (ref 1.0–3.6)
MCH: 32.5 pg (ref 26.0–34.0)
MCHC: 33.4 g/dL (ref 32.0–36.0)
MCV: 97.3 fL (ref 80.0–100.0)
Monocytes Absolute: 0.7 10*3/uL (ref 0.2–1.0)
Monocytes Relative: 8 %
Neutro Abs: 7.6 10*3/uL — ABNORMAL HIGH (ref 1.4–6.5)
Platelets: 161 10*3/uL (ref 150–440)
RBC: 3.79 MIL/uL — AB (ref 4.40–5.90)
RDW: 13.5 % (ref 11.5–14.5)
WBC: 9.6 10*3/uL (ref 3.8–10.6)

## 2016-05-03 MED ORDER — IPRATROPIUM-ALBUTEROL 0.5-2.5 (3) MG/3ML IN SOLN
3.0000 mL | Freq: Two times a day (BID) | RESPIRATORY_TRACT | Status: DC
Start: 2016-05-03 — End: 2016-05-03

## 2016-05-03 MED ORDER — ONDANSETRON HCL 4 MG PO TABS
4.0000 mg | ORAL_TABLET | Freq: Four times a day (QID) | ORAL | Status: DC | PRN
  Administered 2016-05-05: 4 mg via ORAL
  Filled 2016-05-03: qty 1

## 2016-05-03 MED ORDER — ACETAMINOPHEN 650 MG RE SUPP
650.0000 mg | Freq: Four times a day (QID) | RECTAL | Status: DC | PRN
  Filled 2016-05-03: qty 1

## 2016-05-03 MED ORDER — ALBUTEROL SULFATE (2.5 MG/3ML) 0.083% IN NEBU: 2.5000 mg | INHALATION_SOLUTION | Freq: Once | RESPIRATORY_TRACT | Status: DC

## 2016-05-03 MED ORDER — ACETAMINOPHEN 500 MG PO TABS: 1000.0000 mg | ORAL_TABLET | Freq: Four times a day (QID) | ORAL | Status: AC

## 2016-05-03 MED ORDER — DOCUSATE SODIUM 100 MG PO CAPS: 100.0000 mg | ORAL_CAPSULE | Freq: Two times a day (BID) | ORAL | Status: DC

## 2016-05-03 MED ORDER — ACETAMINOPHEN 650 MG RE SUPP: 650.0000 mg | Freq: Four times a day (QID) | RECTAL | Status: DC | PRN

## 2016-05-03 MED ORDER — BISACODYL 10 MG RE SUPP: 10.0000 mg | Freq: Every day | RECTAL | Status: DC | PRN

## 2016-05-03 MED ORDER — ONDANSETRON HCL 4 MG/2ML IJ SOLN: 4.0000 mg | Freq: Four times a day (QID) | INTRAMUSCULAR | Status: DC | PRN

## 2016-05-03 MED ORDER — LEVOTHYROXINE SODIUM 75 MCG PO TABS
75.0000 ug | ORAL_TABLET | Freq: Every day | ORAL | Status: DC
  Administered 2016-05-03 – 2016-05-13 (×10): 75 ug via ORAL
  Filled 2016-05-03 (×11): qty 1

## 2016-05-03 MED ORDER — ALBUTEROL SULFATE (2.5 MG/3ML) 0.083% IN NEBU: 3.0000 mL | INHALATION_SOLUTION | Freq: Four times a day (QID) | RESPIRATORY_TRACT | Status: DC | PRN

## 2016-05-03 MED ORDER — CEFAZOLIN SODIUM-DEXTROSE 2-4 GM/100ML-% IV SOLN
2.0000 g | Freq: Four times a day (QID) | INTRAVENOUS | Status: AC
  Administered 2016-05-03 (×3): 2 g via INTRAVENOUS
  Filled 2016-05-03 (×3): qty 100

## 2016-05-03 MED ORDER — FLEET ENEMA 7-19 GM/118ML RE ENEM
1.0000 | ENEMA | Freq: Once | RECTAL | Status: AC | PRN
  Administered 2016-05-06: 1 via RECTAL

## 2016-05-03 MED ORDER — PANTOPRAZOLE SODIUM 40 MG PO TBEC
40.0000 mg | DELAYED_RELEASE_TABLET | Freq: Two times a day (BID) | ORAL | Status: DC
  Administered 2016-05-03 – 2016-05-09 (×11): 40 mg via ORAL
  Filled 2016-05-03 (×11): qty 1

## 2016-05-03 MED ORDER — ACETAMINOPHEN 325 MG PO TABS: 650.0000 mg | ORAL_TABLET | Freq: Four times a day (QID) | ORAL | Status: DC | PRN

## 2016-05-03 MED ORDER — DIPHENHYDRAMINE HCL 12.5 MG/5ML PO ELIX
12.5000 mg | ORAL_SOLUTION | ORAL | Status: DC | PRN
  Filled 2016-05-03: qty 10

## 2016-05-03 MED ORDER — METOPROLOL SUCCINATE ER 25 MG PO TB24
25.0000 mg | ORAL_TABLET | Freq: Every day | ORAL | Status: DC
  Administered 2016-05-04 – 2016-05-06 (×3): 25 mg via ORAL
  Filled 2016-05-03 (×4): qty 1

## 2016-05-03 MED ORDER — ONDANSETRON HCL 4 MG PO TABS
4.0000 mg | ORAL_TABLET | Freq: Four times a day (QID) | ORAL | Status: DC | PRN
Start: 2016-05-03 — End: 2016-05-03

## 2016-05-03 MED ORDER — ONDANSETRON HCL 4 MG/2ML IJ SOLN
4.0000 mg | Freq: Four times a day (QID) | INTRAMUSCULAR | Status: DC | PRN
  Administered 2016-05-06: 4 mg via INTRAVENOUS
  Filled 2016-05-03: qty 2

## 2016-05-03 MED ORDER — ENOXAPARIN SODIUM 40 MG/0.4ML ~~LOC~~ SOLN
40.0000 mg | SUBCUTANEOUS | Status: DC
  Administered 2016-05-03 – 2016-05-13 (×11): 40 mg via SUBCUTANEOUS
  Filled 2016-05-03 (×11): qty 0.4

## 2016-05-03 MED ORDER — ATORVASTATIN CALCIUM 20 MG PO TABS: 20.0000 mg | ORAL_TABLET | Freq: Every day | ORAL | Status: DC

## 2016-05-03 MED ORDER — IPRATROPIUM-ALBUTEROL 0.5-2.5 (3) MG/3ML IN SOLN
3.0000 mL | Freq: Two times a day (BID) | RESPIRATORY_TRACT | Status: DC
  Administered 2016-05-03 – 2016-05-04 (×3): 3 mL via RESPIRATORY_TRACT
  Filled 2016-05-03 (×3): qty 3

## 2016-05-03 MED ORDER — MORPHINE SULFATE (PF) 2 MG/ML IV SOLN
1.0000 mg | INTRAVENOUS | Status: DC | PRN
  Administered 2016-05-03 – 2016-05-05 (×2): 1 mg via INTRAVENOUS
  Filled 2016-05-03 (×2): qty 1

## 2016-05-03 MED ORDER — OXYCODONE HCL 5 MG PO TABS
5.0000 mg | ORAL_TABLET | ORAL | Status: DC | PRN
  Administered 2016-05-03 – 2016-05-04 (×3): 10 mg via ORAL
  Administered 2016-05-04: 5 mg via ORAL
  Administered 2016-05-04 (×2): 10 mg via ORAL
  Filled 2016-05-03: qty 1
  Filled 2016-05-03 (×5): qty 2

## 2016-05-03 MED ORDER — BISACODYL 5 MG PO TBEC: 5.0000 mg | DELAYED_RELEASE_TABLET | Freq: Every day | ORAL | Status: DC | PRN

## 2016-05-03 MED ORDER — METOCLOPRAMIDE HCL 10 MG PO TABS: 5.0000 mg | ORAL_TABLET | Freq: Three times a day (TID) | ORAL | Status: DC | PRN

## 2016-05-03 MED ORDER — ATORVASTATIN CALCIUM 20 MG PO TABS
20.0000 mg | ORAL_TABLET | Freq: Every day | ORAL | Status: DC
  Administered 2016-05-03 – 2016-05-13 (×11): 20 mg via ORAL
  Filled 2016-05-03 (×11): qty 1

## 2016-05-03 MED ORDER — METOCLOPRAMIDE HCL 5 MG/ML IJ SOLN: 5.0000 mg | Freq: Three times a day (TID) | INTRAMUSCULAR | Status: DC | PRN

## 2016-05-03 MED ORDER — HYDROMORPHONE HCL 1 MG/ML IJ SOLN
0.5000 mg | INTRAMUSCULAR | Status: DC | PRN
  Administered 2016-05-05: 1 mg via INTRAVENOUS
  Filled 2016-05-03: qty 1

## 2016-05-03 MED ORDER — HYDROCODONE-ACETAMINOPHEN 5-325 MG PO TABS
1.0000 | ORAL_TABLET | ORAL | Status: DC | PRN
  Administered 2016-05-03 (×2): 1 via ORAL
  Administered 2016-05-03 (×2): 2 via ORAL
  Administered 2016-05-03 – 2016-05-04 (×2): 1 via ORAL
  Administered 2016-05-05: 2 via ORAL
  Administered 2016-05-06: 1 via ORAL
  Filled 2016-05-03: qty 1
  Filled 2016-05-03: qty 2
  Filled 2016-05-03 (×2): qty 1
  Filled 2016-05-03: qty 2
  Filled 2016-05-03 (×4): qty 1

## 2016-05-03 MED ORDER — ENOXAPARIN SODIUM 40 MG/0.4ML ~~LOC~~ SOLN: 40.0000 mg | SUBCUTANEOUS | Status: DC

## 2016-05-03 MED ORDER — ACETAMINOPHEN 325 MG PO TABS
650.0000 mg | ORAL_TABLET | Freq: Four times a day (QID) | ORAL | Status: DC | PRN
  Administered 2016-05-08: 650 mg via ORAL
  Filled 2016-05-03: qty 2

## 2016-05-03 MED ORDER — SODIUM CHLORIDE 0.9 % IV SOLN
INTRAVENOUS | Status: DC
  Administered 2016-05-03: 01:00:00 via INTRAVENOUS

## 2016-05-03 MED ORDER — MAGNESIUM HYDROXIDE 400 MG/5ML PO SUSP
30.0000 mL | Freq: Every day | ORAL | Status: DC | PRN
  Administered 2016-05-06: 30 mL via ORAL
  Filled 2016-05-03: qty 30

## 2016-05-03 MED ORDER — KCL IN DEXTROSE-NACL 20-5-0.9 MEQ/L-%-% IV SOLN
INTRAVENOUS | Status: DC
  Filled 2016-05-03 (×2): qty 1000

## 2016-05-03 MED ORDER — DOCUSATE SODIUM 100 MG PO CAPS
100.0000 mg | ORAL_CAPSULE | Freq: Two times a day (BID) | ORAL | Status: DC
  Administered 2016-05-03 – 2016-05-08 (×10): 100 mg via ORAL
  Filled 2016-05-03 (×10): qty 1

## 2016-05-03 NOTE — Clinical Social Work Note (Signed)
Clinical Social Work Assessment  Patient Details  Name: Jeffrey Haynes MRN: [id number] Date of Birth: Jun 25, 1925  Date of referral:  05/03/16               Reason for consult:  Facility Placement                Permission sought to share information with:  Family Supports, Customer service manager Permission granted to share information::  Yes, Verbal Permission Granted  Name::     Son Jeffrey Haynes [phone] /SNF  Agency::  yes  Relationship::  yes  Contact Information:  yes  Housing/Transportation Living arrangements for the past 2 months:  [place] of Information:  Patient, Adult Children Patient Interpreter Needed:  None Criminal Activity/Legal Involvement Pertinent to Current Situation/Hospitalization:  No - Comment as needed Significant Relationships:  Adult Children Lives with:  Adult Children Do you feel safe going back to the place where you live?  Yes Need for family participation in patient care:  Yes (Comment)  Care giving concerns:  TBD will interview son Market researcher / plan: LCSW met with patient. He is currently on room air and gave verbal consent to contact his son and facilities. He reports he will likely go to SNF or home, not sure. LCSW educated him on the differences of PT services. He reports he will decide later. Assessment interrupted as PT needed to consult with patient. He is oriented x3 and has good vision,speech and hearing. He is on a regular diet. He gave verbal consent to speak to family and friends and facility.Independent with ADL  Employment status:  Retired Forensic scientist:  Information systems manager ([place] [employer] -[employer]) PT Recommendations:  [place] / Referral to community resources:  [place]  Patient/Family's Response to care:  Son resides with his father at home and would prefer he go to the SNF-rehab  Patient/Family's Understanding of and  Emotional Response to Diagnosis, Current Treatment, and Prognosis:  They understand he will need rehab and his son resides with him and agrees to do what ever his father needs.  Emotional Assessment Appearance:  Appears stated age Attitude/Demeanor/Rapport:   (Pleasant,humorous) Affect (typically observed):  Accepting, Hopeful, Calm Orientation:  Oriented to Self, Oriented to Place, Oriented to  Time, Oriented to Situation Alcohol / Substance use:  Other (previous smoker) Psych involvement (Current and /or in the community):  No (Comment)  Discharge Needs  Concerns to be addressed:  No discharge needs identified Readmission within the last 30 days:  No Current discharge risk:  None Barriers to Discharge:  Continued Medical Work up   Joana Reamer, LCSW 05/03/2016, 11:52 AM

## 2016-05-03 NOTE — NC FL2 (Signed)
[place] LEVEL OF CARE SCREENING TOOL     IDENTIFICATION  Patient Name: Jeffrey Haynes Birthdate: 1925/08/06 Sex: male Admission Date (Current Location): 05/02/2016  [place] and [place] Number:  Engineering geologist and Address:  [facility], [address], [place], [place] [address]      Provider Number: [id number]  Attending Physician Name and Address:  Theodoro Grist, MD  Relative Name and Phone Number:       Current Level of Care: Hospital Recommended Level of Care: [place] Prior Approval Number:    Date Approved/Denied:   PASRR Number:   [id number]   Discharge Plan: SNF    Current Diagnoses: Patient Active Problem List   Diagnosis Date Noted  . Femur fracture ([place]) 05/02/2016  . COPD (chronic obstructive pulmonary disease) ([place]) 11/06/2015  . CAD (coronary artery disease) 11/06/2015  . Hypothyroidism 11/06/2015  . Hyperlipidemia 11/06/2015  . Familial tremor 11/06/2015  . CKD (chronic kidney disease) stage 3, GFR 30-59 ml/min 11/06/2015    Orientation RESPIRATION BLADDER Height & Weight     Self, Time, Situation, Place  Normal Continent Weight: 185 lb (83.915 kg) Height:  5' 9.5" (176.5 cm)  BEHAVIORAL SYMPTOMS/MOOD NEUROLOGICAL BOWEL NUTRITION STATUS      Continent Diet (Regular)  AMBULATORY STATUS COMMUNICATION OF NEEDS Skin   Limited Assist Verbally Bruising                       Personal Care Assistance Level of Assistance  Bathing, Feeding, Dressing, Total care Bathing Assistance: Maximum assistance Feeding assistance: Independent Dressing Assistance: Limited assistance Total Care Assistance: Maximum assistance   Functional Limitations Info  Sight, Hearing, Speech Sight Info: Adequate Hearing Info: Adequate Speech Info: Adequate    SPECIAL CARE FACTORS FREQUENCY  PT (By licensed PT)     PT Frequency: x5              Contractures      Additional Factors Info  Code  Status Code Status Info: full             Current Medications (05/03/2016):  This is the current hospital active medication list Current Facility-Administered Medications  Medication Dose Route Frequency Provider Last Rate Last Dose  . acetaminophen (TYLENOL) tablet 650 mg  650 mg Oral Q6H PRN Fritzi Mandes, MD       Or  . acetaminophen (TYLENOL) suppository 650 mg  650 mg Rectal Q6H PRN Fritzi Mandes, MD      . acetaminophen (TYLENOL) tablet 1,000 mg  1,000 mg Oral Q6H [name], MD   1,000 mg at 05/03/16 0054  . albuterol (PROVENTIL) (2.5 MG/3ML) 0.083% nebulizer solution 2.5 mg  2.5 mg Nebulization Once [name], MD   2.5 mg at 05/03/16 0015  . albuterol (PROVENTIL) (2.5 MG/3ML) 0.083% nebulizer solution 3 mL  3 mL Inhalation Q6H PRN Fritzi Mandes, MD      . atorvastatin (LIPITOR) tablet 20 mg  20 mg Oral Daily Fritzi Mandes, MD   20 mg at 05/03/16 0939  . bisacodyl (DULCOLAX) EC tablet 5 mg  5 mg Oral Daily PRN Fritzi Mandes, MD      . ceFAZolin (ANCEF) IVPB 2g/100 mL premix  2 g Intravenous Q6H [name], MD   2 g at 05/03/16 0939  . diphenhydrAMINE (BENADRYL) 12.5 MG/5ML elixir 12.5-25 mg  12.5-25 mg Oral Q4H PRN [name], MD      . docusate sodium (  COLACE) capsule 100 mg  100 mg Oral BID Fritzi Mandes, MD   100 mg at 05/03/16 8675  . enoxaparin (LOVENOX) injection 40 mg  40 mg Subcutaneous Q24H [name], MD   40 mg at 05/03/16 4492  . HYDROcodone-acetaminophen (NORCO/VICODIN) 5-325 MG per tablet 1-2 tablet  1-2 tablet Oral Q4H PRN Fritzi Mandes, MD   2 tablet at 05/03/16 0951  . HYDROmorphone (DILAUDID) injection 0.5-1 mg  0.5-1 mg Intravenous Q2H PRN [name], MD      . ipratropium-albuterol (DUONEB) 0.5-2.5 (3) MG/3ML nebulizer solution 3 mL  3 mL Inhalation BID Fritzi Mandes, MD   3 mL at 05/03/16 0752  . levothyroxine (SYNTHROID, LEVOTHROID) tablet 75 mcg  75 mcg Oral QAC breakfast Fritzi Mandes, MD   75 mcg at 05/03/16 [phone]  . magnesium hydroxide (MILK OF MAGNESIA) suspension 30 mL   30 mL Oral Daily PRN [name], MD      . metoCLOPramide (REGLAN) tablet 5-10 mg  5-10 mg Oral Q8H PRN [name], MD       Or  . metoCLOPramide (REGLAN) injection 5-10 mg  5-10 mg Intravenous Q8H PRN [name], MD      . metoprolol succinate (TOPROL-XL) 24 hr tablet 25 mg  25 mg Oral Daily Fritzi Mandes, MD   25 mg at 05/03/16 7121  . morphine 2 MG/ML injection 1 mg  1 mg Intravenous Q3H PRN Fritzi Mandes, MD   1 mg at 05/03/16 0237  . ondansetron (ZOFRAN) tablet 4 mg  4 mg Oral Q6H PRN Fritzi Mandes, MD       Or  . ondansetron (ZOFRAN) injection 4 mg  4 mg Intravenous Q6H PRN Fritzi Mandes, MD      . oxyCODONE (Oxy IR/ROXICODONE) immediate release tablet 5-10 mg  5-10 mg Oral Q3H PRN [name], MD      . pantoprazole (PROTONIX) EC tablet 40 mg  40 mg Oral BID AC [name], MD   40 mg at 05/03/16 0939  . sodium phosphate (FLEET) 7-19 GM/118ML enema 1 enema  1 enema Rectal Once PRN [name], MD         Discharge Medications: Please see discharge summary for a list of discharge medications.  Relevant Imaging Results:  Relevant Lab Results:   Additional Information SSN# [id number]  Joana Reamer, [place]

## 2016-05-03 NOTE — Evaluation (Signed)
Physical Therapy Evaluation Patient Details Name: Jeffrey Haynes MRN: [id number] DOB: June 08, 1925 Today's Date: 05/03/2016   History of Present Illness  Normally active 82 y/o male who suffered a fall with L hip fx, needing replacement (posterior approach)  Clinical Impression  Pt showed good effort with PT exam but was very pain limited and struggled more than expected given his active prior level of function.  He was able to do ~10 minutes of exercises apart from the exam but needed assist with nearly all acts and generally had considerable pain t/o the exercises.     Follow Up Recommendations SNF    Equipment Recommendations  Rolling walker with 5" wheels    Recommendations for Other Services       Precautions / Restrictions Precautions Precautions: Fall;Posterior Hip Restrictions Weight Bearing Restrictions: Yes LLE Weight Bearing: Weight bearing as tolerated      Mobility  Bed Mobility Overal bed mobility: Needs Assistance Bed Mobility: Supine to Sit     Supine to sit: Mod assist     General bed mobility comments: Pt shows good effort but has a lot of pain with even minimal movements and needs more assist than expected  Transfers Overall transfer level: Needs assistance Equipment used: Rolling walker (2 wheeled) Transfers: Sit to/from Stand Sit to Stand: Mod assist         General transfer comment: Pt again shows good effort but is weak and needs more assist than he or PT expected.  Pt with a lot of pain in sitting and with attempting to stand.  Ambulation/Gait Ambulation/Gait assistance: Mod assist;Max assist Ambulation Distance (Feet): 2 Feet Assistive device: Rolling walker (2 wheeled)       General Gait Details: Pt is able to take very small, shuffling, turing/side steps to recliner but is very uncomfortable with WBing on the L and ultimately needs a lot of unweighting, extra time and considerable assist to remain upright.  Stairs             Wheelchair Mobility    Modified Rankin (Stroke Patients Only)       Balance                                             Pertinent Vitals/Pain Pain Assessment: 0-10 (reports only soreness at rest) Pain Score: 8     Home Living Family/patient expects to be discharged to:: Private residence Living Arrangements: Children Available Help at Discharge: Family                  Prior Function Level of Independence: Independent         Comments: Pt still drives, push mows the lawn, etc      Hand Dominance        Extremity/Trunk Assessment   Upper Extremity Assessment: Overall WFL for tasks assessed           Lower Extremity Assessment: LLE deficits/detail   LLE Deficits / Details: Pt has a lot of pain with L LE movement.  Shows good effort, but lacks 3/5 strength with all hip acts     Communication   Communication: No difficulties  Cognition Arousal/Alertness: Awake/alert Behavior During Therapy: WFL for tasks assessed/performed Overall Cognitive Status: Within Functional Limits for tasks assessed  General Comments      Exercises Total Joint Exercises Ankle Circles/Pumps: AROM;10 reps Quad Sets: Strengthening;10 reps Gluteal Sets: Strengthening;10 reps Heel Slides: AAROM;5 reps Hip ABduction/ADduction: AAROM;5 reps      Assessment/Plan    PT Assessment Patient needs continued PT services  PT Diagnosis Difficulty walking;Generalized weakness;Acute pain   PT Problem List Decreased strength;Decreased balance;Decreased activity tolerance;Decreased range of motion;Decreased mobility;Decreased coordination;Decreased knowledge of use of DME;Decreased safety awareness;Pain  PT Treatment Interventions DME instruction;Gait training;Stair training;Functional mobility training;Therapeutic activities;Therapeutic exercise;Balance training;Neuromuscular re-education;Patient/family education   PT Goals (Current  goals can be found in the Care Plan section) Acute Rehab PT Goals Patient Stated Goal: get back to working in the yard PT Goal Formulation: With patient Time For Goal Achievement: 05/17/16 Potential to Achieve Goals: Fair    Frequency BID   Barriers to discharge        Co-evaluation               End of Session Equipment Utilized During Treatment: Gait belt Activity Tolerance: Patient limited by pain Patient left: with chair alarm set;with call bell/phone within reach;with family/visitor present           Time: 7425-9563 PT Time Calculation (min) (ACUTE ONLY): 32 min   Charges:   PT Evaluation $PT Eval Moderate Complexity: 1 Procedure PT Treatments $Therapeutic Exercise: 8-22 mins   PT G Codes:        Kreg Shropshire, DPT  05/03/2016, 2:59 PM

## 2016-05-03 NOTE — Progress Notes (Signed)
Spoke with Dr. Roland Rack pt has two orders for iv fluids. Dr. Roland Rack would like the normal Saline 44m/hr.

## 2016-05-03 NOTE — Anesthesia Postprocedure Evaluation (Signed)
Anesthesia Post Note  Patient: Jeffrey Haynes  Procedure(s) Performed: Procedure(s) (LRB): ARTHROPLASTY BIPOLAR HIP (HEMIARTHROPLASTY) (Left)  Patient location during evaluation: Nursing Unit Anesthesia Type: Spinal Level of consciousness: oriented and awake and alert Pain management: pain level controlled Vital Signs Assessment: post-procedure vital signs reviewed and stable Respiratory status: spontaneous breathing, respiratory function stable and patient connected to nasal cannula oxygen Cardiovascular status: blood pressure returned to baseline and stable Postop Assessment: no headache, no backache and patient able to bend at knees Anesthetic complications: no    Last Vitals:  Filed Vitals:   05/03/16 0447 05/03/16 0739  BP: 109/45 115/43  Pulse: 70 77  Temp: 36.8 C 37 C  Resp: 18 20    Last Pain:  Filed Vitals:   05/03/16 0739  PainSc: 3                  Precious Haws Cyenna Rebello

## 2016-05-03 NOTE — Progress Notes (Addendum)
   Subjective: 1 Day Post-Op Procedure(s) (LRB): ARTHROPLASTY BIPOLAR HIP (HEMIARTHROPLASTY) (Left) Patient reports pain as mild.   Patient is well, and has had no acute complaints or problems We will start therapy today.  Plan is to go Rehab after hospital stay. no nausea and no vomiting Patient denies any chest pains or shortness of breath. Patient is using incentive spirometer. Has a history of COPD. Patient resting very well. No complaints. Feels much better than he did prior to surgery.  Objective: Vital signs in last 24 hours: Temp:  [97.7 F (36.5 C)-98.7 F (37.1 C)] 98.2 F (36.8 C) (05/27 0447) Pulse Rate:  [51-75] 70 (05/27 0447) Resp:  [12-19] 18 (05/27 0447) BP: (64-176)/(37-99) 109/45 mmHg (05/27 0447) SpO2:  [92 %-100 %] 93 % (05/27 0447) Weight:  [83.915 kg (185 lb)] 83.915 kg (185 lb) (05/26 1431) well approximated incision Heels are non tender and elevated off the bed using rolled towels Intake/Output from previous day: 05/26 0701 - 05/27 0700 In: 3325 [I.V.:2225; IV Piggyback:1100] Out: 2055 [Urine:1905; Blood:150] Intake/Output this shift: Total I/O In: 3325 [I.V.:2225; IV Piggyback:1100] Out: 2055 [Urine:1905; Blood:150]   Recent Labs  05/02/16 1543 05/03/16 0453  HGB 14.1 12.3*    Recent Labs  05/02/16 1543 05/03/16 0453  WBC 11.0* 9.6  RBC 4.40 3.79*  HCT 41.6 36.9*  PLT 186 161    Recent Labs  05/02/16 1543 05/03/16 0453  NA 137 138  K 5.2* 4.1  CL 106 109  CO2 24 25  BUN 15 13  CREATININE 0.95 0.91  GLUCOSE 98 103*  CALCIUM 9.0 7.9*   No results for input(s): LABPT, INR in the last 72 hours.  EXAM General - Patient is Alert, Appropriate and Oriented Extremity - Neurologically intact Neurovascular intact Sensation intact distally Intact pulses distally Compartment soft Dressing - dressing C/D/I Motor Function - intact, moving foot and toes well on exam.   Past Medical History  Diagnosis Date  . COPD (chronic  obstructive pulmonary disease) ([place])   . CAD (coronary artery disease)   . Hyperlipidemia   . Familial tremor   . Bleeding ulcer     Assessment/Plan: 1 Day Post-Op Procedure(s) (LRB): ARTHROPLASTY BIPOLAR HIP (HEMIARTHROPLASTY) (Left) Active Problems:   Femur fracture ([facility])  Estimated body mass index is 26.94 kg/(m^2) as calculated from the following:   Height as of this encounter: 5' 9.5" (1.765 m).   Weight as of this encounter: 83.915 kg (185 lb). Advance diet Up with therapy D/C IV fluids Discharge to SNF  Labs: Were reviewed. DVT Prophylaxis - Lovenox, Foot Pumps and TED hose Weight-Bearing as tolerated to left leg D/C O2 and Pulse OX and try on Room Air Begin working on having a bowel movement Labs tomorrow morning Patient will need to follow-up in [facility] 6 weeks postop with Dr. Roland Rack. Staples will need to be removed from left hip 2 weeks postop and apply benzoin and half-inch Steri-Strips. Patient is not to take a shower or get the incision wet until the staples are removed. Posterior hip precautions are in place.  Jillyn Ledger. [place] 05/03/2016, 6:51 AM

## 2016-05-03 NOTE — Anesthesia Post-op Follow-up Note (Signed)
  Anesthesia Pain Follow-up Note  Patient: Jeffrey Haynes  Day #: 1  Date of Follow-up: 05/03/2016 Time: 8:34 AM  Last Vitals:  Filed Vitals:   05/03/16 0447 05/03/16 0739  BP: 109/45 115/43  Pulse: 70 77  Temp: 36.8 C 37 C  Resp: 18 20    Level of Consciousness: alert  Pain: mild   Side Effects:None  Catheter Site Exam:clean, dry  Plan: D/C from anesthesia care  [place]

## 2016-05-03 NOTE — Progress Notes (Signed)
[facility] [place] NAME: Jeffrey Haynes    MR#:  [id number]  DATE OF BIRTH:  1925-09-04  SUBJECTIVE:  CHIEF COMPLAINT:   Chief Complaint  Patient presents with  . Fall  Patient is 82 year old Caucasian male with medical history significant for the history of COPD, coronary artery disease, hyperlipidemia, familial tremor, peptic ulcer disease who presents to the hospital with complaints of fall after he slipped on the tile floor. Patient was brought to emergency room in he was found to have left femur fracture. He underwent left hemiarthroplasty on 05/03/2016, complains of left hip pain, but overall feels comfortable. Denies any other symptoms  Review of Systems  Constitutional: Negative for fever, chills and weight loss.  HENT: Negative for congestion.   Eyes: Negative for blurred vision and double vision.  Respiratory: Negative for cough, sputum production, shortness of breath and wheezing.   Cardiovascular: Negative for chest pain, palpitations, orthopnea, leg swelling and PND.  Gastrointestinal: Negative for nausea, vomiting, abdominal pain, diarrhea, constipation and blood in stool.  Genitourinary: Negative for dysuria, urgency, frequency and hematuria.  Musculoskeletal: Positive for joint pain. Negative for falls.  Neurological: Negative for dizziness, tremors, focal weakness and headaches.  Endo/Heme/Allergies: Does not bruise/bleed easily.  Psychiatric/Behavioral: Negative for depression. The patient does not have insomnia.     VITAL SIGNS: Blood pressure 115/43, pulse 77, temperature 98.6 F (37 C), temperature source Oral, resp. rate 20, height 5' 9.5" (1.765 m), weight 83.915 kg (185 lb), SpO2 93 %.  PHYSICAL EXAMINATION:   GENERAL:  82 y.o.-year-old patient lying in the bed with no acute distress.  EYES: Pupils equal, round, reactive to light and accommodation. No scleral icterus. Extraocular muscles intact.  HEENT: Head  atraumatic, normocephalic. Oropharynx and nasopharynx clear.  NECK:  Supple, no jugular venous distention. No thyroid enlargement, no tenderness.  LUNGS: Normal breath sounds bilaterally, no wheezing, rales,rhonchi or crepitation. No use of accessory muscles of respiration.  CARDIOVASCULAR: S1, S2 normal. No murmurs, rubs, or gallops.  ABDOMEN: Soft, nontender, nondistended. Bowel sounds present. No organomegaly or mass.  EXTREMITIES: No pedal edema, cyanosis, or clubbing. Left hip dressing is intact. No bleeding, swelling or pain on palpation NEUROLOGIC: Cranial nerves II through XII are intact. Muscle strength 5/5 in all extremities. Sensation intact. Gait not checked.  PSYCHIATRIC: The patient is alert and oriented x 3.  SKIN: No obvious rash, lesion, or ulcer.   ORDERS/RESULTS REVIEWED:   CBC  Recent Labs Lab 05/02/16 1543 05/03/16 0453  WBC 11.0* 9.6  HGB 14.1 12.3*  HCT 41.6 36.9*  PLT 186 161  MCV 94.4 97.3  MCH 32.0 32.5  MCHC 34.0 33.4  RDW 14.1 13.5  LYMPHSABS 1.8 1.2  MONOABS 0.8 0.7  EOSABS 0.1 0.1  BASOSABS 0.0 0.0   ------------------------------------------------------------------------------------------------------------------  Chemistries   Recent Labs Lab 05/02/16 1543 05/03/16 0453  NA 137 138  K 5.2* 4.1  CL 106 109  CO2 24 25  GLUCOSE 98 103*  BUN 15 13  CREATININE 0.95 0.91  CALCIUM 9.0 7.9*  AST 25  --   ALT 11*  --   ALKPHOS 80  --   BILITOT 1.5*  --    ------------------------------------------------------------------------------------------------------------------ estimated creatinine clearance is 57.1 mL/min (by C-G formula based on Cr of 0.91). ------------------------------------------------------------------------------------------------------------------ No results for input(s): TSH, T4TOTAL, T3FREE, THYROIDAB in the last 72 hours.  Invalid input(s): FREET3  Cardiac Enzymes No results for input(s): CKMB, TROPONINI, MYOGLOBIN  in the last  168 hours.  Invalid input(s): CK ------------------------------------------------------------------------------------------------------------------ Invalid input(s): POCBNP ---------------------------------------------------------------------------------------------------------------  RADIOLOGY: Dg Chest 1 View  05/02/2016  CLINICAL DATA:  Fall today. EXAM: CHEST 1 VIEW COMPARISON:  10/03/2012 FINDINGS: There are fibrotic changes within the lungs. Mild hyperinflation. Heart is normal size. No acute airspace opacities or effusions. No acute bony abnormality. IMPRESSION: COPD, fibrotic changes.  No active disease. Electronically Signed   By: Rolm Baptise M.D.   On: 05/02/2016 15:45   Dg Pelvis 1-2 Views  05/02/2016  CLINICAL DATA:  Walking on a corner and slipped, status post fall EXAM: PELVIS - 1-2 VIEW COMPARISON:  None. FINDINGS: Generalized osteopenia. Mildly displaced left femoral neck fracture. No hip dislocation. No other fracture or dislocation. IMPRESSION: Mildly displaced left femoral neck fracture. Electronically Signed   By: Kathreen Devoid   On: 05/02/2016 15:49   Dg Hip Port Unilat With Pelvis 1v Left  05/03/2016  CLINICAL DATA:  Status post left hip arthroplasty placement. Initial encounter. EXAM: DG HIP (WITH OR WITHOUT PELVIS) 1V PORT LEFT COMPARISON:  Left femur radiographs performed earlier today at 3:12 p.m. FINDINGS: There has been interval placement of a left hip arthroplasty, which appears intact, without evidence of loosening. Overlying postoperative soft tissue air and scattered skin staples are seen. The right hip joint is unremarkable in appearance. The visualized bowel gas pattern is grossly unremarkable. IMPRESSION: Interval placement of left hip arthroplasty, which appears intact, without evidence of loosening. Electronically Signed   By: Garald Balding M.D.   On: 05/03/2016 01:29   Dg Femur Min 2 Views Left  05/02/2016  CLINICAL DATA:  Left femur pain after  falling today. Initial encounter. EXAM: LEFT FEMUR 2 VIEWS COMPARISON:  None. FINDINGS: Moderately displaced proximal left femoral neck fracture is noted. The remaining portion of the femur appears normal. IMPRESSION: Moderate displaced proximal left femoral neck fracture. Electronically Signed   By: Marijo Conception, M.D.   On: 05/02/2016 15:42    EKG:  Orders placed or performed during the hospital encounter of 05/02/16  . ED EKG  . ED EKG  . EKG 12-Lead  . EKG 12-Lead    ASSESSMENT AND PLAN:  Active Problems:   Femur fracture ([place]) #1. Mildly displaced left femoral neck fracture, status post left hip arthroplasty on 05/03/2016 by Dr. Roland Rack, continue pain control, adequate, continue anticoagulation with Lovenox at 40 mg subcutaneously once a day, patient may need to go to skilled nursing facility for rehabilitation #2. Acute posthemorrhagic anemia, about 2 g hemoglobin drop after surgery, follow closely and transfuse patient as needed #3. Leukocytosis, likely reactive, resolved #4. History of coronary artery disease, stable, continue outpatient medications #5. Hyperkalemia, resolved   Management plans discussed with the patient, family and they are in agreement.   DRUG ALLERGIES: No Known Allergies  CODE STATUS:     Code Status Orders        Start     Ordered   05/03/16 0015  Full code   Continuous     05/03/16 0014    Code Status History    Date Active Date Inactive Code Status Order ID Comments User Context   This patient has a current code status but no historical code status.    Advance Directive Documentation        Most Recent Value   Type of Advance Directive  Healthcare Power of Attorney   Pre-existing out of facility DNR order (yellow form or pink MOST form)     "MOST" Form in  Place?        TOTAL TIME TAKING CARE OF THIS PATIENT: 40 minutes.   Discussed this patient's son, all questions were answered Areesha Dehaven M.D on 05/03/2016 at 2:44 PM  Between  7am to 6pm - Pager - [phone]  After 6pm go to [email or web address] - password [email or web address] [place] [facility]  Office  [phone]  CC: Primary care physician; Golden Pop, MD

## 2016-05-03 NOTE — Progress Notes (Signed)
Physical Therapy Treatment Patient Details Name: Jeffrey Haynes MRN: [id number] DOB: 1925-07-15 Today's Date: 05/03/2016    History of Present Illness Normally active 82 y/o male who suffered a fall with L hip fx, needing replacement (posterior approach)    PT Comments    Pt did better with exercises this afternoon and seemed less pain limited with those.  However still struggles significantly with WBing/standing/ambulation and needs mod+ assist to maintain in standing and overall showing significant limitations with mobility and positional tolerance.  Follow Up Recommendations  SNF     Equipment Recommendations  Rolling walker with 5" wheels    Recommendations for Other Services       Precautions / Restrictions Precautions Precautions: Fall;Posterior Hip Restrictions Weight Bearing Restrictions: Yes LLE Weight Bearing: Weight bearing as tolerated    Mobility  Bed Mobility Overal bed mobility: Needs Assistance Bed Mobility: Sit to Supine     Supine to sit: Mod assist     General bed mobility comments: again good effort, but with a lot of pain, pt needs considerable assist to get LEs back into bed  Transfers Overall transfer level: Needs assistance Equipment used: Rolling walker (2 wheeled) Transfers: Sit to/from Stand Sit to Stand: Mod assist         General transfer comment: Pt needing heavy cuing for positioning and set up and though he is able to raise himself slightly off the chair surface he needs assist to get to upright  Ambulation/Gait Ambulation/Gait assistance: Mod assist Ambulation Distance (Feet): 2 Feet Assistive device: Rolling walker (2 wheeled)       General Gait Details: Pt again with poor ability to shift weight, take steps or generally do any real ambulation.  He is leaning to the R and struggles with maintaining balance/posture and ability to bear weight effectively on L LE   Stairs            Wheelchair Mobility    Modified  Rankin (Stroke Patients Only)       Balance                                    Cognition Arousal/Alertness: Awake/alert Behavior During Therapy: WFL for tasks assessed/performed Overall Cognitive Status: Within Functional Limits for tasks assessed                      Exercises Total Joint Exercises Ankle Circles/Pumps: AROM;10 reps Quad Sets: Strengthening;10 reps Gluteal Sets: Strengthening;10 reps Short Arc Quad: AROM;10 reps Heel Slides: AROM;10 reps Hip ABduction/ADduction: AAROM;AROM;10 reps    General Comments        Pertinent Vitals/Pain Pain Assessment: 0-10 (reports only soreness at rest) Pain Score: 6     Home Living Family/patient expects to be discharged to:: Private residence Living Arrangements: Children Available Help at Discharge: Family                Prior Function Level of Independence: Independent      Comments: Pt still drives, push mows the lawn, etc    PT Goals (current goals can now be found in the care plan section) Acute Rehab PT Goals Patient Stated Goal: get back to working in the yard PT Goal Formulation: With patient Time For Goal Achievement: 05/17/16 Potential to Achieve Goals: Fair Progress towards PT goals: Progressing toward goals    Frequency  BID    PT Plan Current plan remains appropriate  Co-evaluation             End of Session Equipment Utilized During Treatment: Gait belt Activity Tolerance: Patient limited by pain Patient left: with bed alarm set;with call bell/phone within reach;with family/visitor present     Time: 1312-1340 PT Time Calculation (min) (ACUTE ONLY): 28 min  Charges:  $Gait Training: 8-22 mins $Therapeutic Exercise: 8-22 mins                    G Codes:      Kreg Shropshire, DPT 05/03/2016, 3:10 PM

## 2016-05-04 ENCOUNTER — Inpatient Hospital Stay: Payer: Medicare Other

## 2016-05-04 ENCOUNTER — Encounter: Payer: Self-pay | Admitting: Radiology

## 2016-05-04 LAB — BASIC METABOLIC PANEL
ANION GAP: 5 (ref 5–15)
BUN: 11 mg/dL (ref 6–20)
CHLORIDE: 102 mmol/L (ref 101–111)
CO2: 27 mmol/L (ref 22–32)
Calcium: 8.1 mg/dL — ABNORMAL LOW (ref 8.9–10.3)
Creatinine, Ser: 1.11 mg/dL (ref 0.61–1.24)
GFR calc Af Amer: >60 mL/min (ref >60)
GFR calc non Af Amer: 57 mL/min — ABNORMAL LOW (ref >60)
Glucose, Bld: 110 mg/dL — ABNORMAL HIGH (ref 65–99)
Potassium: 4.2 mmol/L (ref 3.5–5.1)
SODIUM: 134 mmol/L — AB (ref 135–145)

## 2016-05-04 LAB — TROPONIN I: Troponin I: <0.03 ng/mL (ref <0.031)

## 2016-05-04 MED ORDER — BISACODYL 5 MG PO TBEC
5.0000 mg | DELAYED_RELEASE_TABLET | Freq: Every day | ORAL | Status: DC
  Administered 2016-05-05 – 2016-05-11 (×7): 5 mg via ORAL
  Filled 2016-05-04 (×7): qty 1

## 2016-05-04 MED ORDER — AMIODARONE LOAD VIA INFUSION
150.0000 mg | Freq: Once | INTRAVENOUS | Status: AC
  Administered 2016-05-04: 150 mg via INTRAVENOUS
  Filled 2016-05-04: qty 83.34

## 2016-05-04 MED ORDER — AMIODARONE HCL IN DEXTROSE 360-4.14 MG/200ML-% IV SOLN
30.0000 mg/h | INTRAVENOUS | Status: DC
  Administered 2016-05-05 (×2): 30 mg/h via INTRAVENOUS
  Filled 2016-05-04 (×3): qty 200

## 2016-05-04 MED ORDER — POLYETHYLENE GLYCOL 3350 17 G PO PACK
17.0000 g | PACK | Freq: Every day | ORAL | Status: DC | PRN
  Administered 2016-05-06 – 2016-05-11 (×4): 17 g via ORAL
  Filled 2016-05-04 (×3): qty 1

## 2016-05-04 MED ORDER — IOPAMIDOL (ISOVUE-370) INJECTION 76%
75.0000 mL | Freq: Once | INTRAVENOUS | Status: AC | PRN
  Administered 2016-05-04: 75 mL via INTRAVENOUS

## 2016-05-04 MED ORDER — IPRATROPIUM-ALBUTEROL 0.5-2.5 (3) MG/3ML IN SOLN
3.0000 mL | RESPIRATORY_TRACT | Status: DC
  Administered 2016-05-04 – 2016-05-06 (×13): 3 mL via RESPIRATORY_TRACT
  Filled 2016-05-04 (×15): qty 3

## 2016-05-04 MED ORDER — SODIUM CHLORIDE 0.9 % IV SOLN: Freq: Once | INTRAVENOUS | Status: DC

## 2016-05-04 MED ORDER — AMIODARONE HCL IN DEXTROSE 360-4.14 MG/200ML-% IV SOLN
60.0000 mg/h | INTRAVENOUS | Status: DC
  Administered 2016-05-04 – 2016-05-05 (×2): 60 mg/h via INTRAVENOUS
  Filled 2016-05-04: qty 200

## 2016-05-04 NOTE — Progress Notes (Addendum)
   Subjective: 2 Days Post-Op Procedure(s) (LRB): ARTHROPLASTY BIPOLAR HIP (HEMIARTHROPLASTY) (Left) Patient reports pain as moderate.  Complains of been extremely sore to the left hip  Patient is well, and has had no acute complaints or problems We will start therapy today.  Plan is to go Rehab after hospital stay. no nausea and no vomiting Patient denies any chest pains or shortness of breath. Objective: Vital signs in last 24 hours: Temp:  [97.8 F (36.6 C)-99.6 F (37.6 C)] 99.2 F (37.3 C) (05/28 0357) Pulse Rate:  [77-90] 89 (05/28 0357) Resp:  [18-20] 18 (05/28 0357) BP: (115-133)/(43-59) 133/59 mmHg (05/28 0357) SpO2:  [85 %-97 %] 97 % (05/28 0357) well approximated incision Heels are non tender and elevated off the bed using rolled towels Intake/Output from previous day: 05/27 0701 - 05/28 0700 In: 600 [P.O.:600] Out: 1350 [Urine:1350] Intake/Output this shift: Total I/O In: -  Out: 200 [Urine:200]   Recent Labs  05/02/16 1543 05/03/16 0453  HGB 14.1 12.3*    Recent Labs  05/02/16 1543 05/03/16 0453  WBC 11.0* 9.6  RBC 4.40 3.79*  HCT 41.6 36.9*  PLT 186 161    Recent Labs  05/03/16 0453 05/04/16 0509  NA 138 134*  K 4.1 4.2  CL 109 102  CO2 25 27  BUN 13 11  CREATININE 0.91 1.11  GLUCOSE 103* 110*  CALCIUM 7.9* 8.1*   No results for input(s): LABPT, INR in the last 72 hours.  EXAM General - Patient is Alert, Appropriate and Oriented Extremity - Neurologically intact Neurovascular intact Sensation intact distally Intact pulses distally Dorsiflexion/Plantar flexion intact Compartment soft  Patient is noted to be extremely tender to palpation diffusely to the lateral hip and thigh region. Dressing - dressing C/D/I Motor Function - intact, moving foot and toes well on exam.    Past Medical History  Diagnosis Date  . COPD (chronic obstructive pulmonary disease) ([place])   . CAD (coronary artery disease)   . Hyperlipidemia   . Familial  tremor   . Bleeding ulcer     Assessment/Plan: 2 Days Post-Op Procedure(s) (LRB): ARTHROPLASTY BIPOLAR HIP (HEMIARTHROPLASTY) (Left) Active Problems:   Femur fracture ([facility])  Estimated body mass index is 26.94 kg/(m^2) as calculated from the following:   Height as of this encounter: 5' 9.5" (1.765 m).   Weight as of this encounter: 83.915 kg (185 lb). Up with therapy Plan for discharge tomorrow Discharge to SNF  Labs: Were reviewed DVT Prophylaxis - Lovenox, Foot Pumps and TED hose Weight-Bearing as tolerated to left leg Patient needs to have a bowel movement today. Patient will need to follow-up with Dr. Roland Rack in 6 weeks. Dressing will need to be changed 2 weeks postop. Applied benzoin and half-inch Steri-Strips. Continue Lovenox 30 mg every 12 hours for 14 days then discontinue and begin taking one 81 mg enteric-coated aspirin per day unless contraindicated.  Jillyn Ledger. [place] 05/04/2016, 6:25 AM

## 2016-05-04 NOTE — Progress Notes (Signed)
Clinical Education officer, museum (CSW) met with patient and his son was at bedside. CSW presented bed offers. Patient prefers [facility] has not responded to the referral yet. Son is agreeable to [facility] if [facility] does not offer a bed. CSW will continue to follow and assist as needed.   Blima Rich, LCSW [phone]

## 2016-05-04 NOTE — Progress Notes (Signed)
Pts. Heart rate is 130. Dr. Marcille Blanco notified and ordered 1 bolus NS and stat ekg.

## 2016-05-04 NOTE — Progress Notes (Addendum)
[facility] [place] NAME: Jeffrey Haynes    MR#:  [id number]  DATE OF BIRTH:  [birth year]  SUBJECTIVE:  CHIEF COMPLAINT:   Chief Complaint  Patient presents with  . Fall  Patient is 82 year old Caucasian male with medical history significant for the history of COPD, coronary artery disease, hyperlipidemia, familial tremor, peptic ulcer disease who presents to the hospital with complaints of fall after he slipped on the tile floor. Patient was brought to emergency room in he was found to have left femur fracture. He underwent left hemiarthroplasty on 05/03/2016, Some complains of left hip pain, but overall feels comfortable. Admits of some nausea today, abdominal discomfort after eating breakfast. Last bowel movement was 2 days ago. Review of Systems  Constitutional: Negative for fever, chills and weight loss.  HENT: Negative for congestion.   Eyes: Negative for blurred vision and double vision.  Respiratory: Negative for cough, sputum production, shortness of breath and wheezing.   Cardiovascular: Negative for chest pain, palpitations, orthopnea, leg swelling and PND.  Gastrointestinal: Negative for nausea, vomiting, abdominal pain, diarrhea, constipation and blood in stool.  Genitourinary: Negative for dysuria, urgency, frequency and hematuria.  Musculoskeletal: Positive for joint pain. Negative for falls.  Neurological: Negative for dizziness, tremors, focal weakness and headaches.  Endo/Heme/Allergies: Does not bruise/bleed easily.  Psychiatric/Behavioral: Negative for depression. The patient does not have insomnia.     VITAL SIGNS: Blood pressure 119/55, pulse 81, temperature 98.6 F (37 C), temperature source Oral, resp. rate 18, height 5' 9.5" (1.765 m), weight 83.915 kg (185 lb), SpO2 92 %.  PHYSICAL EXAMINATION:   GENERAL:  82 y.o.-year-old patient lying in the bed with no acute distress.  EYES: Pupils equal, round, reactive to  light and accommodation. No scleral icterus. Extraocular muscles intact.  HEENT: Head atraumatic, normocephalic. Oropharynx and nasopharynx clear.  NECK:  Supple, no jugular venous distention. No thyroid enlargement, no tenderness.  LUNGS: diminished breath sounds bilaterally, diffuse wheezing, no rales,rhonchi or crepitations. No use of accessory muscles of respiration.  CARDIOVASCULAR: S1, S2 normal. No murmurs, rubs, or gallops.  ABDOMEN: Soft, tender in left side of the abdomen , however, inconsistent on palpation, no rebound or guarding nondistended. Bowel sounds present. No organomegaly or mass.  EXTREMITIES: No pedal edema, cyanosis, or clubbing. Left hip dressing is intact. No bleeding, swelling or pain on palpation NEUROLOGIC: Cranial nerves II through XII are intact. Muscle strength 5/5 in all extremities. Sensation intact. Gait not checked.  PSYCHIATRIC: The patient is alert and oriented x 3.  SKIN: No obvious rash, lesion, or ulcer.   ORDERS/RESULTS REVIEWED:   CBC  Recent Labs Lab 05/02/16 1543 05/03/16 0453  WBC 11.0* 9.6  HGB 14.1 12.3*  HCT 41.6 36.9*  PLT 186 161  MCV 94.4 97.3  MCH 32.0 32.5  MCHC 34.0 33.4  RDW 14.1 13.5  LYMPHSABS 1.8 1.2  MONOABS 0.8 0.7  EOSABS 0.1 0.1  BASOSABS 0.0 0.0   ------------------------------------------------------------------------------------------------------------------  Chemistries   Recent Labs Lab 05/02/16 1543 05/03/16 0453 05/04/16 0509  NA 137 138 134*  K 5.2* 4.1 4.2  CL 106 109 102  CO2 '24 25 27  '$ GLUCOSE 98 103* 110*  BUN '15 13 11  '$ CREATININE 0.95 0.91 1.11  CALCIUM 9.0 7.9* 8.1*  AST 25  --   --   ALT 11*  --   --   ALKPHOS 80  --   --   BILITOT 1.5*  --   --    ------------------------------------------------------------------------------------------------------------------  estimated creatinine clearance is 46.8 mL/min (by C-G formula based on Cr of  1.11). ------------------------------------------------------------------------------------------------------------------ No results for input(s): TSH, T4TOTAL, T3FREE, THYROIDAB in the last 72 hours.  Invalid input(s): FREET3  Cardiac Enzymes No results for input(s): CKMB, TROPONINI, MYOGLOBIN in the last 168 hours.  Invalid input(s): CK ------------------------------------------------------------------------------------------------------------------ Invalid input(s): POCBNP ---------------------------------------------------------------------------------------------------------------  RADIOLOGY: Dg Chest 1 View  05/02/2016  CLINICAL DATA:  Fall today. EXAM: CHEST 1 VIEW COMPARISON:  10/03/2012 FINDINGS: There are fibrotic changes within the lungs. Mild hyperinflation. Heart is normal size. No acute airspace opacities or effusions. No acute bony abnormality. IMPRESSION: COPD, fibrotic changes.  No active disease. Electronically Signed   By: Rolm Baptise M.D.   On: 05/02/2016 15:45   Dg Pelvis 1-2 Views  05/02/2016  CLINICAL DATA:  Walking on a corner and slipped, status post fall EXAM: PELVIS - 1-2 VIEW COMPARISON:  None. FINDINGS: Generalized osteopenia. Mildly displaced left femoral neck fracture. No hip dislocation. No other fracture or dislocation. IMPRESSION: Mildly displaced left femoral neck fracture. Electronically Signed   By: Kathreen Devoid   On: 05/02/2016 15:49   Dg Hip Port Unilat With Pelvis 1v Left  05/03/2016  CLINICAL DATA:  Status post left hip arthroplasty placement. Initial encounter. EXAM: DG HIP (WITH OR WITHOUT PELVIS) 1V PORT LEFT COMPARISON:  Left femur radiographs performed earlier today at 3:12 p.m. FINDINGS: There has been interval placement of a left hip arthroplasty, which appears intact, without evidence of loosening. Overlying postoperative soft tissue air and scattered skin staples are seen. The right hip joint is unremarkable in appearance. The visualized bowel  gas pattern is grossly unremarkable. IMPRESSION: Interval placement of left hip arthroplasty, which appears intact, without evidence of loosening. Electronically Signed   By: Garald Balding M.D.   On: 05/03/2016 01:29   Dg Femur Min 2 Views Left  05/02/2016  CLINICAL DATA:  Left femur pain after falling today. Initial encounter. EXAM: LEFT FEMUR 2 VIEWS COMPARISON:  None. FINDINGS: Moderately displaced proximal left femoral neck fracture is noted. The remaining portion of the femur appears normal. IMPRESSION: Moderate displaced proximal left femoral neck fracture. Electronically Signed   By: Marijo Conception, M.D.   On: 05/02/2016 15:42    EKG:  Orders placed or performed during the hospital encounter of 05/02/16  . ED EKG  . ED EKG  . EKG 12-Lead  . EKG 12-Lead    ASSESSMENT AND PLAN:  Active Problems:   Femur fracture ([place]) #1. Mildly displaced left femoral neck fracture, status post left hip arthroplasty on 05/03/2016 by Dr. Roland Rack, continue pain control, adequate, continue anticoagulation with Lovenox at 40 mg subcutaneously once a day, patient Will need to go to skilled nursing facility for rehabilitation, per physical therapist recommendations #2. Acute posthemorrhagic anemia, about 2 g hemoglobin drop after surgery, follow tomorrow morning and transfuse patient as needed #3. Leukocytosis, likely reactive, resolved #4. History of coronary artery disease, stable, continue outpatient medications #5. Hyperkalemia, resolved #6. Nausea, initiate full liquid diet, advance diet as tolerated #7. Constipation, advance oral medications, enema as needed  #8 COPD exacerbation, initiate on Duonebs, budesonide, follow clinically  Management plans discussed with the patient, family and they are in agreement.   DRUG ALLERGIES: No Known Allergies  CODE STATUS:     Code Status Orders        Start     Ordered   05/03/16 0015  Full code   Continuous     05/03/16 0014    Code  Status History     Date Active Date Inactive Code Status Order ID Comments User Context   This patient has a current code status but no historical code status.    Advance Directive Documentation        Most Recent Value   Type of Advance Directive  Healthcare Power of Attorney   Pre-existing out of facility DNR order (yellow form or pink MOST form)     "MOST" Form in Place?        TOTAL TIME TAKING CARE OF THIS PATIENT: 40 minutes.   Discussed this patient's son Today again, all questions were answered Benn Tarver M.D on 05/04/2016 at 2:39 PM  Between 7am to 6pm - Pager - [phone]  After 6pm go to [email or web address] - password [email or web address] [facility]  Office  [phone]  CC: Primary care physician; Golden Pop, MD

## 2016-05-04 NOTE — Progress Notes (Signed)
Abnormal EKG. Dr. Marcille Blanco notified and troponins ordered

## 2016-05-04 NOTE — Progress Notes (Signed)
Dr. Marcille Blanco ordered telemetry

## 2016-05-04 NOTE — Progress Notes (Signed)
Physical Therapy Treatment Patient Details Name: Jeffrey Haynes MRN: [id number] DOB: 1925-07-26 Today's Date: 05/04/2016    History of Present Illness Normally active 82 y/o male who suffered a fall with L hip fx, needing replacement (posterior approach)    PT Comments    Pt continues to have little/no pain at rest but with even light exercise and especially with standing but has a lot of pain.  He shows good effort with ambulation and was actually able to take a few steps and go more than 10 ft, but ultimately is he very limited secondary to discomfort taking weight through L and with heavy reliance on walker during limping gait.  Pt shows good effort but is functionally making slow gains (specifically with WBing/ambulation).  Follow Up Recommendations  SNF     Equipment Recommendations  Rolling walker with 5" wheels    Recommendations for Other Services       Precautions / Restrictions Precautions Precautions: Fall;Posterior Hip Precaution Booklet Issued: Yes (comment) Restrictions LLE Weight Bearing: Weight bearing as tolerated    Mobility  Bed Mobility Overal bed mobility: Needs Assistance Bed Mobility: Sit to Supine     Supine to sit: Min assist     General bed mobility comments: Pt better able to shift hips to EOB and get torso upright though he still does require some assist. Pt tolerates sitting pressure on L hip better today too.  Transfers Overall transfer level: Needs assistance Equipment used: Rolling walker (2 wheeled) Transfers: Sit to/from Stand Sit to Stand: Min assist         General transfer comment: Pt again slow and hesitant with transition to standing needing a lot of cuing and encouragement, but overall he was able to rise needing much less assist today.  Ambulation/Gait Ambulation/Gait assistance: Mod assist;Min assist Ambulation Distance (Feet): 12 Feet Assistive device: Rolling walker (2 wheeled)       General Gait Details: Pt obviously  hesistant to take full weight through the L LE and leans heavily to the right despite much cuing and some assist.  Pt with heavy limp and fatigues quickly in R UE secondary to excessive reliance on walker during L stance phase.   Stairs            Wheelchair Mobility    Modified Rankin (Stroke Patients Only)       Balance Overall balance assessment: Needs assistance   Sitting balance-Leahy Scale: Fair       Standing balance-Leahy Scale: Poor                      Cognition Arousal/Alertness: Awake/alert Behavior During Therapy: WFL for tasks assessed/performed Overall Cognitive Status: Within Functional Limits for tasks assessed                      Exercises Total Joint Exercises Ankle Circles/Pumps: AROM;10 reps Quad Sets: 15 reps;Strengthening Gluteal Sets: Strengthening;15 reps Short Arc Quad: AROM;10 reps Heel Slides: AROM;10 reps Hip ABduction/ADduction: AAROM;AROM;10 reps    General Comments        Pertinent Vitals/Pain Pain Score:  (no pain at rest, significant increased with movement/WBing)    Home Living                      Prior Function            PT Goals (current goals can now be found in the care plan section) Progress towards PT goals: Progressing  toward goals    Frequency  BID    PT Plan Current plan remains appropriate    Co-evaluation             End of Session Equipment Utilized During Treatment: Gait belt Activity Tolerance: Patient limited by pain Patient left: with chair alarm set;with call bell/phone within reach;with family/visitor present     Time: 8412-8208 PT Time Calculation (min) (ACUTE ONLY): 29 min  Charges:  $Gait Training: 8-22 mins $Therapeutic Exercise: 8-22 mins                    G Codes:      Kreg Shropshire, DPT 05/04/2016, 11:56 AM

## 2016-05-04 NOTE — Care Management Important Message (Signed)
Important Message  Patient Details  Name: Jeffrey Haynes MRN: [id number] Date of Birth: [birth year]   Medicare Important Message Given:  Yes    Amaryllis Malmquist A, RN 05/04/2016, 12:09 PM

## 2016-05-05 LAB — BASIC METABOLIC PANEL
Anion gap: 4 — ABNORMAL LOW (ref 5–15)
BUN: 12 mg/dL (ref 6–20)
CHLORIDE: 105 mmol/L (ref 101–111)
CO2: 25 mmol/L (ref 22–32)
Calcium: 7.8 mg/dL — ABNORMAL LOW (ref 8.9–10.3)
Creatinine, Ser: 0.93 mg/dL (ref 0.61–1.24)
GFR calc non Af Amer: >60 mL/min (ref >60)
Glucose, Bld: 136 mg/dL — ABNORMAL HIGH (ref 65–99)
POTASSIUM: 4 mmol/L (ref 3.5–5.1)
SODIUM: 134 mmol/L — AB (ref 135–145)

## 2016-05-05 LAB — MRSA PCR SCREENING: MRSA by PCR: NEGATIVE

## 2016-05-05 LAB — HEMOGLOBIN: HEMOGLOBIN: 12.1 g/dL — AB (ref 13.0–18.0)

## 2016-05-05 MED ORDER — CHLORPROMAZINE HCL 10 MG PO TABS
10.0000 mg | ORAL_TABLET | Freq: Four times a day (QID) | ORAL | Status: DC | PRN
  Administered 2016-05-05: 10 mg via ORAL
  Filled 2016-05-05 (×3): qty 1

## 2016-05-05 MED ORDER — AMIODARONE HCL 200 MG PO TABS
400.0000 mg | ORAL_TABLET | Freq: Two times a day (BID) | ORAL | Status: DC
Start: 2016-05-05 — End: 2016-05-12
  Administered 2016-05-05 – 2016-05-12 (×14): 400 mg via ORAL
  Filled 2016-05-05 (×14): qty 2

## 2016-05-05 NOTE — Progress Notes (Addendum)
   Subjective: 3 Days Post-Op Procedure(s) (LRB): ARTHROPLASTY BIPOLAR HIP (HEMIARTHROPLASTY) (Left) Patient reports pain as mild.  Getting better. Patient is well, and has had no acute complaints or problems Continue with physical therapy today.  Plan is to go Rehab after hospital stay. no nausea and no vomiting Patient denies any chest pains or shortness of breath. Patient denies any h/o a-fib Patient transferred to ICU 2/2 tachycardia. Pt is in NSR now  Objective: Vital signs in last 24 hours: Temp:  [98.4 F (36.9 C)-99.5 F (37.5 C)] 98.8 F (37.1 C) (05/28 2302) Pulse Rate:  [75-152] 75 (05/29 0700) Resp:  [15-30] 24 (05/29 0700) BP: (95-118)/(48-69) 107/63 mmHg (05/29 0700) SpO2:  [82 %-95 %] 93 % (05/29 0700) Weight:  [90.7 kg (199 lb 15.3 oz)] 90.7 kg (199 lb 15.3 oz) (05/28 2302) well approximated incision Heels are non tender and elevated off the bed using rolled towels Intake/Output from previous day: 05/28 0701 - 05/29 0700 In: 1332.2 [P.O.:720; I.V.:612.2] Out: 200 [Urine:200] Intake/Output this shift:     Recent Labs  05/02/16 1543 05/03/16 0453 05/05/16 0322  HGB 14.1 12.3* 12.1*    Recent Labs  05/02/16 1543 05/03/16 0453  WBC 11.0* 9.6  RBC 4.40 3.79*  HCT 41.6 36.9*  PLT 186 161    Recent Labs  05/04/16 0509 05/05/16 0322  NA 134* 134*  K 4.2 4.0  CL 102 105  CO2 27 25  BUN 11 12  CREATININE 1.11 0.93  GLUCOSE 110* 136*  CALCIUM 8.1* 7.8*   No results for input(s): LABPT, INR in the last 72 hours.  EXAM General - Patient is Alert, Appropriate and Oriented Extremity - Neurologically intact Neurovascular intact Sensation intact distally Intact pulses distally Dorsiflexion/Plantar flexion intact Compartment soft Dressing - dressing C/D/I Motor Function - intact, moving foot and toes well on exam.    Past Medical History  Diagnosis Date  . COPD (chronic obstructive pulmonary disease) ([place])   . CAD (coronary artery  disease)   . Hyperlipidemia   . Familial tremor   . Bleeding ulcer     Assessment/Plan: 3 Days Post-Op Procedure(s) (LRB): ARTHROPLASTY BIPOLAR HIP (HEMIARTHROPLASTY) (Left) Active Problems:   Femur fracture ([facility])  Estimated body mass index is 29.52 kg/(m^2) as calculated from the following:   Height as of this encounter: '5\' 9"'$  (1.753 m).   Weight as of this encounter: 90.7 kg (199 lb 15.3 oz). Up with therapy Discharge to SNF  Labs: reviewed DVT Prophylaxis - Lovenox, Foot Pumps and TED hose Weight-Bearing as tolerated to left leg D/C O2 and Pulse OX and try on Room Air Patient needs to have a bowel movement today. Patient will need to follow-up with Dr. Roland Rack in 6 weeks. Staples  will need to be removed 2 weeks postop. Applied benzoin and half-inch Steri-Strips. Continue Lovenox 30 mg every 12 hours for 14 days then discontinue and begin taking one 81 mg enteric-coated aspirin per day unless contraindicated. WBAT to left leg Regular diet May discharge to rehab when medically stable E  Lenice Koper R. [place] 05/05/2016, 7:49 AM

## 2016-05-05 NOTE — Progress Notes (Signed)
[facility] NAME: Jeffrey Haynes    MR#:  [id number]  DATE OF BIRTH:  August 07, 1925  SUBJECTIVE:  CHIEF COMPLAINT:   Chief Complaint  Patient presents with  . Fall  Patient is 82 year old Caucasian male with medical history significant for the history of COPD, coronary artery disease, hyperlipidemia, familial tremor, peptic ulcer disease who presents to the hospital with complaints of fall after he slipped on the tile floor. Patient was brought to emergency room in he was found to have left femur fracture. He underwent left hemiarthroplasty on 05/03/2016, Some complains of left hip pain, but overall feels comfortable. The patient was noted to have acute COPD exacerbation yesterday, initially to turn doorknobs, developed A. fib RVR and was transported to ICU for amiodarone infusion. Now he converted back to sinus rhythm, heart rate in 80s. He feels comfortable. Troponin is normal.     Review of Systems  Constitutional: Negative for fever, chills and weight loss.  HENT: Negative for congestion.   Eyes: Negative for blurred vision and double vision.  Respiratory: Negative for cough, sputum production, shortness of breath and wheezing.   Cardiovascular: Negative for chest pain, palpitations, orthopnea, leg swelling and PND.  Gastrointestinal: Negative for nausea, vomiting, abdominal pain, diarrhea, constipation and blood in stool.  Genitourinary: Negative for dysuria, urgency, frequency and hematuria.  Musculoskeletal: Positive for joint pain. Negative for falls.  Neurological: Negative for dizziness, tremors, focal weakness and headaches.  Endo/Heme/Allergies: Does not bruise/bleed easily.  Psychiatric/Behavioral: Negative for depression. The patient does not have insomnia.     VITAL SIGNS: Blood pressure 122/66, pulse 91, temperature 98.3 F (36.8 C), temperature source Oral, resp. rate 22, height '5\' 9"'$  (1.753 m), weight 90.7 kg (199  lb 15.3 oz), SpO2 92 %.  PHYSICAL EXAMINATION:   GENERAL:  82 y.o.-year-old patient lying in the bed with no acute distress.  EYES: Pupils equal, round, reactive to light and accommodation. No scleral icterus. Extraocular muscles intact.  HEENT: Head atraumatic, normocephalic. Oropharynx and nasopharynx clear.  NECK:  Supple, no jugular venous distention. No thyroid enlargement, no tenderness.  LUNGS: some mild bilateral diminished breath sounds , no wheezing, bilateral scattered rales,rhonchi , but no crepitation. No use of accessory muscles of respiration.  CARDIOVASCULAR: S1, S2 is regular. No murmurs, rubs, or gallops.  ABDOMEN: Soft, tender in left side of the abdomen , however, inconsistent on palpation, no rebound or guarding nondistended. Bowel sounds present. No organomegaly or mass.  EXTREMITIES: No pedal edema, cyanosis, or clubbing. Left hip dressing is intact. No bleeding, swelling or pain on palpation NEUROLOGIC: Cranial nerves II through XII are intact. Muscle strength 5/5 in all extremities. Sensation intact. Gait not checked.  PSYCHIATRIC: The patient is alert and oriented x 3.  SKIN: No obvious rash, lesion, or ulcer.   ORDERS/RESULTS REVIEWED:   CBC  Recent Labs Lab 05/02/16 1543 05/03/16 0453 05/05/16 0322  WBC 11.0* 9.6  --   HGB 14.1 12.3* 12.1*  HCT 41.6 36.9*  --   PLT 186 161  --   MCV 94.4 97.3  --   MCH 32.0 32.5  --   MCHC 34.0 33.4  --   RDW 14.1 13.5  --   LYMPHSABS 1.8 1.2  --   MONOABS 0.8 0.7  --   EOSABS 0.1 0.1  --   BASOSABS 0.0 0.0  --    ------------------------------------------------------------------------------------------------------------------  Chemistries   Recent Labs Lab 05/02/16 1543 05/03/16 0453 05/04/16  2035 05/05/16 0322  NA 137 138 134* 134*  K 5.2* 4.1 4.2 4.0  CL 106 109 102 105  CO2 '24 25 27 25  '$ GLUCOSE 98 103* 110* 136*  BUN '15 13 11 12  '$ CREATININE 0.95 0.91 1.11 0.93  CALCIUM 9.0 7.9* 8.1* 7.8*  AST 25   --   --   --   ALT 11*  --   --   --   ALKPHOS 80  --   --   --   BILITOT 1.5*  --   --   --    ------------------------------------------------------------------------------------------------------------------ estimated creatinine clearance is 61.1 mL/min (by C-G formula based on Cr of 0.93). ------------------------------------------------------------------------------------------------------------------ No results for input(s): TSH, T4TOTAL, T3FREE, THYROIDAB in the last 72 hours.  Invalid input(s): FREET3  Cardiac Enzymes  Recent Labs Lab 05/04/16 2055  TROPONINI <0.03   ------------------------------------------------------------------------------------------------------------------ Invalid input(s): POCBNP ---------------------------------------------------------------------------------------------------------------  RADIOLOGY: Ct Angio Chest Pe W/cm &/or Wo Cm  05/05/2016  CLINICAL DATA:  Acute onset of tachycardia and shortness of breath, status post left hip surgery. Initial encounter. EXAM: CT ANGIOGRAPHY CHEST WITH CONTRAST TECHNIQUE: Multidetector CT imaging of the chest was performed using the standard protocol during bolus administration of intravenous contrast. Multiplanar CT image reconstructions and MIPs were obtained to evaluate the vascular anatomy. CONTRAST:  75 mL of Isovue 370 IV contrast COMPARISON:  Chest radiograph performed 05/02/2016 FINDINGS: There is no evidence of pulmonary embolus. Trace bilateral pleural fluid is noted. Interstitial prominence is seen, with patchy peripheral opacities, concerning for mild pulmonary edema. Underlying mild fibrotic change is a concern. There is no evidence of pneumothorax. No masses are identified; no abnormal focal contrast enhancement is seen. Diffuse coronary artery calcifications are seen. Scattered right hilar and azygoesophageal recess nodes measure up to 1.3 cm in short axis. No pericardial effusion is seen. Scattered  calcification is noted along the proximal great vessels. No axillary lymphadenopathy is seen. The visualized portions of the thyroid gland are unremarkable in appearance. The visualized portions of the liver and spleen are unremarkable. Mild wall thickening is noted along the distal esophagus, which could reflect mild esophagitis or chronic inflammation, depending on the patient's symptoms. No acute osseous abnormalities are seen. Mild degenerative change is noted at the lower cervical spine. Review of the MIP images confirms the above findings. IMPRESSION: 1. No evidence of pulmonary embolus. 2. Trace bilateral pleural fluid. Interstitial prominence, with patchy peripheral opacities, concerning for mild pulmonary edema. 3. Underlying mild fibrotic change noted. 4. Diffuse coronary artery calcifications seen. 5. Scattered right hilar and mediastinal nodes measure up to 1.3 cm in short axis. 6. Mild wall thickening along the distal esophagus, which could reflect mild esophagitis or chronic inflammation, depending on the patient's symptoms. Would correlate clinically. Electronically Signed   By: Garald Balding M.D.   On: 05/05/2016 00:25    EKG:  Orders placed or performed during the hospital encounter of 05/02/16  . ED EKG  . ED EKG  . EKG 12-Lead  . EKG 12-Lead  . EKG 12-Lead  . EKG 12-Lead    ASSESSMENT AND PLAN:  Active Problems:   Femur fracture ([place]) #1. Mildly displaced left femoral neck fracture, status post left hip arthroplasty on 05/03/2016 by Dr. Roland Rack, continue pain control, adequate, continue anticoagulation with Lovenox at 40 mg subcutaneously once a day, patient Will need to go to skilled nursing facility for rehabilitation, per physical therapist recommendations #2. Acute posthemorrhagic anemia, about 2 g hemoglobin drop after surgery, Stable today, no  need to transfuse  #3. Leukocytosis, likely reactive, resolved #4. History of coronary artery disease, stable, continue outpatient  medications #5. Hyperkalemia, resolved #6. Nausea, resolved, advance diet as tolerated #7. Constipation, advance oral medications, enema as needed  #8 . COPD with exacerbation, patient was initiated on DuoNeb's, budesonide , improved #9 A. fib RVR, converted to sinus rhythm on amiodarone intravenously, initiate patient on amiodarone orally, discontinue intravenous amiodarone, get echocardiogram  Management plans discussed with the patient, family and they are in agreement.   DRUG ALLERGIES: No Known Allergies  CODE STATUS:     Code Status Orders        Start     Ordered   05/03/16 0015  Full code   Continuous     05/03/16 0014    Code Status History    Date Active Date Inactive Code Status Order ID Comments User Context   This patient has a current code status but no historical code status.    Advance Directive Documentation        Most Recent Value   Type of Advance Directive  Healthcare Power of Attorney   Pre-existing out of facility DNR order (yellow form or pink MOST form)     "MOST" Form in Place?        TOTAL TIME TAKING CARE OF THIS PATIENT: 40 minutes.   Discussed this patient's son , all questions were answered Jeffrey Haynes M.D on 05/05/2016 at 3:31 PM  Between 7am to 6pm - Pager - [phone]  After 6pm go to [email or web address] - password [email or web address] [facility]  Office  [phone]  CC: Primary care physician; Golden Pop, MD

## 2016-05-05 NOTE — Progress Notes (Signed)
Report called to Adonis Huguenin, RN on 1A.  Patient has been A&Ox4.  Denies pain at this time. NSR per cardiac monitor.  Taking PO amio.  VSS.  Son at bedside during most of shift.  Patient being transferred to 1A.

## 2016-05-05 NOTE — Progress Notes (Signed)
   05/05/16 1415  PT Visit Information  Reason Eval/Treat Not Completed Other (comment) (Patient was transfered to IC unit and will need new orders to resume therapy)

## 2016-05-05 NOTE — Progress Notes (Signed)
Patient transferred to room 148 by bed on ICU monitor with Raiford Noble, RN and [place], [place].

## 2016-05-06 ENCOUNTER — Inpatient Hospital Stay: Payer: Medicare Other

## 2016-05-06 ENCOUNTER — Inpatient Hospital Stay (HOSPITAL_COMMUNITY)
Admit: 2016-05-06 | Discharge: 2016-05-06 | Disposition: A | Discharge status: Home / Self Care | Payer: Medicare Other | Attending: Internal Medicine | Admitting: Internal Medicine

## 2016-05-06 ENCOUNTER — Encounter: Payer: Self-pay | Admitting: Surgery

## 2016-05-06 DIAGNOSIS — S7292XP Unspecified fracture of left femur, subsequent encounter for closed fracture with malunion: Secondary | ICD-10-CM

## 2016-05-06 DIAGNOSIS — R0603 Acute respiratory distress: Secondary | ICD-10-CM | POA: Diagnosis not present

## 2016-05-06 DIAGNOSIS — J42 Unspecified chronic bronchitis: Secondary | ICD-10-CM

## 2016-05-06 DIAGNOSIS — Z96649 Presence of unspecified artificial hip joint: Secondary | ICD-10-CM | POA: Insufficient documentation

## 2016-05-06 DIAGNOSIS — W19XXXA Unspecified fall, initial encounter: Secondary | ICD-10-CM | POA: Insufficient documentation

## 2016-05-06 DIAGNOSIS — I4891 Unspecified atrial fibrillation: Secondary | ICD-10-CM | POA: Diagnosis not present

## 2016-05-06 DIAGNOSIS — I509 Heart failure, unspecified: Secondary | ICD-10-CM

## 2016-05-06 DIAGNOSIS — Z9660 Presence of unspecified orthopedic joint implant: Secondary | ICD-10-CM

## 2016-05-06 LAB — ECHOCARDIOGRAM COMPLETE
HEIGHTINCHES: 69 in
Weight: 3199.32 oz

## 2016-05-06 LAB — SODIUM: Sodium: 132 mmol/L — ABNORMAL LOW (ref 135–145)

## 2016-05-06 LAB — TSH: TSH: 1.61 u[IU]/mL (ref 0.350–4.500)

## 2016-05-06 LAB — GLUCOSE, CAPILLARY: Glucose-Capillary: 154 mg/dL — ABNORMAL HIGH (ref 65–99)

## 2016-05-06 MED ORDER — METOCLOPRAMIDE HCL 5 MG/ML IJ SOLN
10.0000 mg | Freq: Four times a day (QID) | INTRAMUSCULAR | Status: DC
  Administered 2016-05-06 – 2016-05-13 (×25): 10 mg via INTRAVENOUS
  Filled 2016-05-06 (×26): qty 2

## 2016-05-06 MED ORDER — METOPROLOL SUCCINATE ER 25 MG PO TB24
25.0000 mg | ORAL_TABLET | Freq: Two times a day (BID) | ORAL | Status: DC
  Administered 2016-05-06 – 2016-05-07 (×2): 25 mg via ORAL
  Filled 2016-05-06 (×2): qty 1

## 2016-05-06 MED ORDER — SODIUM CHLORIDE 0.9 % IV SOLN
INTRAVENOUS | Status: DC
  Administered 2016-05-06 – 2016-05-11 (×12): via INTRAVENOUS

## 2016-05-06 NOTE — Progress Notes (Signed)
Pt complained of nausea and vomiting, Zofran IV administered. Requested enema as well for abdominal discomfort. Fleets enema administered as ordered, will follow up.

## 2016-05-06 NOTE — Progress Notes (Signed)
[facility] [place] NAME: Jeffrey Haynes    MR#:  [id number]  DATE OF BIRTH:  [birth year]  SUBJECTIVE:  CHIEF COMPLAINT:   Chief Complaint  Patient presents with  . Fall  Patient is 82 year old Caucasian male with medical history significant for the history of COPD, coronary artery disease, hyperlipidemia, familial tremor, peptic ulcer disease who presents to the hospital with complaints of fall after he slipped on the tile floor. Patient was brought to emergency room in he was found to have left femur fracture. He underwent left hemiarthroplasty on 05/03/2016, Some complains of left hip pain, but overall feels comfortable. The patient was noted to have acute COPD exacerbation ,  developed A. fib RVR and was transported to ICU for amiodarone infusion. He converted back to sinus rhythm, heart rate in 80s.  Patient feels poorly today, complains of abdominal distention, no bowel movements for a few days. He is intermittently regurgitating stomach contents into his throat, mouth and swallowing it back again, denies significant abdominal pain    Review of Systems  Constitutional: Negative for fever, chills and weight loss.  HENT: Negative for congestion.   Eyes: Negative for blurred vision and double vision.  Respiratory: Negative for cough, sputum production, shortness of breath and wheezing.   Cardiovascular: Negative for chest pain, palpitations, orthopnea, leg swelling and PND.  Gastrointestinal: Negative for nausea, vomiting, abdominal pain, diarrhea, constipation and blood in stool.  Genitourinary: Negative for dysuria, urgency, frequency and hematuria.  Musculoskeletal: Positive for joint pain. Negative for falls.  Neurological: Negative for dizziness, tremors, focal weakness and headaches.  Endo/Heme/Allergies: Does not bruise/bleed easily.  Psychiatric/Behavioral: Negative for depression. The patient does not have insomnia.     VITAL  SIGNS: Blood pressure 111/58, pulse 95, temperature 98.1 F (36.7 C), temperature source Oral, resp. rate 18, height '5\' 9"'$  (1.753 m), weight 90.7 kg (199 lb 15.3 oz), SpO2 94 %.  PHYSICAL EXAMINATION:   GENERAL:  82 y.o.-year-old patient lying in the bed with no acute distress.  EYES: Pupils equal, round, reactive to light and accommodation. No scleral icterus. Extraocular muscles intact.  HEENT: Head atraumatic, normocephalic. Oropharynx and nasopharynx clear.  NECK:  Supple, no jugular venous distention. No thyroid enlargement, no tenderness.  LUNGS: Better air entrance bilaterally , no wheezing, few bilateral scattered rales,rhonchi , but no crepitation. Intermittent use of accessory muscles of respiration, especially moving around.  CARDIOVASCULAR: S1, S2 is regular. No murmurs, rubs, or gallops.  ABDOMEN: Moderately firm, no significant tenderness, no rebound or guarding nondistended, distended. Bowel sounds present. No organomegaly or mass.  EXTREMITIES: No pedal edema, cyanosis, or clubbing. Left hip dressing is intact. No bleeding, swelling or pain on palpation NEUROLOGIC: Cranial nerves II through XII are intact. Muscle strength 5/5 in all extremities. Sensation intact. Gait not checked.  PSYCHIATRIC: The patient is alert and oriented x 3.  SKIN: No obvious rash, lesion, or ulcer.   ORDERS/RESULTS REVIEWED:   CBC  Recent Labs Lab 05/02/16 1543 05/03/16 0453 05/05/16 0322  WBC 11.0* 9.6  --   HGB 14.1 12.3* 12.1*  HCT 41.6 36.9*  --   PLT 186 161  --   MCV 94.4 97.3  --   MCH 32.0 32.5  --   MCHC 34.0 33.4  --   RDW 14.1 13.5  --   LYMPHSABS 1.8 1.2  --   MONOABS 0.8 0.7  --   EOSABS 0.1 0.1  --   BASOSABS 0.0  0.0  --    ------------------------------------------------------------------------------------------------------------------  Chemistries   Recent Labs Lab 05/02/16 1543 05/03/16 0453 05/04/16 0509 05/05/16 0322 05/06/16 0349  NA 137 138 134* 134* 132*   K 5.2* 4.1 4.2 4.0  --   CL 106 109 102 105  --   CO2 '24 25 27 25  '$ --   GLUCOSE 98 103* 110* 136*  --   BUN '15 13 11 12  '$ --   CREATININE 0.95 0.91 1.11 0.93  --   CALCIUM 9.0 7.9* 8.1* 7.8*  --   AST 25  --   --   --   --   ALT 11*  --   --   --   --   ALKPHOS 80  --   --   --   --   BILITOT 1.5*  --   --   --   --    ------------------------------------------------------------------------------------------------------------------ estimated creatinine clearance is 61.1 mL/min (by C-G formula based on Cr of 0.93). ------------------------------------------------------------------------------------------------------------------  Recent Labs  05/06/16 0349  TSH 1.610    Cardiac Enzymes  Recent Labs Lab 05/04/16 2055  TROPONINI <0.03   ------------------------------------------------------------------------------------------------------------------ Invalid input(s): POCBNP ---------------------------------------------------------------------------------------------------------------  RADIOLOGY: Dg Abd 1 View  05/06/2016  CLINICAL DATA:  Abdominal pain. Status post left hip arthroplasty for left femoral neck fracture. EXAM: ABDOMEN - 1 VIEW COMPARISON:  Pelvic film on 05/02/2016. CT of the abdomen and pelvis on 10/02/2012 FINDINGS: There is increasing gaseous distention of the colon consistent with postoperative ileus. No gross signs of free air. Calcified gallstones visualized. Bony structures show spondylosis of the lumbar spine. IMPRESSION: 1. Gaseous distention of the colon consistent with postoperative ileus. 2. Calcified gallstones. Electronically Signed   By: Aletta Edouard M.D.   On: 05/06/2016 10:19   Ct Angio Chest Pe W/cm &/or Wo Cm  05/05/2016  CLINICAL DATA:  Acute onset of tachycardia and shortness of breath, status post left hip surgery. Initial encounter. EXAM: CT ANGIOGRAPHY CHEST WITH CONTRAST TECHNIQUE: Multidetector CT imaging of the chest was performed using  the standard protocol during bolus administration of intravenous contrast. Multiplanar CT image reconstructions and MIPs were obtained to evaluate the vascular anatomy. CONTRAST:  75 mL of Isovue 370 IV contrast COMPARISON:  Chest radiograph performed 05/02/2016 FINDINGS: There is no evidence of pulmonary embolus. Trace bilateral pleural fluid is noted. Interstitial prominence is seen, with patchy peripheral opacities, concerning for mild pulmonary edema. Underlying mild fibrotic change is a concern. There is no evidence of pneumothorax. No masses are identified; no abnormal focal contrast enhancement is seen. Diffuse coronary artery calcifications are seen. Scattered right hilar and azygoesophageal recess nodes measure up to 1.3 cm in short axis. No pericardial effusion is seen. Scattered calcification is noted along the proximal great vessels. No axillary lymphadenopathy is seen. The visualized portions of the thyroid gland are unremarkable in appearance. The visualized portions of the liver and spleen are unremarkable. Mild wall thickening is noted along the distal esophagus, which could reflect mild esophagitis or chronic inflammation, depending on the patient's symptoms. No acute osseous abnormalities are seen. Mild degenerative change is noted at the lower cervical spine. Review of the MIP images confirms the above findings. IMPRESSION: 1. No evidence of pulmonary embolus. 2. Trace bilateral pleural fluid. Interstitial prominence, with patchy peripheral opacities, concerning for mild pulmonary edema. 3. Underlying mild fibrotic change noted. 4. Diffuse coronary artery calcifications seen. 5. Scattered right hilar and mediastinal nodes measure up to 1.3 cm in short axis. 6. Mild  wall thickening along the distal esophagus, which could reflect mild esophagitis or chronic inflammation, depending on the patient's symptoms. Would correlate clinically. Electronically Signed   By: Garald Balding M.D.   On: 05/05/2016  00:25    EKG:  Orders placed or performed during the hospital encounter of 05/02/16  . ED EKG  . ED EKG  . EKG 12-Lead  . EKG 12-Lead  . EKG 12-Lead  . EKG 12-Lead    ASSESSMENT AND PLAN:  Principal Problem:   Femur fracture ([facility]) Active Problems:   COPD (chronic obstructive pulmonary disease) ([place])   New onset atrial fibrillation ([facility])   Respiratory distress   Fall   Status post hip hemiarthroplasty #1. Mildly displaced left femoral neck fracture, status post left hip arthroplasty on 05/03/2016 by Dr. Roland Rack, continue pain control, adequate, continue anticoagulation with Lovenox at 40 mg subcutaneously once a day, patient Will need to go to skilled nursing facility for rehabilitation, per physical therapist recommendations #2. Acute posthemorrhagic anemia, about 2 g hemoglobin drop after surgery, Stable today, no need to transfuse  #3. Leukocytosis, likely reactive, resolved #4. History of coronary artery disease, stable, continue outpatient medications #5. Hyperkalemia, resolved #6. Ileus, postoperative, so patient nothing by mouth, IV fluids, Reglan, supportive therapy, place NG tube if nausea and vomiting, follow for possible aspirations #7. Constipation,  enema today with minimal output  #8 . COPD with exacerbation, likely due to aspirations, continue  DuoNeb's, budesonide , improved #9 A. fib RVR, converted to sinus rhythm on amiodarone intravenously, continue patient on amiodarone orally, , cardiology consultation is appreciated, echocardiogram was unremarkable  Management plans discussed with the patient, family and they are in agreement.   DRUG ALLERGIES: No Known Allergies  CODE STATUS:     Code Status Orders        Start     Ordered   05/03/16 0015  Full code   Continuous     05/03/16 0014    Code Status History    Date Active Date Inactive Code Status Order ID Comments User Context   This patient has a current code status but no historical code status.     Advance Directive Documentation        Most Recent Value   Type of Advance Directive  Healthcare Power of Attorney   Pre-existing out of facility DNR order (yellow form or pink MOST form)     "MOST" Form in Place?        TOTAL TIME TAKING CARE OF THIS PATIENT: 40 minutes.  Discussed this patient's son , all questions were answered   Lylianna Fraiser M.D on 05/06/2016 at 4:23 PM  Between 7am to 6pm - Pager - [phone]  After 6pm go to [email or web address] - password [email or web address] [facility]  Office  [phone]  CC: Primary care physician; Golden Pop, MD

## 2016-05-06 NOTE — Progress Notes (Addendum)
Patient without complaints of nausea or vomiting. Incontinent care provided. New order fluid rate change to 17m/hr. NPO, mouth moisturizer provided. Abd distention continues to be uncomfortable, no order for enema at this time.

## 2016-05-06 NOTE — Progress Notes (Signed)
Spoke with Dr. Juliet Rude about post op iluis pt is npo and not able to give any bowel medication. No new orders at this time. MD just wants to continue to monitor.

## 2016-05-06 NOTE — Progress Notes (Signed)
Pt was able to have a small BM this AM, had gas and reported he feels better. Continue to monitor.

## 2016-05-06 NOTE — Progress Notes (Signed)
Physical Therapy Treatment Patient Details Name: Jeffrey Haynes MRN: [id number] DOB: March 16, 1925 Today's Date: 05/06/2016    History of Present Illness Normally active 82 y/o male who suffered a fall with L hip fx, needing replacement (posterior approach)    PT Comments    Second attempt for treatment, pt returned from x ray; pt lethargic, but able to awaken with encouragement during several exercises for active participation. O2 saturation initially 89% on 2 liters, but improved with elevated head of bed and pursed lip breathing. Pt participates with supine exercises pain free requiring active assisted range of motion on left. Respiratory therapist provided breathing treatment during session. Pt assisted to edge of bed sitting for treatment for improved breathing. Pt requires heavy Mod assist to sit and Mod A to maintain sitting balance. Pt notes increased pain in left hip (10) with sitting; poor posture with posterolateral lean to the right. Pt has a difficult time maintaining either foot on the floor to assist with balance. Pt returned to supine after 5 minutes sitting with Max assist and Max A x 2 to readjust upward in bed. Heart rate throughout session via monitor 104-105 beats per minute. Pt fatigued. Plan to see pt this afternoon to continue progress on range of motion, strength, sitting balance and potential stand/ambulation as tolerated.   Follow Up Recommendations  SNF     Equipment Recommendations  Rolling walker with 5" wheels    Recommendations for Other Services       Precautions / Restrictions Precautions Precautions: Fall;Posterior Hip Restrictions Weight Bearing Restrictions: Yes    Mobility  Bed Mobility Overal bed mobility: Needs Assistance Bed Mobility: Supine to Sit;Sit to Supine     Supine to sit: Mod assist;HOB elevated Sit to supine: Max assist   General bed mobility comments: Assist for scooting hips to edge of bed in supine as well as LEs off edge of bed  and trunk elevation. Difficulty maintaining sitting edge of bed requring Mod A  Transfers                 General transfer comment: Not attempted due to poor sitting balance and difficulty maintaining feet on the floor  Ambulation/Gait                 Stairs            Wheelchair Mobility    Modified Rankin (Stroke Patients Only)       Balance Overall balance assessment: Needs assistance Sitting-balance support: Bilateral upper extremity supported;Feet supported;Feet unsupported Sitting balance-Leahy Scale: Poor Sitting balance - Comments: Pt requires Mod A; difficulty following instructions for keeping feet on the floor to aid in balance. Poor postural control in part due to L hip pain.  Postural control: Posterior lean;Right lateral lean                          Cognition Arousal/Alertness: Lethargic (able to awaken for active participation) Behavior During Therapy: [facility] for tasks assessed/performed Overall Cognitive Status: Within Functional Limits for tasks assessed                      Exercises Total Joint Exercises Ankle Circles/Pumps: AROM;Both;20 reps;Supine Quad Sets: Strengthening;Both;10 reps;Supine Gluteal Sets: Strengthening;Both;10 reps;Supine Towel Squeeze: Strengthening;Both;10 reps;Supine Short Arc Quad: AAROM;10 reps;Supine;Both (R AROM) Heel Slides: AAROM;20 reps;Supine;Both (R AROM) Hip ABduction/ADduction: AAROM;20 reps;Supine;Both (R AROM) Straight Leg Raises: AAROM;10 reps;Supine;Both Other Exercises Other Exercises: sitting balance edge of bed  5 minutes during breathing treatment    General Comments        Pertinent Vitals/Pain Pain Assessment: 0-10 Pain Score: 10-Worst pain ever (with sitting edge of bed; otherwise denies) Pain Location: L hip Pain Descriptors / Indicators: Sharp Pain Intervention(s): Limited activity within patient's tolerance;Monitored during session;Premedicated before session     Home Living                      Prior Function            PT Goals (current goals can now be found in the care plan section) Progress towards PT goals: Progressing toward goals (slowly)    Frequency  BID    PT Plan Current plan remains appropriate    Co-evaluation             End of Session Equipment Utilized During Treatment: Oxygen Activity Tolerance: Patient limited by fatigue;Patient limited by pain Patient left: in bed;with call bell/phone within reach;with bed alarm set;with SCD's reapplied     Time: 1610-9604 PT Time Calculation (min) (ACUTE ONLY): 38 min  Charges:  $Therapeutic Exercise: 23-37 mins $Therapeutic Activity: 8-22 mins                    G Codes:      Charlaine Dalton, PTA 05/06/2016, 11:51 AM

## 2016-05-06 NOTE — Progress Notes (Signed)
   Subjective: 4 Days Post-Op Procedure(s) (LRB): ARTHROPLASTY BIPOLAR HIP (HEMIARTHROPLASTY) (Left) Patient reports pain as moderate.   Patient is well, and has had no acute complaints or problems. COPD exacerbation and Afib yesterday, taken to ICU. Denies any CP or ABD pain. Mild SOB today, but improved from yesterday. We will continue therapy today.  Plan is to go Rehab after hospital stay.  Objective: Vital signs in last 24 hours: Temp:  [98 F (36.7 C)-99.7 F (37.6 C)] 98.1 F (36.7 C) (05/30 0449) Pulse Rate:  [75-99] 99 (05/30 0449) Resp:  [13-24] 20 (05/30 0449) BP: (101-152)/(49-96) 152/70 mmHg (05/30 0449) SpO2:  [90 %-99 %] 91 % (05/30 0449)  Intake/Output from previous day: 05/29 0701 - 05/30 0700 In: 356.9 [P.O.:240; I.V.:116.9] Out: -  Intake/Output this shift:     Recent Labs  05/05/16 0322  HGB 12.1*   No results for input(s): WBC, RBC, HCT, PLT in the last 72 hours.  Recent Labs  05/04/16 0509 05/05/16 0322 05/06/16 0349  NA 134* 134* 132*  K 4.2 4.0  --   CL 102 105  --   CO2 27 25  --   BUN 11 12  --   CREATININE 1.11 0.93  --   GLUCOSE 110* 136*  --   CALCIUM 8.1* 7.8*  --    No results for input(s): LABPT, INR in the last 72 hours.  EXAM General - Patient is Alert, Appropriate and Oriented Extremity - Neurovascular intact Sensation intact distally Intact pulses distally Dorsiflexion/Plantar flexion intact Dressing - dressing C/D/I and moderate drainage Motor Function - intact, moving foot and toes well on exam.   Past Medical History  Diagnosis Date  . COPD (chronic obstructive pulmonary disease) ([place])   . CAD (coronary artery disease)   . Hyperlipidemia   . Familial tremor   . Bleeding ulcer     Assessment/Plan:   4 Days Post-Op Procedure(s) (LRB): ARTHROPLASTY BIPOLAR HIP (HEMIARTHROPLASTY) (Left) Active Problems:   Femur fracture (HCC)  Estimated body mass index is 29.52 kg/(m^2) as calculated from the following:  Height as of this encounter: '5\' 9"'$  (1.753 m).   Weight as of this encounter: 90.7 kg (199 lb 15.3 oz). Advance diet Up with therapy  Plan on discharge to SNF once medically cleared Remove staples and apply steri strips 04/15/16. Follow up with [place] orth[facility] in 6 weeks.  DVT Prophylaxis - Lovenox, Foot Pumps and TED hose Weight-Bearing as tolerated to left leg D/C O2 and Pulse OX and try on Room Air  T. Rachelle Hora, PA-C [place] 05/06/2016, 7:22 AM

## 2016-05-06 NOTE — Progress Notes (Signed)
Complained of hip pain and stomach pain. MOM and Miralax administered for constipation. Will try enema if ineffective.

## 2016-05-06 NOTE — Discharge Instructions (Signed)
HIP FRACTURE POSTOPERATIVE DIRECTIONS  Hip Rehabilitation, Guidelines Following Surgery  The results of a hip operation are greatly improved after range of motion and muscle strengthening exercises. Follow all safety measures which are given to protect your hip. If any of these exercises cause increased pain or swelling in your joint, decrease the amount until you are comfortable again. Then slowly increase the exercises. Call your caregiver if you have problems or questions.   HOME CARE INSTRUCTIONS  Remove items at home which could result in a fall. This includes throw rugs or furniture in walking pathways.   ICE to the affected hip every three hours for 30 minutes at a time and then as needed for pain and swelling.  Continue to use ice on the hip for pain and swelling from surgery. You may notice swelling that will progress down to the foot and ankle.  This is normal after surgery.  Elevate the leg when you are not up walking on it.    Continue to use the breathing machine which will help keep your temperature down.  It is common for your temperature to cycle up and down following surgery, especially at night when you are not up moving around and exerting yourself.  The breathing machine keeps your lungs expanded and your temperature down.  DIET You may resume your previous home diet once your are discharged from the hospital. If you go to a rehab facility after surgery you will need to be on the diet appropriate for your medical history.   DRESSING / WOUND  CARE / SHOWERING  Remove staples and apply steri strips on 04/15/16  DO NOT GET THE INCISION WET You may start showering once staples have been removed at 2 weeks. Change dressing as needed. Do not submerge the incision in water such as a bath tub, swimming pool or hot tubs until the incision is completely healed which is approximately 4 weeks.   ACTIVITY Walk with your walker as instructed. Use walker as long as suggested by your  caregivers.May go to using a cane once your therapist feels that it is safe to do so. Avoid periods of inactivity such as sitting longer than an hour when not asleep. This helps prevent blood clots.  You may resume a sexual relationship in one month or when given the OK by your doctor.  You may return to work once you are cleared by your doctor.  Do not drive a car for 6 weeks or until released by you surgeon.  Do not drive while taking narcotics.   WEIGHT BEARING You may wait bear as tolerated on the surgical leg.  POSTOPERATIVE CONSTIPATION PROTOCOL Constipation - defined medically as fewer than three stools per week and severe constipation as less than one stool per week.  One of the most common issues patients have following surgery is constipation.  Even if you have a regular bowel pattern at home, your normal regimen is likely to be disrupted due to multiple reasons following surgery.  Combination of anesthesia, postoperative narcotics, change in appetite and fluid intake all can affect your bowels.  In order to avoid complications following surgery, here are some recommendations in order to help you during your recovery period.  Colace (docusate) - Pick up an over-the-counter form of Colace or another stool softener and take twice a day as long as you are requiring postoperative pain medications.  Take with a full glass of water daily.  If you experience loose stools or diarrhea, hold  the colace until you stool forms back up.  If your symptoms do not get better within 1 week or if they get worse, check with your doctor.  Dulcolax (bisacodyl) - Pick up over-the-counter and take as directed by the product packaging as needed to assist with the movement of your bowels.  Take with a full glass of water.  Use this product as needed if not relieved by Colace only.   MiraLax (polyethylene glycol) - Pick up over-the-counter to have on hand.  MiraLax is a solution that will increase the amount of  water in your bowels to assist with bowel movements.  Take as directed and can mix with a glass of water, juice, soda, coffee, or tea.  Take if you go more than two days without a movement. Do not use MiraLax more than once per day. Call your doctor if you are still constipated or irregular after using this medication for 7 days in a row.  If you continue to have problems with postoperative constipation, please contact the office for further assistance and recommendations.  If you experience "the worst abdominal pain ever" or develop nausea or vomiting, please contact the office immediatly for further recommendations for treatment.  ITCHING  If you experience itching with your medications, try taking only a single pain pill, or even half a pain pill at a time.  You can also use Benadryl over the counter for itching or also to help with sleep.   TED HOSE STOCKINGS Wear the elastic stockings on both legs. If you go home, you may remove these at night but will need to put them on the first thing in the morning. If you go to rehab, then you may remove them one hour per 8 hour shift. This is because in rehab you are not as active as you are at home.  MEDICATIONS See your medication summary on the After Visit Summary that the nursing staff will review with you prior to discharge.  You may have some home medications which will be placed on hold until you complete the course of blood thinner medication.  It is important for you to complete the blood thinner medication as prescribed by your surgeon.  Continue your approved medications as instructed at time of discharge.  PRECAUTIONS If you experience chest pain or shortness of breath - call 911 immediately for transfer to the hospital emergency department.  If you develop a fever greater that 101 F, purulent drainage from wound, increased redness or drainage from wound, foul odor from the wound/dressing, or calf pain - CONTACT YOUR SURGEON.                                                    FOLLOW-UP APPOINTMENTS Make sure you keep all of your appointments after your operation with your surgeon and caregivers. You should call the office at the above phone number and make an appointment for approximately 6 weeks after the date of your surgery or on the date instructed by your surgeon outlined in the "After Visit Summary".  Sooner if there is complication.  RANGE OF MOTION AND STRENGTHENING EXERCISES  These exercises are designed to help you keep full movement of your hip joint. Follow your caregiver's or physical therapist's instructions. Perform all exercises about fifteen times, three times per day or as directed. Exercise both  hips, even if you have had only one joint replacement. These exercises can be done on a training (exercise) mat, on the floor, on a table or on a bed. Use whatever works the best and is most comfortable for you. Use music or television while you are exercising so that the exercises are a pleasant break in your day. This will make your life better with the exercises acting as a break in routine you can look forward to.  Lying on your back, slowly slide your foot toward your buttocks, raising your knee up off the floor. Then slowly slide your foot back down until your leg is straight again.  Lying on your back spread your legs as far apart as you can without causing discomfort.  Lying on your side, raise your upper leg and foot straight up from the floor as far as is comfortable. Slowly lower the leg and repeat.  Lying on your back, tighten up the muscle in the front of your thigh (quadriceps muscles). You can do this by keeping your leg straight and trying to raise your heel off the floor. This helps strengthen the largest muscle supporting your knee.  Lying on your back, tighten up the muscles of your buttocks both with the legs straight and with the knee bent at a comfortable angle while keeping your heel on the floor.       IF  YOU ARE TRANSFERRED TO A SKILLED REHAB FACILITY If the patient is transferred to a skilled rehab facility following release from the hospital, a list of the current medications will be sent to the facility for the patient to continue.  When discharged from the skilled rehab facility, please have the facility set up the patient's [place] prior to being released. Also, the skilled facility will be responsible for providing the patient with their medications at time of release from the facility to include their pain medication, the muscle relaxants, and their blood thinner medication. If the patient is still at the rehab facility at time of the two week follow up appointment, the skilled rehab facility will also need to assist the patient in arranging follow up appointment in our office and any transportation needs.  MAKE SURE YOU:  Understand these instructions.  Get help right away if you are not doing well or get worse.    Pick up stool softner and laxative for home use following surgery while on pain medications. Continue to use ice for pain and swelling after surgery. Do not use any lotions or creams on the incision until instructed by your surgeon.

## 2016-05-06 NOTE — Consult Note (Signed)
Cardiology Consultation Note  Patient ID: Jeffrey Haynes, MRN: [id number], DOB/AGE: 80-06-1925 80 y.o. Admit date: 05/02/2016   Date of Consult: 05/06/2016 Primary Physician: Golden Pop, MD Primary Cardiologist: New to [facility] Requesting Physician: Seth Bake, MD  Chief Complaint: Mechanical fall  Reason for Consult: New onset Afib with RVR  HPI: 82 y.o. male with h/o reported CAD which patient reports has been medically managed, COPD, HLD, familial tremor, PUD with history of bleeding gastric ulcers in 2013, syncope in the setting of orthostatic hypotension, HTN, and hypothyroidism who presented to [facility] on 5/26 after suffering a mechanical fall leading to a moderately displaced left femoral neck fracture now s/p left hemiarthroplasty on 05/03/2016. He developed COPD exacerbation on 5/28 leading to new onset Afib with RVR and transfer to ICU-->now back on ortho floor. Cardiology is consulted for further evaluation.  He self reports a cardiac cath in the 1990's without stenting. No further ischemic evaluations since. He does not have a cardiologist. He reports prior to the above mechanical fall he last fell in the 1990's. He has stable 2-pillow orthopnea. No early satiety. He was a previously smoker of 35 years at 2-3 packs daily. He quit in his 45's. He also used to drink ETOH heavily, but quit in his 19's. No illegal drugs.    Patient developed acute COPD exacerbation leading to the development of new onset Afib with RVR. He was transferred to ICU for initiation of amiodarone infusion, leading to conversion to sinus rhythm with heart rate in the 80's bpm. He has been continued on amiodarone pill. Telemetry showed Afib with RVR around 10 PM on 5/28, with the patient converting to sinus rhythm at 3:30 AM on 5/29. He has remained in sinus rhythm since for a total time in Afib of approximately 5 hours. He did not feel any palpitations. Troponin was negative x 1, K+ 4.2-->4.0 on 5/29, TSH 1.610, hgb  12.3-->12.1, Na 143-->143 today, SCr 0.91-->0.93. CXR upon admission on 5/26 showed COPD, no active disease. She did have a CTA chest PE protocol on 5/29 that was negative for PE. There was trace bilateral pleural fluid with changes concerning for pulmonary edema, diffuse coronary calcifications were seen, scattered right hilar and mediastinal nodes measuring up to 1.3 cm, and mild wall thickening along the distal esophagus. EKG upon admission on 5/26 showed sinus rhythm with heart rate of 75 bpm, no acute st/t changes. EKG on 5/28 showed Afib with RVR, 145 bpm, nonspecific st/t changes. Echo is pending. Currently, his only complaint is constipation and abdominal pain. He is yet to have a BM since his surgery. He remains on oxygen via nasal cannula.      Past Medical History  Diagnosis Date  . COPD (chronic obstructive pulmonary disease) ([place])   . CAD (coronary artery disease)   . Hyperlipidemia   . Familial tremor   . Bleeding ulcer 09/2012    a. gastric  . Orthostatic hypotension     a. leading to prior syncope  . Hypothyroidism   . Essential hypertension   . PAF (paroxysmal atrial fibrillation) (HCC)     a. in the post-op setting and associated with COPD exacerbation      Most Recent Cardiac Studies: none   Surgical History:  Past Surgical History  Procedure Laterality Date  . Hernia repair    . Spine surgery  1975    LS disk  . Hip arthroplasty Left 05/02/2016    Procedure: ARTHROPLASTY BIPOLAR HIP (HEMIARTHROPLASTY);  Surgeon: Jenny Reichmann  Robby Sermon, MD;  Location: [facility] ORS;  Service: Orthopedics;  Laterality: Left;     Home Meds: Prior to Admission medications   Medication Sig Start Date End Date Taking? Authorizing Provider  albuterol (PROVENTIL HFA;VENTOLIN HFA) 108 (90 BASE) MCG/ACT inhaler Inhale 2 puffs into the lungs every 6 (six) hours as needed for wheezing or shortness of breath. 11/06/15  Yes [name], MD  atorvastatin (LIPITOR) 20 MG tablet Take 20 mg by mouth at  bedtime.   Yes Historical Provider, MD  Ipratropium-Albuterol (COMBIVENT) 20-100 MCG/ACT AERS respimat Inhale 2 puffs into the lungs 2 (two) times daily. 11/06/15  Yes [name], MD  levothyroxine (SYNTHROID, LEVOTHROID) 75 MCG tablet Take 1 tablet (75 mcg total) by mouth daily before breakfast. 11/06/15  Yes [name], MD  metoprolol succinate (TOPROL-XL) 25 MG 24 hr tablet Take 1 tablet (25 mg total) by mouth daily. 11/06/15  Yes [name], MD    Inpatient Medications:  . sodium chloride   Intravenous Once  . albuterol  2.5 mg Nebulization Once  . amiodarone  400 mg Oral BID  . atorvastatin  20 mg Oral Daily  . bisacodyl  5 mg Oral Daily  . docusate sodium  100 mg Oral BID  . enoxaparin (LOVENOX) injection  40 mg Subcutaneous Q24H  . ipratropium-albuterol  3 mL Nebulization Q4H  . levothyroxine  75 mcg Oral QAC breakfast  . metoprolol succinate  25 mg Oral Daily  . pantoprazole  40 mg Oral BID AC      Allergies: No Known Allergies  Social History   Social History  . Marital Status: Married    Spouse Name: N/A  . Number of Children: N/A  . Years of Education: N/A   Occupational History  . Not on file.   Social History Main Topics  . Smoking status: Former Smoker    Types: Cigarettes    Quit date: 05/02/1978  . Smokeless tobacco: Former Systems developer    Types: [place] date: 10/02/2012  . Alcohol Use: No  . Drug Use: No  . Sexual Activity: Not on file   Other Topics Concern  . Not on file   Social History Narrative     Family History  Problem Relation Age of Onset  . Family history unknown: Yes     Review of Systems: Review of Systems  Constitutional: Positive for weight loss and malaise/fatigue. Negative for fever, chills and diaphoresis.  HENT: Negative for congestion.   Eyes: Negative for discharge.  Respiratory: Positive for shortness of breath. Negative for cough, hemoptysis, sputum production and wheezing.   Cardiovascular: Negative for  chest pain, palpitations, orthopnea, claudication, leg swelling and PND.  Gastrointestinal: Positive for abdominal pain and constipation. Negative for heartburn, nausea, vomiting and diarrhea.  Musculoskeletal: Positive for joint pain and falls. Negative for myalgias.  Skin: Negative for rash.  Neurological: Positive for weakness. Negative for dizziness, sensory change, speech change, focal weakness and loss of consciousness.  Endo/Heme/Allergies: Does not bruise/bleed easily.  Psychiatric/Behavioral: Negative for substance abuse. The patient is not nervous/anxious.   All other systems reviewed and are negative.   Labs:  Recent Labs  05/04/16 2055  TROPONINI <0.03   Lab Results  Component Value Date   WBC 9.6 05/03/2016   HGB 12.1* 05/05/2016   HCT 36.9* 05/03/2016   MCV 97.3 05/03/2016   PLT 161 05/03/2016    Recent Labs Lab 05/02/16 1543  05/05/16 0322 05/06/16 0349  NA  137  < > 134* 132*  K 5.2*  < > 4.0  --   CL 106  < > 105  --   CO2 24  < > 25  --   BUN 15  < > 12  --   CREATININE 0.95  < > 0.93  --   CALCIUM 9.0  < > 7.8*  --   PROT 6.8  --   --   --   BILITOT 1.5*  --   --   --   ALKPHOS 80  --   --   --   ALT 11*  --   --   --   AST 25  --   --   --   GLUCOSE 98  < > 136*  --   < > = values in this interval not displayed. Lab Results  Component Value Date   CHOL 117 11/06/2015   HDL 39* 11/06/2015   LDLCALC 57 11/06/2015   TRIG 105 11/06/2015   No results found for: DDIMER  Radiology/Studies:  Dg Chest 1 View  05/02/2016  CLINICAL DATA:  Fall today. EXAM: CHEST 1 VIEW COMPARISON:  10/03/2012 FINDINGS: There are fibrotic changes within the lungs. Mild hyperinflation. Heart is normal size. No acute airspace opacities or effusions. No acute bony abnormality. IMPRESSION: COPD, fibrotic changes.  No active disease. Electronically Signed   By: Rolm Baptise M.D.   On: 05/02/2016 15:45   Dg Pelvis 1-2 Views  05/02/2016  CLINICAL DATA:  Walking on a corner  and slipped, status post fall EXAM: PELVIS - 1-2 VIEW COMPARISON:  None. FINDINGS: Generalized osteopenia. Mildly displaced left femoral neck fracture. No hip dislocation. No other fracture or dislocation. IMPRESSION: Mildly displaced left femoral neck fracture. Electronically Signed   By: Kathreen Devoid   On: 05/02/2016 15:49   Ct Angio Chest Pe W/cm &/or Wo Cm  05/05/2016  CLINICAL DATA:  Acute onset of tachycardia and shortness of breath, status post left hip surgery. Initial encounter. EXAM: CT ANGIOGRAPHY CHEST WITH CONTRAST TECHNIQUE: Multidetector CT imaging of the chest was performed using the standard protocol during bolus administration of intravenous contrast. Multiplanar CT image reconstructions and MIPs were obtained to evaluate the vascular anatomy. CONTRAST:  75 mL of Isovue 370 IV contrast COMPARISON:  Chest radiograph performed 05/02/2016 FINDINGS: There is no evidence of pulmonary embolus. Trace bilateral pleural fluid is noted. Interstitial prominence is seen, with patchy peripheral opacities, concerning for mild pulmonary edema. Underlying mild fibrotic change is a concern. There is no evidence of pneumothorax. No masses are identified; no abnormal focal contrast enhancement is seen. Diffuse coronary artery calcifications are seen. Scattered right hilar and azygoesophageal recess nodes measure up to 1.3 cm in short axis. No pericardial effusion is seen. Scattered calcification is noted along the proximal great vessels. No axillary lymphadenopathy is seen. The visualized portions of the thyroid gland are unremarkable in appearance. The visualized portions of the liver and spleen are unremarkable. Mild wall thickening is noted along the distal esophagus, which could reflect mild esophagitis or chronic inflammation, depending on the patient's symptoms. No acute osseous abnormalities are seen. Mild degenerative change is noted at the lower cervical spine. Review of the MIP images confirms the above  findings. IMPRESSION: 1. No evidence of pulmonary embolus. 2. Trace bilateral pleural fluid. Interstitial prominence, with patchy peripheral opacities, concerning for mild pulmonary edema. 3. Underlying mild fibrotic change noted. 4. Diffuse coronary artery calcifications seen. 5. Scattered right hilar and mediastinal nodes  measure up to 1.3 cm in short axis. 6. Mild wall thickening along the distal esophagus, which could reflect mild esophagitis or chronic inflammation, depending on the patient's symptoms. Would correlate clinically. Electronically Signed   By: Garald Balding M.D.   On: 05/05/2016 00:25   Dg Hip Port Unilat With Pelvis 1v Left  05/03/2016  CLINICAL DATA:  Status post left hip arthroplasty placement. Initial encounter. EXAM: DG HIP (WITH OR WITHOUT PELVIS) 1V PORT LEFT COMPARISON:  Left femur radiographs performed earlier today at 3:12 p.m. FINDINGS: There has been interval placement of a left hip arthroplasty, which appears intact, without evidence of loosening. Overlying postoperative soft tissue air and scattered skin staples are seen. The right hip joint is unremarkable in appearance. The visualized bowel gas pattern is grossly unremarkable. IMPRESSION: Interval placement of left hip arthroplasty, which appears intact, without evidence of loosening. Electronically Signed   By: Garald Balding M.D.   On: 05/03/2016 01:29   Dg Femur Min 2 Views Left  05/02/2016  CLINICAL DATA:  Left femur pain after falling today. Initial encounter. EXAM: LEFT FEMUR 2 VIEWS COMPARISON:  None. FINDINGS: Moderately displaced proximal left femoral neck fracture is noted. The remaining portion of the femur appears normal. IMPRESSION: Moderate displaced proximal left femoral neck fracture. Electronically Signed   By: Marijo Conception, M.D.   On: 05/02/2016 15:42    EKG: upon admission on 5/26 showed sinus rhythm with heart rate of 75 bpm, no acute st/t changes. EKG on 5/28 showed Afib with RVR, 145 bpm,  nonspecific st/t changes  Telemetry:   Weights: Filed Weights   05/02/16 1431 05/04/16 2302  Weight: 185 lb (83.915 kg) 199 lb 15.3 oz (90.7 kg)     Physical Exam: Blood pressure 126/61, pulse 100, temperature 97.9 F (36.6 C), temperature source Oral, resp. rate 17, height '5\' 9"'$  (1.753 m), weight 199 lb 15.3 oz (90.7 kg), SpO2 93 %. Body mass index is 29.52 kg/(m^2). General: Well developed, well nourished, in no acute distress. Head: Normocephalic, atraumatic, sclera non-icteric, no xanthomas, nares are without discharge.  Neck: Negative for carotid bruits. JVD not elevated. Lungs: Coarse breath sounds bilaterally. Breathing is unlabored. Heart: RRR with S1 S2. No murmurs, rubs, or gallops appreciated. Abdomen: Hard and distended with faint to no bowel sounds. No hepatomegaly. No rebound/guarding. No obvious abdominal masses. Msk:  Strength and tone appear normal for age. Extremities: No clubbing or cyanosis. No edema.  Distal pedal pulses are 2+ and equal bilaterally. Neuro: Alert and oriented X 3. No facial asymmetry. No focal deficit. Moves all extremities spontaneously. Psych:  Responds to questions appropriately with a normal affect.    Assessment and Plan:  Principal Problem:   Femur fracture ([place]) Active Problems:   New onset atrial fibrillation (HCC)   Respiratory distress   COPD (chronic obstructive pulmonary disease) ([place])    1. New onset Afib with RVR: -Likely in the setting of his post-operative course with IV fluids and COPD exacerbation -Was in Afib for approximately 5 hours and converted to sinus rhythm on amiodarone infusion -Now on PO amiodarone and Lopressor 25 mg bid -Given he has not had a bowel movement yet there is some concern for decreased absorption of PO medications -Heart rate has been slowly increasing from the 70's to now 90's bpm -Increase Toprol XL to 25 mg bid -Continue amiodarone PO at 400 mg bid x 1 week, then 200 mg bid x 1 week, then  200 mg daily thereafter -Given this  was an isolated episode of Afib in the setting of the above and for a short duration will hold off on full-dose anticoagulation at this time -If he redevelops any further Afib he may require long term, full-dose anticoagulation -CHADS2VASc at least 4 (HTN, age x 2, vascular disease)  2. Mechanical fall s/p left hemiarthroplasty: -Needs PT -Would watch heart rate with initial work out with PT -Per IM/orthopedics   3. Hypothyroidism: -TSH ok -On repletion   4. HTN: -Toprol XL as above  5. History of peptic ulcer bleed: -HGB stable -Monitor closely if he were to start long term, full-dose anticoagulation   6. Orthostatic hypotension: -Stable  7. Acute respiratory distress: -Improved -Likely in the setting of COPD exacerbation  -Wean O2 as able   Signed, Christell Faith, PA-C Pager: [phone] 05/06/2016, 10:02 AM

## 2016-05-06 NOTE — Progress Notes (Signed)
*  PRELIMINARY RESULTS* Echocardiogram 2D Echocardiogram has been performed.  Laqueta Jean Hege 05/06/2016, 8:19 AM

## 2016-05-06 NOTE — Progress Notes (Signed)
Physical Therapy Treatment Patient Details Name: Jeffrey Haynes MRN: [id number] DOB: 1925/11/03 Today's Date: 05/06/2016    History of Present Illness Normally active 82 y/o male who suffered a fall with L hip fx, needing replacement (posterior approach)    PT Comments    Pt agreeable to up in chair. Denies pain initially, but notes pain with sit/stand; however, does not quantify. Pt continues to be a heavy assist to sit edge of bed. Mod A x 2 for sit to/from stand with rolling walker; pt unable to find center of gravity, nor is pt able to activate quads/glutes together to support stand. After 2 attempts with rolling walker; sit to stand performed 2 times to allow for stand pivot to chair. Pt does attempt to take steps with stand pivot transfer; however, non effective/supportive steps. Pt unaware of posterior hip precautions and has to be educated with functional mobility. Pt comfortable in chair and performs ankle pumps and isometric exercises. Pt/family encouraged to perform throughout afternoon. Pt fatigued and falls asleep post session. Continue PT to progress strength, endurance and all functional mobility for improved functional and progression to ambulation.   Follow Up Recommendations  SNF     Equipment Recommendations  Rolling walker with 5" wheels    Recommendations for Other Services       Precautions / Restrictions Precautions Precautions: Fall;Posterior Hip Restrictions Weight Bearing Restrictions: Yes LLE Weight Bearing: Weight bearing as tolerated    Mobility  Bed Mobility Overal bed mobility: Needs Assistance Bed Mobility: Supine to Sit     Supine to sit: Mod assist;HOB elevated Sit to supine: Max assist   General bed mobility comments: Assist for LEs and trunk; 1-2 assist  Transfers Overall transfer level: Needs assistance Equipment used: Rolling walker (2 wheeled);None Transfers: Sit to/from [facility] Sit to Stand: Mod assist;+2 physical  assistance (performed 2x with rw; 2x without) Stand pivot transfers: Mod assist;+2 physical assistance       General transfer comment: Retropulsive and R lateral lean; poor/unable to find center of gravity. Weak quads and hip extensors. unable to activate together; with quad set, glutes give and vice versa. 2 attempts with rw, 2 attempts with stand pivot with successful pivot second attempt with Mod of 2; pt attempts to step feet, but unable to support/balance self.  Ambulation/Gait             General Gait Details: unable due to no stand balance/strength   Stairs            Wheelchair Mobility    Modified Rankin (Stroke Patients Only)       Balance Overall balance assessment: Needs assistance Sitting-balance support: Feet supported;Bilateral upper extremity supported Sitting balance-Leahy Scale: Poor Sitting balance - Comments: Pt continues to require assist for sitting balance; with cueing able to tolerate short periods with Min A, but ultimately retropulsive with R lateral lean Postural control: Posterior lean;Right lateral lean Standing balance support: Bilateral upper extremity supported Standing balance-Leahy Scale: Poor Standing balance comment: Retropulsive; unable to find center of gravity. R lateral lean as well.                    Cognition Arousal/Alertness: Awake/alert Behavior During Therapy: WFL for tasks assessed/performed Overall Cognitive Status: Within Functional Limits for tasks assessed                      Exercises Total Joint Exercises Ankle Circles/Pumps: AROM;Both;20 reps;Supine Quad Sets: Strengthening;Both;10 reps;Supine Gluteal Sets:  Strengthening;Both;10 reps;Supine Towel Squeeze: Strengthening;Both;10 reps;Supine Short Arc Quad: AAROM;10 reps;Supine;Both (R AROM) Heel Slides: AAROM;20 reps;Supine;Both (R AROM) Hip ABduction/ADduction: AAROM;20 reps;Supine;Both (R AROM) Straight Leg Raises: AAROM;10  reps;Supine;Both Other Exercises Other Exercises: sitting balance edge of bed 5 minutes during breathing treatment    General Comments        Pertinent Vitals/Pain Pain Assessment: No/denies pain Pain Score: 10-Worst pain ever (with sitting edge of bed; otherwise denies) Pain Location: L hip Pain Descriptors / Indicators: Sharp Pain Intervention(s): Limited activity within patient's tolerance;Monitored during session;Premedicated before session    Home Living                      Prior Function            PT Goals (current goals can now be found in the care plan section) Progress towards PT goals: Progressing toward goals (slowly)    Frequency  BID    PT Plan Current plan remains appropriate    Co-evaluation             End of Session Equipment Utilized During Treatment: Oxygen Activity Tolerance: Patient limited by fatigue;Patient limited by pain (weakness) Patient left: with call bell/phone within reach;with SCD's reapplied;in chair;with chair alarm set;with family/visitor present     Time: 1497-0263 PT Time Calculation (min) (ACUTE ONLY): 31 min  Charges:  $Therapeutic Exercise: 23-37 mins $Therapeutic Activity: 23-37 mins                    G CodesCharlaine Dalton, PTA 05/06/2016, 1:47 PM

## 2016-05-06 NOTE — Care Management Important Message (Signed)
Important Message  Patient Details  Name: MAXSON ODDO MRN: [id number] Date of Birth: 12-03-1925   Medicare Important Message Given:  Yes    Juliann Pulse A Soliyana Mcchristian 05/06/2016, 12:41 PM

## 2016-05-06 NOTE — Progress Notes (Signed)
PT Cancellation Note  Patient Details Name: KASSIDY FRANKSON MRN: [id number] DOB: [birth year]   Cancelled Treatment:    Reason Eval/Treat Not Completed: Patient at procedure or test/unavailable. Treatment attempted; pt not available. Will re attempt treatment later today.    Charlaine Dalton, PTA 05/06/2016, 10:19 AM

## 2016-05-06 NOTE — Progress Notes (Signed)
Clinical Education officer, museum (CSW) met with patient and his son at bedside and presented bed offers. Patient and son chose [facility]. [facility] liaison is aware of accepted bed offer. Per MD patient is not medically stable for D/C today. CSW will continue to follow and assist as needed.   Blima Rich, LCSW [phone]

## 2016-05-07 ENCOUNTER — Inpatient Hospital Stay: Payer: Medicare Other

## 2016-05-07 ENCOUNTER — Ambulatory Visit: Payer: Medicare Other | Admitting: Family Medicine

## 2016-05-07 ENCOUNTER — Encounter: Payer: Self-pay | Admitting: Surgery

## 2016-05-07 DIAGNOSIS — S7292XA Unspecified fracture of left femur, initial encounter for closed fracture: Secondary | ICD-10-CM

## 2016-05-07 DIAGNOSIS — I4891 Unspecified atrial fibrillation: Secondary | ICD-10-CM

## 2016-05-07 LAB — POTASSIUM: POTASSIUM: 4 mmol/L (ref 3.5–5.1)

## 2016-05-07 LAB — SODIUM: SODIUM: 134 mmol/L — AB (ref 135–145)

## 2016-05-07 MED ORDER — FLEET ENEMA 7-19 GM/118ML RE ENEM
1.0000 | ENEMA | Freq: Once | RECTAL | Status: AC
  Administered 2016-05-07: 1 via RECTAL

## 2016-05-07 MED ORDER — BISACODYL 10 MG RE SUPP
10.0000 mg | Freq: Once | RECTAL | Status: AC
  Administered 2016-05-07: 10 mg via RECTAL
  Filled 2016-05-07: qty 1

## 2016-05-07 MED ORDER — IPRATROPIUM-ALBUTEROL 0.5-2.5 (3) MG/3ML IN SOLN
3.0000 mL | Freq: Four times a day (QID) | RESPIRATORY_TRACT | Status: DC
  Administered 2016-05-07 – 2016-05-08 (×5): 3 mL via RESPIRATORY_TRACT
  Filled 2016-05-07 (×5): qty 3

## 2016-05-07 NOTE — Progress Notes (Signed)
Per MD patient is not medically stable for D/C today. Plan is for patient to D/C to [facility]. [facility] liaison is aware of above. Clinical Social Worker (CSW) will continue to follow and assist as needed.   Blima Rich, LCSW [phone]

## 2016-05-07 NOTE — Progress Notes (Signed)
NG tube inserted to 34mrk, patient tolerated well, called for xray and md for verification of placement. Will continue to monitor.

## 2016-05-07 NOTE — Plan of Care (Addendum)
2nd xray revealed need for further advancing.  Radiologist contacted to suggest how far to advance. RN advanced 10 cm. (from mark 26 to 36) Attempted to start suction and pt complained of pain and began to cough. Suction turned off.  No tube seen in back of pt's  throat.  Xray ordered to confirm placement.

## 2016-05-07 NOTE — Plan of Care (Signed)
Problem: Safety: Goal: Ability to remain free from injury will improve Outcome: Progressing Pt remaining free from falls this shift.  Problem: Pain Managment: Goal: General experience of comfort will improve Outcome: Progressing No complaints of pain this shift.  Problem: Fluid Volume: Goal: Ability to maintain a balanced intake and output will improve Outcome: Progressing Still remaining on iv hydration.  Problem: Bowel/Gastric: Goal: Will not experience complications related to bowel motility Outcome: Not Progressing Pt has not been able to move bowels.

## 2016-05-07 NOTE — Progress Notes (Signed)
Subjective: 5 Days Post-Op Procedure(s) (LRB): ARTHROPLASTY BIPOLAR HIP (HEMIARTHROPLASTY) (Left) Patient reports pain as moderate.   Patient is well, and has had no acute complaints or problems. Pt suffering from post-op ileus. Denies any CP or stomach pain.  SOB improved. We will continue therapy today.  Plan is to go Rehab after hospital stay.  Objective: Vital signs in last 24 hours: Temp:  [97.7 F (36.5 C)-99 F (37.2 C)] 98 F (36.7 C) (05/31 0350) Pulse Rate:  [89-109] 89 (05/31 0350) Resp:  [16-24] 16 (05/31 0350) BP: (101-126)/(52-64) 105/53 mmHg (05/31 0350) SpO2:  [92 %-95 %] 95 % (05/31 0350)  Intake/Output from previous day: 05/30 0701 - 05/31 0700 In: 2045.8 [P.O.:360; I.V.:1685.8] Out: 0  Intake/Output this shift:     Recent Labs  05/05/16 0322  HGB 12.1*   No results for input(s): WBC, RBC, HCT, PLT in the last 72 hours.  Recent Labs  05/05/16 0322 05/06/16 0349 05/07/16 0606  NA 134* 132* 134*  K 4.0  --   --   CL 105  --   --   CO2 25  --   --   BUN 12  --   --   CREATININE 0.93  --   --   GLUCOSE 136*  --   --   CALCIUM 7.8*  --   --    No results for input(s): LABPT, INR in the last 72 hours.  EXAM General - Patient is Alert, Appropriate and Oriented Extremity - Neurovascular intact Sensation intact distally Intact pulses distally Dorsiflexion/Plantar flexion intact Dressing - moderate drainage Motor Function - intact, moving foot and toes well on exam.   Abdomen distended with decreased bowel sounds.  Positive tympany throughout the abdomen.  Past Medical History  Diagnosis Date  . COPD (chronic obstructive pulmonary disease) ([place])   . CAD (coronary artery disease)   . Hyperlipidemia   . Familial tremor   . Bleeding ulcer 09/2012    a. gastric  . Orthostatic hypotension     a. leading to prior syncope  . Hypothyroidism   . Essential hypertension   . PAF (paroxysmal atrial fibrillation) ([facility])     a. in the post-op setting  and associated with COPD exacerbation    Assessment/Plan:   5 Days Post-Op Procedure(s) (LRB): ARTHROPLASTY BIPOLAR HIP (HEMIARTHROPLASTY) (Left) Principal Problem:   Femur fracture ([place]) Active Problems:   COPD (chronic obstructive pulmonary disease) ([place])   New onset atrial fibrillation (H[facility])   Respiratory distress   Fall   Status post hip hemiarthroplasty  Estimated body mass index is 29.52 kg/(m^2) as calculated from the following:   Height as of this encounter: '5\' 9"'$  (1.753 m).   Weight as of this encounter: 90.7 kg (199 lb 15.3 oz). Advance diet Up with therapy  Plan on discharge to SNF once medically cleared Remove staples and apply steri strips 04/15/16. Follow up with [facility] in 6 weeks.  Post-op Ileus. WIll obtain KUB and K+ levels this AM.  May need consult to general surgery for placement of a NG Tube for decompression. Denies any bowel movements. Continue to get up with therapy, may help with bowel distention.  DVT Prophylaxis - Lovenox, Foot Pumps and TED hose Weight-Bearing as tolerated to left leg D/C O2 and Pulse OX and try on [place] PA-C [place] 05/07/2016, 7:57 AM

## 2016-05-07 NOTE — Progress Notes (Signed)
Physical Therapy Treatment Patient Details Name: Jeffrey Haynes MRN: [id number] DOB: [birth year] Today's Date: 05/07/2016    History of Present Illness Normally active 82 y/o male who suffered a fall with L hip fx, needing replacement (posterior approach)    PT Comments    Pt demonstrating improved bed mobility, transfer, seated and stand balance as well as ambulation to the chair today. Pt requires at least Min A occasionally edge of bed and with stand/ambulation due to unsteadiness. Pt does sit prematurely requiring Mod A to control descent into chair; pt able to readjust himself better in the chair with upper extremity strength. Pt's O2 saturation on room air throughout session 94-95%. Notified nursing that pt left on room air; pt and son informed to replace O2 if shortness of breath/distress occurs, and O2 cannula draped over back of chair for easy access. Continue PT to progress range of motion, strength, balance, transfers and ambulation to improve overall functional mobility.   Follow Up Recommendations  SNF     Equipment Recommendations  Rolling walker with 5" wheels    Recommendations for Other Services       Precautions / Restrictions Precautions Precautions: Fall;Posterior Hip Restrictions Weight Bearing Restrictions: Yes LLE Weight Bearing: Weight bearing as tolerated    Mobility  Bed Mobility Overal bed mobility: Modified Independent Bed Mobility: Supine to Sit Rolling: Mod assist   Supine to sit: Modified independent (Device/Increase time)     General bed mobility comments: Significant increased time/effort; heavy use of rails, but able to complete without physical assist  Transfers Overall transfer level: Needs assistance Equipment used: Rolling walker (2 wheeled);None Transfers: Sit to/from Stand Sit to Stand: Min assist;From elevated surface         General transfer comment: Cues for hand placement; slow to rise and cues for full upright posture. Min A  for steadiness. Mod A for stand to sit, as pt sits prematurely with uncontrolled descent   Ambulation/Gait Ambulation/Gait assistance: Min assist Ambulation Distance (Feet): 3 Feet Assistive device: Rolling walker (2 wheeled) Gait Pattern/deviations: Step-to pattern;Decreased step length - right;Decreased step length - left;Decreased dorsiflexion - right;Decreased dorsiflexion - left;Decreased weight shift to left;Antalgic;Trunk flexed     General Gait Details: Short unsteady steps bed to chair with impulsive sit prematurely   Stairs            Wheelchair Mobility    Modified Rankin (Stroke Patients Only)       Balance Overall balance assessment: Needs assistance Sitting-balance support: Feet supported;Bilateral upper extremity supported Sitting balance-Leahy Scale: Fair Sitting balance - Comments: Requires use of UEs for support otherwise R posterolateral lean/fall Postural control: Posterior lean;Right lateral lean Standing balance support: Bilateral upper extremity supported Standing balance-Leahy Scale: Poor Standing balance comment: Improved from yesterdays session, but requires at least Min A support in stand with rw                    Cognition Arousal/Alertness: Awake/alert Behavior During Therapy: WFL for tasks assessed/performed Overall Cognitive Status: Within Functional Limits for tasks assessed       Memory: Decreased recall of precautions (Unable to recite any from memory. Reducated pt/family)              Exercises Total Joint Exercises Ankle Circles/Pumps: AROM;Both;20 reps;Seated Quad Sets: Strengthening;Both;Supine;15 reps Gluteal Sets: Strengthening;Both;Supine;15 reps Short Arc Quad: Supine;Both;AROM;20 reps Heel Slides: AAROM;20 reps;Supine;Both Hip ABduction/ADduction: AROM;Both;10 reps;Seated (RROM R) Straight Leg Raises: AAROM;Supine;Both;20 reps Long Arc Quad: AROM;Left;10 reps;Seated Other Exercises Other  Exercises: use of  incentive spirometer 10x while on room air    General Comments        Pertinent Vitals/Pain Pain Assessment: No/denies pain (respods with pain in seated and stand positions L hip) Pain Score: 10-Worst pain ever (initial as pt on L side no pillow LEs crossed. 0 post rx) Pain Location: L hip Pain Descriptors / Indicators: Constant;Jabbing;Sharp Pain Intervention(s): Repositioned    Home Living                      Prior Function            PT Goals (current goals can now be found in the care plan section) Progress towards PT goals: Progressing toward goals    Frequency  BID    PT Plan Current plan remains appropriate    Co-evaluation             End of Session Equipment Utilized During Treatment: Gait belt (ex on room air with O2 sats between 85% and 94%) Activity Tolerance: Patient tolerated treatment well;Patient limited by fatigue;Patient limited by pain Patient left: with call bell/phone within reach;with family/visitor present;in chair;with chair alarm set     Time: 1345-1409 PT Time Calculation (min) (ACUTE ONLY): 24 min  Charges:  $Gait Training: 8-22 mins $Therapeutic Exercise: 8-22 mins                    G Codes:      Charlaine Dalton, PTA 05/07/2016, 2:20 PM

## 2016-05-07 NOTE — Progress Notes (Signed)
Physical Therapy Treatment Patient Details Name: Jeffrey Haynes MRN: [id number] DOB: 17-Jan-1925 Today's Date: 05/07/2016    History of Present Illness Normally active 82 y/o male who suffered a fall with L hip fx, needing replacement (posterior approach)    PT Comments    Pt initially found on left side, no pillow between Bilateral lower extremities with legs crossing. Pt/son note pt is in position due to suppository placement > 30 minutes. Pain in currently 10/10 in left hip; pt repositioned with Mod A to supine with relief of pain. Pt/son re educated on hip precautions. Pt agreeable to participation in Bilateral lower extremity exercises. Pt participates on room air with O2 saturation monitored closely; O2 saturation  between 85 and 94%. Ultimately pt placed back on 2 liters O2 at session's end, as pt unable to maintain over 90% with pursed lip breathing and incentive spirometer at rest. Spoke with nursing post session regarding when pt can get up for attempted bowel movement; nursing to call for assist with transfer to bedside commode. Continue PT for progression of strength, endurance and all mobility to improve function.   Follow Up Recommendations  SNF     Equipment Recommendations  Rolling walker with 5" wheels    Recommendations for Other Services       Precautions / Restrictions Precautions Precautions: Fall;Posterior Hip Restrictions Weight Bearing Restrictions: Yes LLE Weight Bearing: Weight bearing as tolerated    Mobility  Bed Mobility Overal bed mobility: Needs Assistance Bed Mobility: Rolling Rolling: Mod assist            Transfers                 General transfer comment: not tested at this time; pt had suppository placed ; will wait until nursing wishes pt up to attempt bowel movement  Ambulation/Gait                 Stairs            Wheelchair Mobility    Modified Rankin (Stroke Patients Only)       Balance                                     Cognition Arousal/Alertness: Awake/alert Behavior During Therapy: WFL for tasks assessed/performed Overall Cognitive Status: Within Functional Limits for tasks assessed       Memory: Decreased recall of precautions (Unable to recite any from memory. Reducated pt/family)              Exercises Total Joint Exercises Ankle Circles/Pumps: AROM;Both;20 reps;Supine Quad Sets: Strengthening;Both;Supine;15 reps Gluteal Sets: Strengthening;Both;Supine;15 reps Short Arc Quad: Supine;Both;AROM;20 reps Heel Slides: AAROM;20 reps;Supine;Both Hip ABduction/ADduction: AAROM;20 reps;Supine;Left (RROM R) Straight Leg Raises: AAROM;Supine;Both;20 reps Other Exercises Other Exercises: use of incentive spirometer 10x while on room air    General Comments        Pertinent Vitals/Pain Pain Assessment: 0-10 Pain Score: 10-Worst pain ever (initial as pt on L side no pillow LEs crossed. 0 post rx) Pain Location: L hip Pain Descriptors / Indicators: Constant;Jabbing;Sharp Pain Intervention(s): Repositioned    Home Living                      Prior Function            PT Goals (current goals can now be found in the care plan section) Progress towards PT goals: Progressing toward  goals (slowly)    Frequency  BID    PT Plan Current plan remains appropriate    Co-evaluation             End of Session Equipment Utilized During Treatment: Oxygen (ex on room air with o2 sats between 85% and 94%) Activity Tolerance: Other (comment);Patient tolerated treatment well (limited by suppository placement ) Patient left: in bed;with call bell/phone within reach;with bed alarm set;with family/visitor present;with SCD's reapplied     Time: 0488-8916 PT Time Calculation (min) (ACUTE ONLY): 25 min  Charges:  $Therapeutic Exercise: 23-37 mins                    G Codes:      Charlaine Dalton, PTA 05/07/2016, 11:40 AM

## 2016-05-07 NOTE — Plan of Care (Addendum)
3rd xray for placement revealed NG still in esophagus and still needing advancement.  AC informed of situation and suggested to remove and possibly retry under flora in AM if needed.  NG removed and prime doc notified of situation.  If pt begins vomitting, the NG will be re-attempted. Pt showing no s/sx of distress or discomfort.

## 2016-05-07 NOTE — Progress Notes (Signed)
New order to place a NG tube with intermittent low wall suction per verbal order from [place].

## 2016-05-07 NOTE — Progress Notes (Signed)
NG tube placed. Called ordering MD to get order for xray to verification, awaiting response

## 2016-05-07 NOTE — Progress Notes (Signed)
[place] [facility] [place] NAME: Jeffrey Haynes    MR#:  [id number]  DATE OF BIRTH:  04/19/1925  SUBJECTIVE:  CHIEF COMPLAINT:   Chief Complaint  Patient presents with  . Fall  Patient is 82 year old Caucasian male with medical history significant for the history of COPD, coronary artery disease, hyperlipidemia, familial tremor, peptic ulcer disease who presents to the hospital with complaints of fall after he slipped on the tile floor. Patient was brought to emergency room in he was found to have left femur fracture. He underwent left hemiarthroplasty on 05/03/2016, Some complains of left hip pain, but overall feels comfortable. The patient was noted to have acute COPD exacerbation ,  developed A. fib RVR and was transported to ICU for amiodarone infusion. He converted back to sinus rhythm, heart rate in 80s.  Patient feels poorly today, complains of abdominal distention, Patient had a medium bowel movement today, however. His abdomen remains distended, uncomfortable     Review of Systems  Constitutional: Negative for fever, chills and weight loss.  HENT: Negative for congestion.   Eyes: Negative for blurred vision and double vision.  Respiratory: Negative for cough, sputum production, shortness of breath and wheezing.   Cardiovascular: Negative for chest pain, palpitations, orthopnea, leg swelling and PND.  Gastrointestinal: Negative for nausea, vomiting, abdominal pain, diarrhea, constipation and blood in stool.  Genitourinary: Negative for dysuria, urgency, frequency and hematuria.  Musculoskeletal: Positive for joint pain. Negative for falls.  Neurological: Negative for dizziness, tremors, focal weakness and headaches.  Endo/Heme/Allergies: Does not bruise/bleed easily.  Psychiatric/Behavioral: Negative for depression. The patient does not have insomnia.     VITAL SIGNS: Blood pressure 127/52, pulse 90, temperature 98.5 F (36.9 C),  temperature source Oral, resp. rate 18, height '5\' 9"'$  (1.753 m), weight 90.7 kg (199 lb 15.3 oz), SpO2 94 %.  PHYSICAL EXAMINATION:   GENERAL:  82 y.o.-year-old patient lying in the bed with no acute distress.  EYES: Pupils equal, round, reactive to light and accommodation. No scleral icterus. Extraocular muscles intact.  HEENT: Head atraumatic, normocephalic. Oropharynx and nasopharynx clear.  NECK:  Supple, no jugular venous distention. No thyroid enlargement, no tenderness.  LUNGS: Better air entrance bilaterally , no wheezing, few bilateral scattered rales,rhonchi , but no crepitation. Intermittent use of accessory muscles of respiration, especially moving around.  CARDIOVASCULAR: S1, S2 is regular. No murmurs, rubs, or gallops.  ABDOMEN: Moderately firm, no significant tenderness, no rebound or guarding , distended. Bowel sounds markedly diminished. No organomegaly or mass.  EXTREMITIES: No pedal edema, cyanosis, or clubbing. Left hip dressing is intact. No bleeding, swelling or pain on palpation NEUROLOGIC: Cranial nerves II through XII are intact. Muscle strength 5/5 in all extremities. Sensation intact. Gait not checked.  PSYCHIATRIC: The patient is alert and oriented x 3.  SKIN: No obvious rash, lesion, or ulcer.   ORDERS/RESULTS REVIEWED:   CBC  Recent Labs Lab 05/02/16 1543 05/03/16 0453 05/05/16 0322  WBC 11.0* 9.6  --   HGB 14.1 12.3* 12.1*  HCT 41.6 36.9*  --   PLT 186 161  --   MCV 94.4 97.3  --   MCH 32.0 32.5  --   MCHC 34.0 33.4  --   RDW 14.1 13.5  --   LYMPHSABS 1.8 1.2  --   MONOABS 0.8 0.7  --   EOSABS 0.1 0.1  --   BASOSABS 0.0 0.0  --    ------------------------------------------------------------------------------------------------------------------  Chemistries  Recent Labs Lab 05/02/16 1543 05/03/16 0453 05/04/16 0509 05/05/16 0322 05/06/16 0349 05/07/16 0606  NA 137 138 134* 134* 132* 134*  K 5.2* 4.1 4.2 4.0  --  4.0  CL 106 109 102 105   --   --   CO2 '24 25 27 25  '$ --   --   GLUCOSE 98 103* 110* 136*  --   --   BUN '15 13 11 12  '$ --   --   CREATININE 0.95 0.91 1.11 0.93  --   --   CALCIUM 9.0 7.9* 8.1* 7.8*  --   --   AST 25  --   --   --   --   --   ALT 11*  --   --   --   --   --   ALKPHOS 80  --   --   --   --   --   BILITOT 1.5*  --   --   --   --   --    ------------------------------------------------------------------------------------------------------------------ estimated creatinine clearance is 61.1 mL/min (by C-G formula based on Cr of 0.93). ------------------------------------------------------------------------------------------------------------------  Recent Labs  05/06/16 0349  TSH 1.610    Cardiac Enzymes  Recent Labs Lab 05/04/16 2055  TROPONINI <0.03   ------------------------------------------------------------------------------------------------------------------ Invalid input(s): POCBNP ---------------------------------------------------------------------------------------------------------------  RADIOLOGY: Dg Abd 1 View  05/06/2016  CLINICAL DATA:  Abdominal pain. Status post left hip arthroplasty for left femoral neck fracture. EXAM: ABDOMEN - 1 VIEW COMPARISON:  Pelvic film on 05/02/2016. CT of the abdomen and pelvis on 10/02/2012 FINDINGS: There is increasing gaseous distention of the colon consistent with postoperative ileus. No gross signs of free air. Calcified gallstones visualized. Bony structures show spondylosis of the lumbar spine. IMPRESSION: 1. Gaseous distention of the colon consistent with postoperative ileus. 2. Calcified gallstones. Electronically Signed   By: Aletta Edouard M.D.   On: 05/06/2016 10:19   Dg Abd 2 Views  05/07/2016  CLINICAL DATA:  Postop ileus. EXAM: ABDOMEN - 2 VIEW COMPARISON:  05/06/2016 and 05/02/2016 FINDINGS: Examination demonstrates air-filled mildly prominent colonic loops without significant change. There is moderate fecal retention over the right  colon. Air is present over the rectosigmoid colon. There is no free peritoneal air. Evidence of cholelithiasis. Several nonspecific calcifications over the right lower quadrant unchanged. Several pelvic phleboliths. There are moderate degenerate changes of the spine. Left hip arthroplasty intact. IMPRESSION: Stable air-filled mildly dilated colon likely postop ileus. Moderate cholelithiasis. Electronically Signed   By: Marin Olp M.D.   On: 05/07/2016 08:37    EKG:  Orders placed or performed during the hospital encounter of 05/02/16  . ED EKG  . ED EKG  . EKG 12-Lead  . EKG 12-Lead  . EKG 12-Lead  . EKG 12-Lead    ASSESSMENT AND PLAN:  Principal Problem:   Femur fracture ([facility]) Active Problems:   COPD (chronic obstructive pulmonary disease) ([place])   New onset atrial fibrillation ([facility])   Respiratory distress   Fall   Status post hip hemiarthroplasty #1. Mildly displaced left femoral neck fracture, status post left hip arthroplasty on 05/03/2016 by Dr. Roland Rack, continue pain control, adequate, continue anticoagulation with Lovenox at 40 mg subcutaneously once a day, patient Will need to go to skilled nursing facility for rehabilitation, per physical therapist recommendations #2. Acute posthemorrhagic anemia, about 2 g hemoglobin drop after surgery, Stable , no need to transfuse  #3. Leukocytosis, likely reactive, resolved #4. History of coronary artery disease, stable, continue outpatient medications #  5. Hyperkalemia, resolved #6. Ileus, postoperative, so patient nothing by mouth, IV fluids, Reglan, supportive therapy, place NG tubeToday, follow for possible aspirations #7. Constipation,  enema yesterday with minimal output , medium bowel movement today #8 . COPD with exacerbation, likely due to aspirations, improved significantly, continue  DuoNeb's, budesonide , following closely for recurrent aspirations #9 A. fib RVR, converted to sinus rhythm on amiodarone intravenously, continue  patient on amiodarone orally, , cardiology consultation is appreciated, echocardiogram was unremarkable, no need for anticoagulation due to short-lived atrial fibrillation, per cardiologist  Management plans discussed with the patient, family and they are in agreement.   DRUG ALLERGIES: No Known Allergies  CODE STATUS:     Code Status Orders        Start     Ordered   05/03/16 0015  Full code   Continuous     05/03/16 0014    Code Status History    Date Active Date Inactive Code Status Order ID Comments User Context   This patient has a current code status but no historical code status.    Advance Directive Documentation        Most Recent Value   Type of Advance Directive  Healthcare Power of Attorney   Pre-existing out of facility DNR order (yellow form or pink MOST form)     "MOST" Form in Place?        TOTAL TIME TAKING CARE OF THIS PATIENT: 40 minutes.   Theodoro Grist M.D on 05/07/2016 at 5:05 PM  Between 7am to 6pm - Pager - [phone]  After 6pm go to [email or web address] - password [email or web address]  Office  [phone]  CC: Primary care physician; Golden Pop, MD

## 2016-05-08 ENCOUNTER — Telehealth: Payer: Self-pay | Admitting: Family Medicine

## 2016-05-08 ENCOUNTER — Inpatient Hospital Stay: Payer: Medicare Other

## 2016-05-08 LAB — SURGICAL PATHOLOGY

## 2016-05-08 LAB — CREATININE, SERUM
CREATININE: 0.93 mg/dL (ref 0.61–1.24)
GFR calc Af Amer: >60 mL/min (ref >60)

## 2016-05-08 MED ORDER — ACETAMINOPHEN 160 MG/5ML PO SOLN
650.0000 mg | Freq: Four times a day (QID) | ORAL | Status: DC | PRN
  Administered 2016-05-08: 650 mg via ORAL
  Filled 2016-05-08 (×2): qty 20.3

## 2016-05-08 MED ORDER — ALBUTEROL SULFATE (2.5 MG/3ML) 0.083% IN NEBU
3.0000 mL | INHALATION_SOLUTION | RESPIRATORY_TRACT | Status: DC
  Administered 2016-05-08 – 2016-05-09 (×5): 3 mL via RESPIRATORY_TRACT
  Filled 2016-05-08 (×5): qty 3

## 2016-05-08 MED ORDER — CETYLPYRIDINIUM CHLORIDE 0.05 % MT LIQD
7.0000 mL | Freq: Three times a day (TID) | OROMUCOSAL | Status: DC
  Administered 2016-05-08 – 2016-05-11 (×9): 7 mL via OROMUCOSAL

## 2016-05-08 MED ORDER — METOPROLOL SUCCINATE ER 25 MG PO TB24
25.0000 mg | ORAL_TABLET | Freq: Every day | ORAL | Status: DC
  Administered 2016-05-08 – 2016-05-13 (×6): 25 mg via ORAL
  Filled 2016-05-08 (×6): qty 1

## 2016-05-08 MED ORDER — DOCUSATE SODIUM 50 MG/5ML PO LIQD
100.0000 mg | Freq: Two times a day (BID) | ORAL | Status: DC
Start: 2016-05-08 — End: 2016-05-11
  Administered 2016-05-08 – 2016-05-11 (×6): 100 mg via ORAL
  Filled 2016-05-08 (×7): qty 10

## 2016-05-08 NOTE — Progress Notes (Signed)
Physical Therapy Treatment Patient Details Name: Jeffrey Haynes MRN: [id number] DOB: Jul 24, 1925 Today's Date: 05/08/2016    History of Present Illness Normally active 82 y/o male who suffered a fall with L hip fx, needing replacement (posterior approach)    PT Comments    Pt quite fatigued today due to recent events last night and difficulty sleeping. With gentle encouragement, pt agreeable to short session of PT. Son notes pt will have NG tube placement attempted again later today. Pt needs a little more assist with bed mobility today due to fatigue. Pt does; however, show continued improvement in sit to stand transfer and stand balance. Pt instructed in and tolerated stand exercises for Left lower extremity. Pt fatigues quickly and does require increased assist for stand to sit transfer. Pt does not wish to ambulate currently due to extreme fatigue and abdominal discomfort. Will attempt treatment this afternoon as appropriate to progress strength, endurance and all functional mobility.   Follow Up Recommendations  SNF     Equipment Recommendations  Rolling walker with 5" wheels    Recommendations for Other Services       Precautions / Restrictions Precautions Precautions: Fall;Posterior Hip Restrictions Weight Bearing Restrictions: Yes LLE Weight Bearing: Weight bearing as tolerated    Mobility  Bed Mobility Overal bed mobility: Needs Assistance Bed Mobility: Supine to Sit Rolling: Min assist         General bed mobility comments: Assist for trunk; fatigued from recent events  Transfers Overall transfer level: Needs assistance Equipment used: Rolling walker (2 wheeled);None Transfers: Sit to/from Stand Sit to Stand: Min guard;Min assist         General transfer comment: Cues to protect L posterior hip precautions. 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)       Balance           Standing balance support: Bilateral upper extremity supported Standing balance-Leahy Scale: Fair Standing balance comment: improved; tolerated stand exercises with LLE with Min guard                    Cognition Arousal/Alertness: Lethargic Behavior During Therapy: WFL for tasks assessed/performed Overall Cognitive Status: Within Functional Limits for tasks assessed       Memory: Decreased recall of precautions              Exercises Total Joint Exercises Hip ABduction/ADduction: AROM;Left;10 reps;Standing Straight Leg Raises: AROM;Left;10 reps;Standing Marching in Standing: AROM;Left;10 reps;Standing Standing Hip Extension: AROM;Left;Standing;5 reps    General Comments        Pertinent Vitals/Pain Pain Assessment:  (Does not quantify abdominal pain; denies in L hip currently)    Home Living                      Prior Function            PT Goals (current goals can now be found in the care plan section) Progress towards PT goals: Progressing toward goals (slowly)    Frequency  BID    PT Plan Current plan remains appropriate    Co-evaluation             End of Session   Activity Tolerance: Patient limited by fatigue Patient left:  in bed;with call bell/phone within reach;with bed alarm set;with family/visitor present     Time: 1281-1886 PT Time Calculation (min) (ACUTE ONLY): 16 min  Charges:  $Therapeutic Exercise: 8-22 mins                    G Codes:      Charlaine Dalton, PTA 05/08/2016, 11:40 AM

## 2016-05-08 NOTE — Progress Notes (Signed)
Physical Therapy Treatment Patient Details Name: Jeffrey Haynes MRN: [id number] DOB: 1925/10/19 Today's Date: 05/08/2016    History of Present Illness Normally active 82 y/o male who suffered a fall with L hip fx, needing replacement (posterior approach)    PT Comments    Pt continues very fatigued today. Pt notes he was up in chair for about an hour and a half this morning and does not wish to get up currently. Pt states he received more medication to move his bowels and is waiting for re attempt of NG tube. Pt agreeable to short session of exercises in the bed. Denies pain with exercises. Pt encouraged to perform exercises several times a day, to avoid losing strength. Continue PT as tolerated to progress movement and strength and reinforce posterior hip precautions to improve functional mobility.   Follow Up Recommendations  SNF     Equipment Recommendations  Rolling walker with 5" wheels    Recommendations for Other Services       Precautions / Restrictions Precautions Precautions: Fall;Posterior Hip Restrictions Weight Bearing Restrictions: Yes LLE Weight Bearing: Weight bearing as tolerated    Mobility  Bed Mobility Overal bed mobility: Needs Assistance Bed Mobility: Supine to Sit Rolling: Min assist         General bed mobility comments: Not tested; pt refuses out of bed and notes he was up in chair about an hour and a half in the morning  Transfers Overall transfer level: Needs assistance Equipment used: Rolling walker (2 wheeled);None Transfers: Sit to/from Stand Sit to Stand: Min guard;Min assist         General transfer comment: Cues to protect L posterior hip precautions. Poor control with decent  Ambulation/Gait         Gait velocity: Refused ambulation at this time       Financial trader [employer] (Stroke Patients Only)       Balance           Standing balance support: Bilateral upper extremity  supported Standing balance-Leahy Scale: Fair Standing balance comment: improved; tolerated stand exercises with LLE with Min guard                    Cognition Arousal/Alertness: Lethargic Behavior During Therapy: WFL for tasks assessed/performed Overall Cognitive Status: Within Functional Limits for tasks assessed       Memory: Decreased recall of precautions              Exercises Total Joint Exercises Ankle Circles/Pumps: AROM;Both;10 reps;Supine Quad Sets: Strengthening;Both;10 reps;Supine Gluteal Sets: Strengthening;Both;10 reps;Supine Short Arc Quad: AROM;Both;10 reps;Supine Heel Slides: AAROM;Left;10 reps;Supine (AROM R) Hip ABduction/ADduction: Left;10 reps;AAROM;Supine (AROM R) Straight Leg Raises: 10 reps;AAROM;Both;Supine Marching in Standing: AROM;Left;10 reps;Standing Standing Hip Extension: AROM;Left;Standing;5 reps    General Comments        Pertinent Vitals/Pain Pain Assessment: No/denies pain    Home Living                      Prior Function            PT Goals (current goals can now be found in the care plan section) Progress towards PT goals: Progressing toward goals (slowly)    Frequency  BID    PT Plan Current plan remains appropriate    Co-evaluation             End of Session  Activity Tolerance: Patient limited by fatigue Patient left: in bed;with call bell/phone within reach;with bed alarm set     Time: 4327-6147 PT Time Calculation (min) (ACUTE ONLY): 17 min  Charges:  $Therapeutic Exercise: 8-22 mins                    G Codes:      Charlaine Dalton, PTA 05/08/2016, 2:21 PM

## 2016-05-08 NOTE — Progress Notes (Signed)
Pt refused NGT to be replaced after failed X3  attempts by pervious nurse. Dr Jannifer Franklin notified, stated attending will reeval tomorrow morning.

## 2016-05-08 NOTE — Progress Notes (Signed)
SSE GIVEN 1000CC. TOLERATED WELL. MODERATE AMOUNT LOOSE BROWN STOOL NOTED. LESS DISTENTION NOTED

## 2016-05-08 NOTE — Progress Notes (Signed)
KUB SHOWS PERSISTENT COLONIC ILEUS.# 14 FRENCH  NGT PLACED RIGHT NARE AT 73CM. PLACEMENT VERIFIED BY AUSCULTATION. RE- VERIFIED BY ERICA JOHNSON,RN DRAINAGE MEDIUM BROWN. PT TOLERATED  WELL

## 2016-05-08 NOTE — Telephone Encounter (Signed)
Patient called to let Dr. Jeananne Rama the reason he was not able to come to his doctor appointment yesterday was because he fell on Friday and has been in the hospital since then. He broke his hip.

## 2016-05-08 NOTE — Progress Notes (Signed)
[place] [facility] [place] NAME: Jeffrey Haynes    MR#:  [id number]  DATE OF BIRTH:  1925-03-17  SUBJECTIVE:  CHIEF COMPLAINT:   Chief Complaint  Patient presents with  . Fall  Patient is 82 year old Caucasian male with medical history significant for the history of COPD, coronary artery disease, hyperlipidemia, familial tremor, peptic ulcer disease who presents to the hospital with complaints of fall after he slipped on the tile floor. Patient was brought to emergency room in he was found to have left femur fracture. He underwent left hemiarthroplasty on 05/03/2016, Some complains of left hip pain, but overall feels comfortable. The patient was noted to have acute COPD exacerbation ,  developed A. fib RVR and was transported to ICU for amiodarone infusion. He converted back to sinus rhythm, heart rate in 80s.  Patient feels poorly today, Still, complains of abdominal distention, however, better than yesterday. Since Patient had a medium bowel movement today.  NG tube was not placed due to patient refusing it. After numerous attempts    Review of Systems  Constitutional: Negative for fever, chills and weight loss.  HENT: Negative for congestion.   Eyes: Negative for blurred vision and double vision.  Respiratory: Negative for cough, sputum production, shortness of breath and wheezing.   Cardiovascular: Negative for chest pain, palpitations, orthopnea, leg swelling and PND.  Gastrointestinal: Negative for nausea, vomiting, abdominal pain, diarrhea, constipation and blood in stool.  Genitourinary: Negative for dysuria, urgency, frequency and hematuria.  Musculoskeletal: Positive for joint pain. Negative for falls.  Neurological: Negative for dizziness, tremors, focal weakness and headaches.  Endo/Heme/Allergies: Does not bruise/bleed easily.  Psychiatric/Behavioral: Negative for depression. The patient does not have insomnia.     VITAL SIGNS:  Blood pressure 101/46, pulse 77, temperature 97.8 F (36.6 C), temperature source Oral, resp. rate 18, height '5\' 9"'$  (1.753 m), weight 90.7 kg (199 lb 15.3 oz), SpO2 99 %.  PHYSICAL EXAMINATION:   GENERAL:  82 y.o.-year-old patient lying in the bed with no acute distress.  EYES: Pupils equal, round, reactive to light and accommodation. No scleral icterus. Extraocular muscles intact.  HEENT: Head atraumatic, normocephalic. Oropharynx and nasopharynx clear.  NECK:  Supple, no jugular venous distention. No thyroid enlargement, no tenderness.  LUNGS: Better air entrance bilaterally , no wheezing, right-sided rales,rhonchi , crepitations noted on the auscultation posteriorly. Intermittent use of accessory muscles of respiration, especially moving around.  CARDIOVASCULAR: S1, S2 is regular. No murmurs, rubs, or gallops.  ABDOMEN: Mildly firm, mild discomfort diffusely on palpation mostly in the mid parts of abdomen bilaterally, no rebound or guarding , less distended today. Bowel sounds improved, diminished. No organomegaly or mass.  EXTREMITIES: No pedal edema, cyanosis, or clubbing. Left hip dressing is intact. No bleeding, swelling or pain on palpation NEUROLOGIC: Cranial nerves II through XII are intact. Muscle strength 5/5 in all extremities. Sensation intact. Gait not checked.  PSYCHIATRIC: The patient is alert and oriented x 3.  SKIN: No obvious rash, lesion, or ulcer.   ORDERS/RESULTS REVIEWED:   CBC  Recent Labs Lab 05/02/16 1543 05/03/16 0453 05/05/16 0322  WBC 11.0* 9.6  --   HGB 14.1 12.3* 12.1*  HCT 41.6 36.9*  --   PLT 186 161  --   MCV 94.4 97.3  --   MCH 32.0 32.5  --   MCHC 34.0 33.4  --   RDW 14.1 13.5  --   LYMPHSABS 1.8 1.2  --   MONOABS  0.8 0.7  --   EOSABS 0.1 0.1  --   BASOSABS 0.0 0.0  --    ------------------------------------------------------------------------------------------------------------------  Chemistries   Recent Labs Lab 05/02/16 1543  05/03/16 0453 05/04/16 0509 05/05/16 0322 05/06/16 0349 05/07/16 0606 05/08/16 0640  NA 137 138 134* 134* 132* 134*  --   K 5.2* 4.1 4.2 4.0  --  4.0  --   CL 106 109 102 105  --   --   --   CO2 '24 25 27 25  '$ --   --   --   GLUCOSE 98 103* 110* 136*  --   --   --   BUN '15 13 11 12  '$ --   --   --   CREATININE 0.95 0.91 1.11 0.93  --   --  0.93  CALCIUM 9.0 7.9* 8.1* 7.8*  --   --   --   AST 25  --   --   --   --   --   --   ALT 11*  --   --   --   --   --   --   ALKPHOS 80  --   --   --   --   --   --   BILITOT 1.5*  --   --   --   --   --   --    ------------------------------------------------------------------------------------------------------------------ estimated creatinine clearance is 61.1 mL/min (by C-G formula based on Cr of 0.93). ------------------------------------------------------------------------------------------------------------------  Recent Labs  05/06/16 0349  TSH 1.610    Cardiac Enzymes  Recent Labs Lab 05/04/16 2055  TROPONINI <0.03   ------------------------------------------------------------------------------------------------------------------ Invalid input(s): POCBNP ---------------------------------------------------------------------------------------------------------------  RADIOLOGY: Dg Abd 1 View  05/07/2016  CLINICAL DATA:  Evaluate NG tube placement. EXAM: ABDOMEN - 1 VIEW COMPARISON:  Film from earlier same night FINDINGS: The side port of the NG tube terminates above the GE junction. Recommend advancement. Air-filled prominent loops of colon again identified. No other interval changes. IMPRESSION: The side port of the NG tube is above the GE junction. Recommend advancement. Electronically Signed   By: Dorise Bullion III M.D   On: 05/07/2016 19:57   Dg Abd 1 View  05/07/2016  CLINICAL DATA:  Evaluate NG tube placement EXAM: ABDOMEN - 1 VIEW COMPARISON:  None. FINDINGS: The side port of the NG tube is at or just above the GE junction.  Recommend advancing the tube. Air-filled dilated colon is again seen. IMPRESSION: The side port of the NG tube is probably just above the GE junction. Recommend advancement. Persistent air-filled dilated colon. Electronically Signed   By: Dorise Bullion III M.D   On: 05/07/2016 18:39   Dg Abd 2 Views  05/07/2016  CLINICAL DATA:  Postop ileus. EXAM: ABDOMEN - 2 VIEW COMPARISON:  05/06/2016 and 05/02/2016 FINDINGS: Examination demonstrates air-filled mildly prominent colonic loops without significant change. There is moderate fecal retention over the right colon. Air is present over the rectosigmoid colon. There is no free peritoneal air. Evidence of cholelithiasis. Several nonspecific calcifications over the right lower quadrant unchanged. Several pelvic phleboliths. There are moderate degenerate changes of the spine. Left hip arthroplasty intact. IMPRESSION: Stable air-filled mildly dilated colon likely postop ileus. Moderate cholelithiasis. Electronically Signed   By: Marin Olp M.D.   On: 05/07/2016 08:37   Dg Abd Portable 1v  05/07/2016  CLINICAL DATA:  Evaluate NG tube placement EXAM: PORTABLE ABDOMEN - 1 VIEW COMPARISON:  Abdominal film from earlier today FINDINGS: The distal  tip of the NG tube is now within the distal esophagus and the side-port is in the midesophagus at the level of the carina. No other changes. IMPRESSION: The side port of the NG tube has been pulled back, terminating in the mid esophagus. Recommend advancement. No other changes. Electronically Signed   By: Dorise Bullion III M.D   On: 05/07/2016 22:35    EKG:  Orders placed or performed during the hospital encounter of 05/02/16  . ED EKG  . ED EKG  . EKG 12-Lead  . EKG 12-Lead  . EKG 12-Lead  . EKG 12-Lead    ASSESSMENT AND PLAN:  Principal Problem:   Femur fracture (HCC) Active Problems:   COPD (chronic obstructive pulmonary disease) ([place])   New onset atrial fibrillation (HCC)   Respiratory distress   Fall    Status post hip hemiarthroplasty #1. Mildly displaced left femoral neck fracture, status post left hip arthroplasty on 05/03/2016 by Dr. Roland Rack, continue pain control, adequate, continue anticoagulation with Lovenox at 40 mg subcutaneously once a day, patient Will need to go to skilled nursing facility for rehabilitation, per physical therapist recommendations #2. Acute posthemorrhagic anemia, about 2 g hemoglobin drop after surgery, recheck in the morning #3. Leukocytosis, likely reactive, resolved #4. History of coronary artery disease, stable, continue outpatient medications #5. Hyperkalemia, resolved #6. Ileus, postoperative, continue IV fluids, Reglan, supportive therapy, unable to place NG due to patient refusing, patient was given an enema, continue suppositories, recheck abdominal x-ray later today and in the morning #7. Constipation,  enema yesterday, suppository today with moderate output , follow patient's condition clinically #8 . COPD with exacerbation, likely due to aspirations, improved significantly, continue  DuoNeb's, budesonide , following closely for recurrent aspirations #9 A. fib RVR, converted to sinus rhythm on amiodarone intravenously, continue patient on amiodarone orally, , cardiology consultation is appreciated, echocardiogram was unremarkable, no need for anticoagulation due to short-lived atrial fibrillation, per cardiologist  Management plans discussed with the patient, family and they are in agreement.   DRUG ALLERGIES: No Known Allergies  CODE STATUS:     Code Status Orders        Start     Ordered   05/03/16 0015  Full code   Continuous     05/03/16 0014    Code Status History    Date Active Date Inactive Code Status Order ID Comments User Context   This patient has a current code status but no historical code status.    Advance Directive Documentation        Most Recent Value   Type of Advance Directive  Healthcare Power of Attorney   Pre-existing  out of facility DNR order (yellow form or pink MOST form)     "MOST" Form in Place?        TOTAL TIME TAKING CARE OF THIS PATIENT: 40 minutes.  Discussed with patient's son Theodoro Grist M.D on 05/08/2016 at 3:26 PM  Between 7am to 6pm - Pager - [phone]  After 6pm go to [email or web address] - password [email or web address] [facility]  Office  [phone]  CC: Primary care physician; Golden Pop, MD

## 2016-05-08 NOTE — Progress Notes (Signed)
Subjective: 6 Days Post-Op Procedure(s) (LRB): ARTHROPLASTY BIPOLAR HIP (HEMIARTHROPLASTY) (Left) Patient reports pain as moderate.   Patient is well, and has had no acute complaints or problems. Pt suffering from post-op ileus. Denies any CP or stomach pain.  SOB improved. We will continue therapy today.  Plan is to go Rehab after hospital stay.  Objective: Vital signs in last 24 hours: Temp:  [98.2 F (36.8 C)-98.6 F (37 C)] 98.2 F (36.8 C) (06/01 0748) Pulse Rate:  [79-94] 81 (06/01 0748) Resp:  [18-20] 18 (06/01 0748) BP: (118-139)/(47-61) 118/47 mmHg (06/01 0748) SpO2:  [89 %-97 %] 92 % (06/01 0748)  Intake/Output from previous day: 05/31 0701 - 06/01 0700 In: 2550 [I.V.:2550] Out: 925 [Urine:925] Intake/Output this shift:    No results for input(s): HGB in the last 72 hours. No results for input(s): WBC, RBC, HCT, PLT in the last 72 hours.  Recent Labs  05/06/16 0349 05/07/16 0606 05/08/16 0640  NA 132* 134*  --   K  --  4.0  --   CREATININE  --   --  0.93   No results for input(s): LABPT, INR in the last 72 hours.  EXAM General - Patient is Alert, Appropriate and Oriented Extremity - Neurovascular intact Sensation intact distally Intact pulses distally Dorsiflexion/Plantar flexion intact Dressing - moderate drainage Motor Function - intact, moving foot and toes well on exam.   Abdomen distended with decreased bowel sounds.  Positive tympany throughout the abdomen. Abdomen appears less distended this AM, bowel sounds improved.  Past Medical History  Diagnosis Date  . COPD (chronic obstructive pulmonary disease) ([place])   . CAD (coronary artery disease)   . Hyperlipidemia   . Familial tremor   . Bleeding ulcer 09/2012    a. gastric  . Orthostatic hypotension     a. leading to prior syncope  . Hypothyroidism   . Essential hypertension   . PAF (paroxysmal atrial fibrillation) (HCC)     a. in the post-op setting and associated with COPD exacerbation     Assessment/Plan:   6 Days Post-Op Procedure(s) (LRB): ARTHROPLASTY BIPOLAR HIP (HEMIARTHROPLASTY) (Left) Principal Problem:   Femur fracture (HCC) Active Problems:   COPD (chronic obstructive pulmonary disease) ([place])   New onset atrial fibrillation (HCC)   Respiratory distress   Fall   Status post hip hemiarthroplasty  Estimated body mass index is 29.52 kg/(m^2) as calculated from the following:   Height as of this encounter: '5\' 9"'$  (1.753 m).   Weight as of this encounter: 90.7 kg (199 lb 15.3 oz). Advance diet Up with therapy  Plan on discharge to SNF once medically cleared Remove staples and apply steri strips 04/15/16. Follow up with [facility] in 6 weeks.  Post-op Ileus. KUB reveals air-filled mildly dilated colon likely postop ileus.  K+ 4.0. Attempted NG tube yesterday 3x without success, internal med to re-evaluate today. According to nurses notes, patient has had a bowel movement since yesterday. Continue to get up with therapy, may help with bowel distention.  DVT Prophylaxis - Lovenox, Foot Pumps and TED hose Weight-Bearing as tolerated to left leg D/C O2 and Pulse OX and try on [place] PA-C [place] 05/08/2016, 7:53 AM

## 2016-05-08 NOTE — Progress Notes (Addendum)
SSE GIVEN .1000CC. NO RESULTS. RN SPOKE WITH DR Ether Griffins. MD REPORTS IF KUB UNCHANGED. INSERT NGT. REPEAT KUB IN AM

## 2016-05-09 ENCOUNTER — Inpatient Hospital Stay: Payer: Medicare Other

## 2016-05-09 LAB — CREATININE, SERUM: Creatinine, Ser: 0.87 mg/dL (ref 0.61–1.24)

## 2016-05-09 LAB — HEMOGLOBIN: HEMOGLOBIN: 10.2 g/dL — AB (ref 13.0–18.0)

## 2016-05-09 MED ORDER — PEG 3350-KCL-NA BICARB-NACL 420 G PO SOLR
200.0000 mL | Freq: Two times a day (BID) | ORAL | Status: DC
  Administered 2016-05-09 – 2016-05-11 (×4): 200 mL
  Filled 2016-05-09: qty 4000

## 2016-05-09 MED ORDER — ALBUTEROL SULFATE (2.5 MG/3ML) 0.083% IN NEBU: 2.5000 mg | INHALATION_SOLUTION | Freq: Four times a day (QID) | RESPIRATORY_TRACT | Status: DC | PRN

## 2016-05-09 MED ORDER — ALBUTEROL SULFATE (2.5 MG/3ML) 0.083% IN NEBU
3.0000 mL | INHALATION_SOLUTION | Freq: Three times a day (TID) | RESPIRATORY_TRACT | Status: DC
  Administered 2016-05-09 – 2016-05-13 (×12): 3 mL via RESPIRATORY_TRACT
  Filled 2016-05-09 (×12): qty 3

## 2016-05-09 MED ORDER — CEPASTAT 14.5 MG MT LOZG
1.0000 | LOZENGE | OROMUCOSAL | Status: DC | PRN
Start: 2016-05-09 — End: 2016-05-13
  Filled 2016-05-09: qty 9

## 2016-05-09 MED ORDER — PANTOPRAZOLE SODIUM 40 MG IV SOLR: 40.0000 mg | Freq: Two times a day (BID) | INTRAVENOUS | Status: DC

## 2016-05-09 NOTE — Progress Notes (Addendum)
[facility] [place] NAME: Jeffrey Haynes    MR#:  [id number]  DATE OF BIRTH:  [birth year]  SUBJECTIVE:  CHIEF COMPLAINT:   Chief Complaint  Patient presents with  . Fall  Patient is 82 year old Caucasian male with medical history significant for the history of COPD, coronary artery disease, hyperlipidemia, familial tremor, peptic ulcer disease who presents to the hospital with complaints of fall after he slipped on the tile floor. Patient was brought to emergency room in he was found to have left femur fracture. He underwent left hemiarthroplasty on 05/03/2016, Some complains of left hip pain, but overall feels comfortable. The patient was noted to have acute COPD exacerbation ,  developed A. fib RVR and was transported to ICU for amiodarone infusion. He converted back to sinus rhythm, heart rate in 80s.  Patient Still feels uncomfortable today, however, patient's abdominal distention has resolved after NG tube placement. Section about 200 cc of bloody gastric content most recent abdominal x-ray revealed gas throughout the colon, large amount of stool in the right colon. The patient did not have a good bowel movement so far, discussed with patient's son extensively    Review of Systems  Constitutional: Negative for fever, chills and weight loss.  HENT: Negative for congestion.   Eyes: Negative for blurred vision and double vision.  Respiratory: Negative for cough, sputum production, shortness of breath and wheezing.   Cardiovascular: Negative for chest pain, palpitations, orthopnea, leg swelling and PND.  Gastrointestinal: Negative for nausea, vomiting, abdominal pain, diarrhea, constipation and blood in stool.  Genitourinary: Negative for dysuria, urgency, frequency and hematuria.  Musculoskeletal: Positive for joint pain. Negative for falls.  Neurological: Negative for dizziness, tremors, focal weakness and headaches.  Endo/Heme/Allergies:  Does not bruise/bleed easily.  Psychiatric/Behavioral: Negative for depression. The patient does not have insomnia.     VITAL SIGNS: Blood pressure 145/59, pulse 81, temperature 98.5 F (36.9 C), temperature source Axillary, resp. rate 20, height '5\' 9"'$  (1.753 m), weight 90.7 kg (199 lb 15.3 oz), SpO2 100 %.  PHYSICAL EXAMINATION:   GENERAL:  82 y.o.-year-old patient lying in the bed with no acute distress.  EYES: Pupils equal, round, reactive to light and accommodation. No scleral icterus. Extraocular muscles intact.  HEENT: Head atraumatic, normocephalic. Oropharynx and nasopharynx clear.  NECK:  Supple, no jugular venous distention. No thyroid enlargement, no tenderness.  LUNGS: Better air entrance bilaterally , no wheezing, right-sided rales,rhonchi , crepitations noted on the auscultation posteriorly. Intermittent use of accessory muscles of respiration, especially moving around.  CARDIOVASCULAR: S1, S2 is regular. No murmurs, rubs, or gallops.  ABDOMEN: Soft. No significant tenderness diffusely, no rebound or guarding , less distended today. Bowel sounds diminished. No organomegaly or mass.  EXTREMITIES: No pedal edema, cyanosis, or clubbing. Left hip dressing is intact. No bleeding, swelling or pain on palpation NEUROLOGIC: Cranial nerves II through XII are intact. Muscle strength 5/5 in all extremities. Sensation intact. Gait not checked.  PSYCHIATRIC: The patient is alert and oriented x 3.  SKIN: No obvious rash, lesion, or ulcer.   ORDERS/RESULTS REVIEWED:   CBC  Recent Labs Lab 05/03/16 0453 05/05/16 0322  WBC 9.6  --   HGB 12.3* 12.1*  HCT 36.9*  --   PLT 161  --   MCV 97.3  --   MCH 32.5  --   MCHC 33.4  --   RDW 13.5  --   LYMPHSABS 1.2  --   MONOABS 0.7  --  EOSABS 0.1  --   BASOSABS 0.0  --    ------------------------------------------------------------------------------------------------------------------  Chemistries   Recent Labs Lab 05/03/16 0453  05/04/16 0509 05/05/16 0322 05/06/16 0349 05/07/16 0606 05/08/16 0640 05/09/16 0446  NA 138 134* 134* 132* 134*  --   --   K 4.1 4.2 4.0  --  4.0  --   --   CL 109 102 105  --   --   --   --   CO2 '25 27 25  '$ --   --   --   --   GLUCOSE 103* 110* 136*  --   --   --   --   BUN '13 11 12  '$ --   --   --   --   CREATININE 0.91 1.11 0.93  --   --  0.93 0.87  CALCIUM 7.9* 8.1* 7.8*  --   --   --   --    ------------------------------------------------------------------------------------------------------------------ estimated creatinine clearance is 64.1 mL/min (by C-G formula based on Cr of 0.87). ------------------------------------------------------------------------------------------------------------------ No results for input(s): TSH, T4TOTAL, T3FREE, THYROIDAB in the last 72 hours.  Invalid input(s): FREET3  Cardiac Enzymes  Recent Labs Lab 05/04/16 2055  TROPONINI <0.03   ------------------------------------------------------------------------------------------------------------------ Invalid input(s): POCBNP ---------------------------------------------------------------------------------------------------------------  RADIOLOGY: Dg Abd 1 View  05/09/2016  CLINICAL DATA:  Difficulty passing stool. Postoperative ileus. Recent left hip surgery. EXAM: ABDOMEN - 1 VIEW COMPARISON:  05/08/2016 FINDINGS: Nasogastric tube in distal stomach region. Evidence for multiple calcified gallstones. There is a large amount of gas throughout the colon. There is also gas in the rectal region. There is a left hip arthroplasty. Severe degenerative endplate and disc changes in the lumbar spine. Moderate amount of stool in the right colon. Limited evaluation for free air on these supine images. IMPRESSION: Persistent gas throughout the colon and similar to the previous examination. Findings could be associated with a colonic ileus pattern. Electronically Signed   By: Markus Daft M.D.   On: 05/09/2016 08:40    Dg Abd 1 View  05/08/2016  CLINICAL DATA:  Colonic ileus after surgery. EXAM: ABDOMEN - 1 VIEW COMPARISON:  May 07, 2016 FINDINGS: Air-filled dilated loops of colon persist with a tiny amount of air in the rectum. Calcifications in the right upper quadrant consistent with cholelithiasis. No other interval changes. No free air, portal venous gas, or pneumatosis seen on supine imaging. IMPRESSION: Persistent colonic ileus, similar in the interval. Electronically Signed   By: Dorise Bullion III M.D   On: 05/08/2016 16:08   Dg Abd 1 View  05/07/2016  CLINICAL DATA:  Evaluate NG tube placement. EXAM: ABDOMEN - 1 VIEW COMPARISON:  Film from earlier same night FINDINGS: The side port of the NG tube terminates above the GE junction. Recommend advancement. Air-filled prominent loops of colon again identified. No other interval changes. IMPRESSION: The side port of the NG tube is above the GE junction. Recommend advancement. Electronically Signed   By: Dorise Bullion III M.D   On: 05/07/2016 19:57   Dg Abd 1 View  05/07/2016  CLINICAL DATA:  Evaluate NG tube placement EXAM: ABDOMEN - 1 VIEW COMPARISON:  None. FINDINGS: The side port of the NG tube is at or just above the GE junction. Recommend advancing the tube. Air-filled dilated colon is again seen. IMPRESSION: The side port of the NG tube is probably just above the GE junction. Recommend advancement. Persistent air-filled dilated colon. Electronically Signed   By: Dorise Bullion III M.D  On: 05/07/2016 18:39   Dg Abd Portable 1v  05/07/2016  CLINICAL DATA:  Evaluate NG tube placement EXAM: PORTABLE ABDOMEN - 1 VIEW COMPARISON:  Abdominal film from earlier today FINDINGS: The distal tip of the NG tube is now within the distal esophagus and the side-port is in the midesophagus at the level of the carina. No other changes. IMPRESSION: The side port of the NG tube has been pulled back, terminating in the mid esophagus. Recommend advancement. No other changes.  Electronically Signed   By: Dorise Bullion III M.D   On: 05/07/2016 22:35    EKG:  Orders placed or performed during the hospital encounter of 05/02/16  . ED EKG  . ED EKG  . EKG 12-Lead  . EKG 12-Lead  . EKG 12-Lead  . EKG 12-Lead    ASSESSMENT AND PLAN:  Principal Problem:   Femur fracture (HCC) Active Problems:   COPD (chronic obstructive pulmonary disease) ([place])   New onset atrial fibrillation (HCC)   Respiratory distress   Fall   Status post hip hemiarthroplasty #1. Mildly displaced left femoral neck fracture, status post left hip arthroplasty on 05/03/2016 by Dr. Roland Rack, continue pain control, adequate, continue anticoagulation with Lovenox at 40 mg subcutaneously once a day, patient Will need to go to skilled nursing facility for rehabilitation, per physical therapist recommendations #2. Acute posthemorrhagic anemia, about 2 g hemoglobin drop after surgery, worse today with NGT suctioning bloody gastric content,  Traumatic? , initiate Protonix IV bid, follow Hgb in am, transfuse as needed. #3. Leukocytosis, likely reactive, resolved #4. History of coronary artery disease, stable, continue outpatient medications #5. Hyperkalemia, resolved #6. Ileus, postoperative, continue IV fluids, Reglan, supportive therapy, initially we were unable to place NG due to patient refusing, patient was given an enema, suppositories, however, no significant improvement on KUB, initiate patient on MiraLAX, clamp NG tube and try ice chips for comfort, following for stool.  #7. Constipation,  enema , suppository with moderate output , patient's condition clinically improved somewhat, but remains nothing by mouth, try ice chips and follow clinically #8 . COPD with exacerbation, suspected due to aspirations, improved significantly, continue  DuoNeb's, budesonide , following closely for recurrent aspirations #9 A. fib RVR, converted to sinus rhythm on amiodarone intravenously, continue patient on  amiodarone orally, , cardiology consultation is appreciated, echocardiogram was normal, no need for anticoagulation due to short-lived atrial fibrillation, per cardiologist #10 hematemesis as suctioned via NGT, start Protonix IV bid, follow Hgb  In am  Management plans discussed with the patient, family and they are in agreement.   DRUG ALLERGIES: No Known Allergies  CODE STATUS:     Code Status Orders        Start     Ordered   05/03/16 0015  Full code   Continuous     05/03/16 0014    Code Status History    Date Active Date Inactive Code Status Order ID Comments User Context   This patient has a current code status but no historical code status.    Advance Directive Documentation        Most Recent Value   Type of Advance Directive  Healthcare Power of Attorney   Pre-existing out of facility DNR order (yellow form or pink MOST form)     "MOST" Form in Place?        TOTAL TIME TAKING CARE OF THIS PATIENT: 40 minutes.  Discussed with patient's son Theodoro Grist M.D on 05/09/2016 at 3:47 PM  Between  7am to 6pm - Pager - [phone]  After 6pm go to [email or web address] - password [email or web address]  Office  [phone]  CC: Primary care physician; Golden Pop, MD

## 2016-05-09 NOTE — Progress Notes (Signed)
Physical Therapy Treatment Patient Details Name: Jeffrey Haynes MRN: [id number] DOB: 09/23/1925 Today's Date: 05/09/2016    History of Present Illness Normally active 82 y/o male who suffered a fall with L hip fx, needing replacement (posterior approach)    PT Comments    Pt in chair with NG clamped, willing to try to ambulate.  Seated AROM for RLE as described below.  Stood with min guard +1 and was able to ambulate 60' with walker and min guard x 1.  Limited by fatigue but overall did well with ambulation. Pt pleased with his progress today but voiced frustrations regarding ongoing illness.   Follow Up Recommendations  SNF     Equipment Recommendations  Rolling walker with 5" wheels    Recommendations for Other Services       Precautions / Restrictions Precautions Precautions: Fall;Posterior Hip Restrictions Weight Bearing Restrictions: Yes LLE Weight Bearing: Weight bearing as tolerated    Mobility  Bed Mobility Overal bed mobility: Needs Assistance Bed Mobility: Supine to Sit Rolling: Min assist   Supine to sit: Modified independent (Device/Increase time) Sit to supine: Min assist   General bed mobility comments: uses rail  Transfers Overall transfer level: Needs assistance Equipment used: Rolling walker (2 wheeled);None Transfers: Sit to/from Stand Sit to Stand: Min assist Stand pivot transfers: Min assist          Ambulation/Gait Ambulation/Gait assistance: Min assist;+2 safety/equipment Ambulation Distance (Feet): 60 Feet Assistive device: Rolling walker (2 wheeled) Gait Pattern/deviations: Step-to pattern Gait velocity: decreased Gait velocity interpretation: <1.8 ft/sec, indicative of risk for recurrent falls General Gait Details:  (generally steady but slow gait with dec step length bilatera)   Stairs            Wheelchair Mobility    Modified Rankin (Stroke Patients Only)       Balance Overall balance assessment: Needs  assistance Sitting-balance support: Feet supported Sitting balance-Leahy Scale: Fair     Standing balance support: Bilateral upper extremity supported Standing balance-Leahy Scale: Fair                      Cognition Arousal/Alertness: Awake/alert Behavior During Therapy: WFL for tasks assessed/performed Overall Cognitive Status: Within Functional Limits for tasks assessed       Memory: Decreased recall of precautions              Exercises Total Joint Exercises Ankle Circles/Pumps: AROM;Both;10 reps;Supine Quad Sets: Strengthening;Both;10 reps;Supine Gluteal Sets: Strengthening;Both;10 reps;Supine Towel Squeeze: Strengthening;Both;10 reps;Supine Short Arc Quad: AROM;Both;10 reps;Supine Heel Slides: AAROM;Left;10 reps;Supine Hip ABduction/ADduction: Left;10 reps;AAROM;Supine Straight Leg Raises: 10 reps;AAROM;Both;Supine Long Arc Quad: AROM;Left;10 reps;Seated Other Exercises Other Exercises: seated AROM for RLE x 20 for LAQ, ankle pumps, AB/Adduction    General Comments        Pertinent Vitals/Pain Pain Assessment: No/denies pain Pain Location: L hip    Home Living                      Prior Function            PT Goals (current goals can now be found in the care plan section) Progress towards PT goals: Progressing toward goals    Frequency  BID    PT Plan Current plan remains appropriate    Co-evaluation             End of Session Equipment Utilized During Treatment: Gait belt Activity Tolerance: Patient limited by fatigue Patient left: in chair;with call  bell/phone within reach;with chair alarm set;Other (comment)     Time: 7654-6503 PT Time Calculation (min) (ACUTE ONLY): 22 min  Charges:  $Gait Training: 8-22 mins $Therapeutic Exercise: 8-22 mins $Therapeutic Activity: 8-22 mins                    G Codes:      Chesley Noon, PTA 05/09/2016, 11:36 AM

## 2016-05-09 NOTE — Progress Notes (Signed)
Physical Therapy Treatment Patient Details Name: Jeffrey Haynes MRN: [id number] DOB: 1925/08/03 Today's Date: 05/09/2016    History of Present Illness Normally active 82 y/o male who suffered a fall with L hip fx, needing replacement (posterior approach)    PT Comments    Pt resting in bed upon arrival.  Completed exercises as described below.  OOB and transferred to chair with min guard x 1.  Pt becoming SOB during mobility skills.  O2 and HR within limits.  PT reported feeling "stuffy" due to NG tube.  Respiratory Therapy in for breathing treatment.  Will attempt ambulation later his morning.   Follow Up Recommendations  SNF     Equipment Recommendations  Rolling walker with 5" wheels    Recommendations for Other Services       Precautions / Restrictions Precautions Precautions: Fall;Posterior Hip Restrictions Weight Bearing Restrictions: Yes LLE Weight Bearing: Weight bearing as tolerated    Mobility  Bed Mobility Overal bed mobility: Needs Assistance Bed Mobility: Supine to Sit Rolling: Min assist   Supine to sit: Modified independent (Device/Increase time) Sit to supine: Min assist   General bed mobility comments: uses rail  Transfers Overall transfer level: Needs assistance Equipment used: Rolling walker (2 wheeled);None Transfers: Sit to/from Stand Sit to Stand: Min assist Stand pivot transfers: Min assist          Ambulation/Gait Ambulation/Gait assistance: Min assist Ambulation Distance (Feet): 3 Feet Assistive device: Rolling walker (2 wheeled) Gait Pattern/deviations: Step-to pattern Gait velocity: decreased Gait velocity interpretation: <1.8 ft/sec, indicative of risk for recurrent falls General Gait Details: steady steps to Airline pilot Rankin (Stroke Patients Only)       Balance Overall balance assessment: Needs assistance Sitting-balance support: Feet supported Sitting balance-Leahy  Scale: Fair     Standing balance support: Bilateral upper extremity supported Standing balance-Leahy Scale: Fair                      Cognition Arousal/Alertness: Awake/alert Behavior During Therapy: WFL for tasks assessed/performed Overall Cognitive Status: Within Functional Limits for tasks assessed       Memory: Decreased recall of precautions              Exercises Total Joint Exercises Ankle Circles/Pumps: AROM;Both;10 reps;Supine Quad Sets: Strengthening;Both;10 reps;Supine Gluteal Sets: Strengthening;Both;10 reps;Supine Towel Squeeze: Strengthening;Both;10 reps;Supine Short Arc Quad: AROM;Both;10 reps;Supine Heel Slides: AAROM;Left;10 reps;Supine Hip ABduction/ADduction: Left;10 reps;AAROM;Supine Straight Leg Raises: 10 reps;AAROM;Both;Supine Long Arc Quad: AROM;Left;10 reps;Seated    General Comments        Pertinent Vitals/Pain Pain Assessment: No/denies pain Pain Location: L hip    Home Living                      Prior Function            PT Goals (current goals can now be found in the care plan section) Progress towards PT goals: Progressing toward goals    Frequency  BID    PT Plan Current plan remains appropriate    Co-evaluation             End of Session Equipment Utilized During Treatment: Gait belt Activity Tolerance: Patient limited by fatigue Patient left: in chair;with call bell/phone within reach;with chair alarm set;Other (comment) (respiratory therapy)     Time: 6073-7106 PT Time Calculation (min) (ACUTE ONLY): 23 min  Charges:  $Therapeutic Exercise: 8-22 mins $Therapeutic Activity: 8-22 mins                    G Codes:      Chesley Noon, PTA 05/09/2016, 10:25 AM

## 2016-05-09 NOTE — Care Management Important Message (Signed)
Important Message  Patient Details  Name: Jeffrey Haynes MRN: [id number] Date of Birth: [birth year]   Medicare Important Message Given:  Yes    Marshell Garfinkel, RN 05/09/2016, 11:37 AM

## 2016-05-09 NOTE — Care Management (Signed)
Patient still NPO for illeus. POD 7 today of left hip arthroplasty. NG placed 05/08/16.

## 2016-05-09 NOTE — Progress Notes (Signed)
AM meds given to patient, NG tube clamped. Will unclamp in 1-2 hours. Continue to monitor.

## 2016-05-09 NOTE — Progress Notes (Signed)
Subjective: 7 Days Post-Op Procedure(s) (LRB): ARTHROPLASTY BIPOLAR HIP (HEMIARTHROPLASTY) (Left) Patient reports pain as moderate.   Patient is well, and has had no acute complaints or problems. Pt suffering from post-op ileus. Denies any CP or stomach pain.  SOB improved. We will continue therapy today.  Plan is to go Rehab after hospital stay.  Objective: Vital signs in last 24 hours: Temp:  [97.8 F (36.6 C)-98.7 F (37.1 C)] 98.7 F (37.1 C) (06/02 0743) Pulse Rate:  [77-87] 80 (06/02 0743) Resp:  [18-24] 19 (06/02 0743) BP: (101-126)/(46-55) 126/46 mmHg (06/02 0743) SpO2:  [90 %-99 %] 94 % (06/02 0743)  Intake/Output from previous day: 06/01 0701 - 06/02 0700 In: 2371.7 [I.V.:2371.7] Out: 201 [Urine:1; Emesis/NG output:200] Intake/Output this shift:    No results for input(s): HGB in the last 72 hours. No results for input(s): WBC, RBC, HCT, PLT in the last 72 hours.  Recent Labs  05/07/16 0606 05/08/16 0640 05/09/16 0446  NA 134*  --   --   K 4.0  --   --   CREATININE  --  0.93 0.87   No results for input(s): LABPT, INR in the last 72 hours.  EXAM General - Patient is Alert, Appropriate and Oriented Extremity - Neurovascular intact Sensation intact distally Intact pulses distally Dorsiflexion/Plantar flexion intact Dressing - scant drainage Motor Function - intact, moving foot and toes well on exam.   Abdomen distended with decreased bowel sounds.  Positive tympany throughout the abdomen. Abdomen appears less distended this AM, bowel sounds improved.  Past Medical History  Diagnosis Date  . COPD (chronic obstructive pulmonary disease) ([place])   . CAD (coronary artery disease)   . Hyperlipidemia   . Familial tremor   . Bleeding ulcer 09/2012    a. gastric  . Orthostatic hypotension     a. leading to prior syncope  . Hypothyroidism   . Essential hypertension   . PAF (paroxysmal atrial fibrillation) (HCC)     a. in the post-op setting and associated  with COPD exacerbation    Assessment/Plan:   7 Days Post-Op Procedure(s) (LRB): ARTHROPLASTY BIPOLAR HIP (HEMIARTHROPLASTY) (Left) Principal Problem:   Femur fracture (HCC) Active Problems:   COPD (chronic obstructive pulmonary disease) ([place])   New onset atrial fibrillation (HCC)   Respiratory distress   Fall   Status post hip hemiarthroplasty  Estimated body mass index is 29.52 kg/(m^2) as calculated from the following:   Height as of this encounter: '5\' 9"'$  (1.753 m).   Weight as of this encounter: 90.7 kg (199 lb 15.3 oz). Advance diet Up with therapy  Plan on discharge to SNF once medically cleared Remove staples and apply steri strips 04/15/16. Follow up with [place] ortho in 6 weeks.  Post-op Ileus. KUB reveals air-filled mildly dilated colon likely postop ileus.  K+ 4.0. NG tube placed and draining brown discharge. Abdomen still distended, BS improved. According to nurses notes, patient has had a bowel movement since yesterday. Continue to get up with therapy, may help with bowel distention.  DVT Prophylaxis - Lovenox, Foot Pumps and TED hose Weight-Bearing as tolerated to left leg D/C O2 and Pulse OX and try on [place] PA-C [place] 05/09/2016, 7:50 AM

## 2016-05-09 NOTE — Clinical Social Work Note (Signed)
Pt will discharge to [facility] when medically stable. CSW will continue to follow.   Darden Dates, MSW, LCSW Clinical Social Worker  [phone]

## 2016-05-10 ENCOUNTER — Inpatient Hospital Stay: Payer: Medicare Other

## 2016-05-10 LAB — SODIUM: SODIUM: 136 mmol/L (ref 135–145)

## 2016-05-10 LAB — HEMOGLOBIN: HEMOGLOBIN: 10.9 g/dL — AB (ref 13.0–18.0)

## 2016-05-10 MED ORDER — PANTOPRAZOLE SODIUM 40 MG PO PACK
40.0000 mg | PACK | Freq: Every day | ORAL | Status: DC
  Administered 2016-05-10 – 2016-05-12 (×3): 40 mg
  Filled 2016-05-10 (×3): qty 20

## 2016-05-10 MED ORDER — PANTOPRAZOLE SODIUM 40 MG IV SOLR: 40.0000 mg | Freq: Every day | INTRAVENOUS | Status: DC

## 2016-05-10 NOTE — Progress Notes (Signed)
[facility] [place] NAME: Jeffrey Haynes    MR#:  [id number]  DATE OF BIRTH:  [birth year]  SUBJECTIVE:   No bowel movement today despite getting GoLYTELY. NG tube in place. Worked with physical therapy today.  REVIEW OF SYSTEMS:    Review of Systems  Constitutional: Negative for fever and chills.  HENT: Negative for congestion and tinnitus.   Eyes: Negative for blurred vision and double vision.  Respiratory: Negative for cough, shortness of breath and wheezing.   Cardiovascular: Negative for chest pain, orthopnea and PND.  Gastrointestinal: Positive for constipation. Negative for nausea, vomiting, abdominal pain and diarrhea.  Genitourinary: Negative for dysuria and hematuria.  Neurological: Positive for weakness (generalized). Negative for dizziness, sensory change and focal weakness.  All other systems reviewed and are negative.   Nutrition: NPO Tolerating Diet: No Tolerating PT: Eval noted.     DRUG ALLERGIES:  No Known Allergies  VITALS:  Blood pressure 133/55, pulse 71, temperature 98.7 F (37.1 C), temperature source Axillary, resp. rate 20, height '5\' 9"'$  (1.753 m), weight 90.7 kg (199 lb 15.3 oz), SpO2 94 %.  PHYSICAL EXAMINATION:   Physical Exam  GENERAL:  82 y.o.-year-old patient lying in the bed in no acute distress.  EYES: Pupils equal, round, reactive to light and accommodation. No scleral icterus. Extraocular muscles intact.  HEENT: Head atraumatic, normocephalic. Oropharynx and nasopharynx clear. NG tube in place.  NECK:  Supple, no jugular venous distention. No thyroid enlargement, no tenderness.  LUNGS: Normal breath sounds bilaterally, no wheezing, rales, rhonchi. No use of accessory muscles of respiration.  CARDIOVASCULAR: S1, S2 normal. No murmurs, rubs, or gallops.  ABDOMEN: Soft, nontender, distended. Hypoactive Bowel sounds. No organomegaly or mass.  EXTREMITIES: No cyanosis, clubbing or edema b/l.     NEUROLOGIC: Cranial nerves II through XII are intact. No focal Motor or sensory deficits b/l.  Globally weak PSYCHIATRIC: The patient is alert and oriented x 3.  SKIN: No obvious rash, lesion, or ulcer.    LABORATORY PANEL:   CBC  Recent Labs Lab 05/10/16 0358  HGB 10.9*   ------------------------------------------------------------------------------------------------------------------  Chemistries   Recent Labs Lab 05/05/16 0322  05/07/16 0606  05/09/16 0446 05/10/16 0358  NA 134*  < > 134*  --   --  136  K 4.0  --  4.0  --   --   --   CL 105  --   --   --   --   --   CO2 25  --   --   --   --   --   GLUCOSE 136*  --   --   --   --   --   BUN 12  --   --   --   --   --   CREATININE 0.93  --   --   < > 0.87  --   CALCIUM 7.8*  --   --   --   --   --   < > = values in this interval not displayed. ------------------------------------------------------------------------------------------------------------------  Cardiac Enzymes  Recent Labs Lab 05/04/16 2055  TROPONINI <0.03   ------------------------------------------------------------------------------------------------------------------  RADIOLOGY:  Dg Abd 1 View  05/10/2016  CLINICAL DATA:  Ileus.  Recent hip replacement. EXAM: ABDOMEN - 1 VIEW COMPARISON:  05/09/2016 FINDINGS: Enteric tube remains in place terminating in the region of the distal stomach. Large amount of gas is again seen throughout the colon, not significantly changed. A small  amount of gas is present in the rectum. No dilated small bowel loops are seen. No gross intraperitoneal free air on this supine study. Lumbar disc degeneration and left hip arthroplasty. IMPRESSION: Unchanged gaseous distension of the colon which may reflect ileus. Electronically Signed   By: Logan Bores M.D.   On: 05/10/2016 11:08   Dg Abd 1 View  05/09/2016  CLINICAL DATA:  Difficulty passing stool. Postoperative ileus. Recent left hip surgery. EXAM: ABDOMEN - 1 VIEW  COMPARISON:  05/08/2016 FINDINGS: Nasogastric tube in distal stomach region. Evidence for multiple calcified gallstones. There is a large amount of gas throughout the colon. There is also gas in the rectal region. There is a left hip arthroplasty. Severe degenerative endplate and disc changes in the lumbar spine. Moderate amount of stool in the right colon. Limited evaluation for free air on these supine images. IMPRESSION: Persistent gas throughout the colon and similar to the previous examination. Findings could be associated with a colonic ileus pattern. Electronically Signed   By: Markus Daft M.D.   On: 05/09/2016 08:40   Dg Abd 1 View  05/08/2016  CLINICAL DATA:  Colonic ileus after surgery. EXAM: ABDOMEN - 1 VIEW COMPARISON:  May 07, 2016 FINDINGS: Air-filled dilated loops of colon persist with a tiny amount of air in the rectum. Calcifications in the right upper quadrant consistent with cholelithiasis. No other interval changes. No free air, portal venous gas, or pneumatosis seen on supine imaging. IMPRESSION: Persistent colonic ileus, similar in the interval. Electronically Signed   By: Dorise Bullion III M.D   On: 05/08/2016 16:08     ASSESSMENT AND PLAN:   82 year old male with past medical history of COPD, coronary artery disease, hyperlipidemia, peptic ulcer disease, paroxysmal atrial fibrillation, hypothyroidism who presented to the hospital after a fall and noted to have a left femur fracture. Postoperatively patient has had complications with A. fib with RVR, colonic ileus.  1. Ileus/postoperative - patient is status post NG tube placement. Given GoLYTELY through the NG yesterday with no bowel movement stable. Abdomen slightly distended. -Continue supportive care with IV fluids, Colace, MiraLAX and will monitor. - cont. Reglan.   -Repeat KUB in a.m. Next  2. History of atrial fibrillation with rapid ventricular response-much improved and was on amiodarone drip was  discontinued. -Continue metoprolol, oral amiodarone.  3. Left hip fracture-status post hemiarthroplasty on 05/03/2016. -Continue pain control as per orthopedics. Tolerating physical therapy well. We'll discharge to skilled nursing facility when stable.  4. Leukocytosis-this was reactive in nature now resolved.  5. COPD without exacerbation-cont. Albuterol nebs.   - stable.   6. Hypothyroidism - cont. Synthroid.   7. GERD - cont. Protonix.   8. Hyponatremia - improved w/ IV fluids and will monitor.    All the records are reviewed and case discussed with Care Management/Social Workerr. Management plans discussed with the patient, family and they are in agreement.  CODE STATUS: Full  DVT Prophylaxis: Lovenox  TOTAL TIME TAKING CARE OF THIS PATIENT: 30 minutes.   POSSIBLE D/C IN 2-3 DAYS, DEPENDING ON CLINICAL CONDITION.   Henreitta Leber M.D on 05/10/2016 at 2:44 PM  Between 7am to 6pm - Pager - [phone]  After 6pm go to [email or web address] - password [email or web address] [email or web address]  Office  [phone]  CC: Primary care physician; Golden Pop, MD

## 2016-05-10 NOTE — Progress Notes (Signed)
Initial Nutrition Assessment  DOCUMENTATION CODES:   Not applicable  INTERVENTION:   RD notes pt had NG tube placed to suction on Thursday, June 1 and set to LIS. Pt is currently day 4 NPO. If pt unable to safely take po nutrition, pt would likely benefit from initiation of nutrition support within the next 24-48 hours. RD notes KUB ordered for am as pt with post-op ileus. Will continue to monitor closely and make recommendations accordingly.  Also recommend daily weights, notably last weight from admission.   NUTRITION DIAGNOSIS:   Inadequate oral intake related to inability to eat as evidenced by NPO status.  GOAL:   Patient will meet greater than or equal to 90% of their needs  MONITOR:   Diet advancement, Weight trends, Labs, I & O's  REASON FOR ASSESSMENT:   Consult Poor PO  ASSESSMENT:   Pt admitted with femur fracture, hemiarthroplasty surgery on 05/02/2016.  Past Medical History  Diagnosis Date  . COPD (chronic obstructive pulmonary disease) ([place])   . CAD (coronary artery disease)   . Hyperlipidemia   . Familial tremor   . Bleeding ulcer 09/2012    a. gastric  . Orthostatic hypotension     a. leading to prior syncope  . Hypothyroidism   . Essential hypertension   . PAF (paroxysmal atrial fibrillation) ([facility])     a. in the post-op setting and associated with COPD exacerbation   Diet Order:  Diet NPO time specified day 4   Pt make NPO on 5/30 per chart review. 100% of FL was documented that morning for breakfast. Pt had Ng tube placed to LIS on 6/1 and has remained NPO.   Pt reports he was eating 'ok' before admission with a usual appetite. Pt reports drinking Ensures at times PTA but trying to eat 3 meals per day. Pt reports after surgery he was eating 'fair' before he made NPO. Per chart review po intake from 5/27-5/30 on average 51% of meals FL started on 5/28. Pt reports to Probation officer that he did not bring his dentures so he was trying to eat very soft foods  such a pudding, soft fish, mashed potatoes, ice cream, jello etc.    Medications: Colace, Reglan, Protonix, Golytely via NG tube twice daily per order, NS at 112m/hr Labs: reviewed, Na 136 this am   Gastrointestinal Profile: pt reports to RD that he had a small stool earlier today Output via NG tube: 567mdocumented during last 24 hours, 15041mut so far this shift Last BM:  05/10/2016   Nutrition-Focused Physical Exam Findings: Nutrition-Focused physical exam completed. Findings are WDL for fat depletion, muscle depletion, and edema.    Weight Change: Pt reports usual body weight around the 180lbs. Current weight in CHL 199lbs on admission. Pt reports thinking his weight has been stable.   Skin:  Reviewed, no issues   Height:   Ht Readings from Last 1 Encounters:  05/04/16 '5\' 9"'$  (1.753 m)    Weight:   Wt Readings from Last 1 Encounters:  05/04/16 199 lb 15.3 oz (90.7 kg)   Wt Readings from Last 10 Encounters:  05/04/16 199 lb 15.3 oz (90.7 kg)  03/25/16 189 lb (85.73 kg)  03/10/16 187 lb (84.823 kg)  11/06/15 184 lb (83.462 kg)  04/09/15 193 lb (87.544 kg)     BMI:  Body mass index is 29.52 kg/(m^2).  Estimated Nutritional Needs:   Kcal:  2050-2420kcals  Protein:  99-117g protein  Fluid:  >/= 2L fluid  EDUCATION NEEDS:   Education needs no appropriate at this time  Dwyane Luo, RD, LDN Pager [phone] Weekend/On-Call Pager [phone]

## 2016-05-10 NOTE — Progress Notes (Addendum)
Subjective: 8 Days Post-Op Procedure(s) (LRB): ARTHROPLASTY BIPOLAR HIP (HEMIARTHROPLASTY) (Left) Patient reports pain as moderate. He was a little sore from physical therapy yesterday.  Patient is well, and has had no acute complaints or problems. Pt suffering from post-op ileus. The patient did have a bowel movement 2 days ago. Denies any CP or stomach pain.  SOB improved. We will continue therapy today.  Plan is to go Rehab after hospital stay.  Objective: Vital signs in last 24 hours: Temp:  [98 F (36.7 C)-98.7 F (37.1 C)] 98 F (36.7 C) (06/03 0412) Pulse Rate:  [73-81] 73 (06/03 0412) Resp:  [17-24] 20 (06/03 0412) BP: (126-157)/(46-59) 146/48 mmHg (06/03 0412) SpO2:  [93 %-100 %] 93 % (06/03 0412) FiO2 (%):  [21 %] 21 % (06/02 1501)  Intake/Output from previous day: 06/02 0701 - 06/03 0700 In: 878.3 [I.V.:678.3; NG/GT:200] Out: 150 [Urine:100; Emesis/NG output:50] Intake/Output this shift:     Recent Labs  05/09/16 0446 05/10/16 0358  HGB 10.2* 10.9*   No results for input(s): WBC, RBC, HCT, PLT in the last 72 hours.  Recent Labs  05/08/16 0640 05/09/16 0446 05/10/16 0358  NA  --   --  136  CREATININE 0.93 0.87  --    No results for input(s): LABPT, INR in the last 72 hours.  EXAM General - Patient is Alert, Appropriate and Oriented Extremity - Neurovascular intact Sensation intact distally Intact pulses distally Dorsiflexion/Plantar flexion intact Dressing - scant drainage Motor Function - intact, moving foot and toes well on exam.  The patient was able to ambulate 60 feet with physical therapy.   Past Medical History  Diagnosis Date  . COPD (chronic obstructive pulmonary disease) ([place])   . CAD (coronary artery disease)   . Hyperlipidemia   . Familial tremor   . Bleeding ulcer 09/2012    a. gastric  . Orthostatic hypotension     a. leading to prior syncope  . Hypothyroidism   . Essential hypertension   . PAF (paroxysmal atrial  fibrillation) ([facility])     a. in the post-op setting and associated with COPD exacerbation    Assessment/Plan:   8 Days Post-Op Procedure(s) (LRB): ARTHROPLASTY BIPOLAR HIP (HEMIARTHROPLASTY) (Left) Principal Problem:   Femur fracture ([facility]) Active Problems:   COPD (chronic obstructive pulmonary disease) ([place])   New onset atrial fibrillation (H[facility])   Respiratory distress   Fall   Status post hip hemiarthroplasty  Estimated body mass index is 29.52 kg/(m^2) as calculated from the following:   Height as of this encounter: '5\' 9"'$  (1.753 m).   Weight as of this encounter: 90.7 kg (199 lb 15.3 oz). Advance diet Up with therapy  Plan on discharge to SNF once medically cleared Remove staples and apply steri strips 04/15/16. Follow up with [facility] in 6 weeks.  Post-op Ileus. KUB reveals air-filled mildly dilated colon likely postop ileus. NG tube placed and draining brown discharge. There is decreasing fluid from the NG tube. Abdomen still distended, BS improved. According to nurses notes, patient has had a bowel movement 2 days ago. Continue to get up with therapy, may help with bowel distention.  DVT Prophylaxis - Lovenox, Foot Pumps and TED hose Weight-Bearing as tolerated to left leg  Reche Dixon PA-C [place] 05/10/2016, 6:25 AM   Laurice Record. Holley Bouche M.D.

## 2016-05-10 NOTE — Progress Notes (Signed)
Physical Therapy Treatment Patient Details Name: Jeffrey Haynes MRN: [id number] DOB: 06/07/1925 Today's Date: 05/10/2016    History of Present Illness Normally active 82 y/o male who suffered a fall with L hip fx, needing replacement (posterior approach)    PT Comments    Pt presents to PT this date with fatigue from not sleeping well and audible wheezing with bed mobility.  O2 sats at 98% on room air after ambulation, however pt with increased work of breath.  Pt ambulating 15' with RW and Min A following with recliner due to decreased confidence on pt's part.  Pt with urgent BM during ambulation and assisted to bathroom to complete BM and for hygiene.  NA assisting with bathroom care.  Pt in good spirits after BM and left in recliner.  Pt continues with NG tube for post op ileus.  Cont to progress POC as tolerated.   Follow Up Recommendations  SNF     Equipment Recommendations  Rolling walker with 5" wheels    Recommendations for Other Services       Precautions / Restrictions Precautions Precautions: Fall;Posterior Hip Restrictions LLE Weight Bearing: Weight bearing as tolerated    Mobility  Bed Mobility Overal bed mobility: Needs Assistance Bed Mobility: Supine to Sit     Supine to sit: Mod assist     General bed mobility comments: HOB elevated, uses rails, able to move L LE on bed; assist to elevate trunk  Transfers Overall transfer level: Needs assistance Equipment used: Rolling walker (2 wheeled);None Transfers: Sit to/from Stand Sit to Stand: Mod assist         General transfer comment: Increased effort with sit<>stand with decreased balance during hand transfer to RW, Mod A for balance, pt bracing posterior knees against bed for stability  Ambulation/Gait Ambulation/Gait assistance: Min assist Ambulation Distance (Feet): 15 Feet Assistive device: Rolling walker (2 wheeled) Gait Pattern/deviations: Step-to pattern     General Gait Details: Slow at first,  able to increase cadence after 5 feet, then stopped due to urgent BM   Stairs            Wheelchair Mobility    Modified Rankin (Stroke Patients Only)       Balance                                    Cognition Arousal/Alertness: Lethargic (easily aroused, reports not sleeping well) Behavior During Therapy: WFL for tasks assessed/performed Overall Cognitive Status: Within Functional Limits for tasks assessed                      Exercises Other Exercises Other Exercises: On/off toilet with Mod A, verbal cues for hand placment and use of wall rail; total assist for hygeine and dressing with new gown.    General Comments        Pertinent Vitals/Pain Pain Assessment: No/denies pain    Home Living                      Prior Function            PT Goals (current goals can now be found in the care plan section) Progress towards PT goals: Progressing toward goals    Frequency  BID    PT Plan Current plan remains appropriate    Co-evaluation  End of Session Equipment Utilized During Treatment: Gait belt Activity Tolerance: Patient limited by fatigue Patient left: in chair;with call bell/phone within reach;with chair alarm set;Other (comment)     Time: 1594-5859 PT Time Calculation (min) (ACUTE ONLY): 35 min  Charges:  $Gait Training: 8-22 mins $Therapeutic Activity: 8-22 mins                    G Codes:      Noemi Ishmael A Akiem Urieta, PT 05-24-16, 1:13 PM

## 2016-05-10 NOTE — Progress Notes (Signed)
PT Cancellation Note  Patient Details Name: Jeffrey Haynes MRN: [id number] DOB: 1925-10-22   Cancelled Treatment:    Reason Eval/Treat Not Completed: Patient declined, no reason specified;  Pt resting comfortably in bed with family at bedside, reports he doesn't want to get out of bed again and just wants to rest.  Will continue to follow.  Tammee Thielke A Kylene Zamarron, PT  05/10/2016, 5:13 PM

## 2016-05-11 ENCOUNTER — Inpatient Hospital Stay: Payer: Medicare Other

## 2016-05-11 LAB — BASIC METABOLIC PANEL
ANION GAP: 11 (ref 5–15)
BUN: 14 mg/dL (ref 6–20)
CALCIUM: 7.5 mg/dL — AB (ref 8.9–10.3)
CO2: 17 mmol/L — ABNORMAL LOW (ref 22–32)
Chloride: 110 mmol/L (ref 101–111)
Creatinine, Ser: 0.81 mg/dL (ref 0.61–1.24)
GFR calc Af Amer: >60 mL/min (ref >60)
GLUCOSE: 67 mg/dL (ref 65–99)
POTASSIUM: 3.6 mmol/L (ref 3.5–5.1)
SODIUM: 138 mmol/L (ref 135–145)

## 2016-05-11 LAB — GLUCOSE, CAPILLARY
GLUCOSE-CAPILLARY: 59 mg/dL — AB (ref 65–99)
Glucose-Capillary: 132 mg/dL — ABNORMAL HIGH (ref 65–99)

## 2016-05-11 MED ORDER — GUAIFENESIN 100 MG/5ML PO SOLN
5.0000 mL | ORAL | Status: DC | PRN
  Administered 2016-05-11: 100 mg via ORAL
  Filled 2016-05-11: qty 10

## 2016-05-11 MED ORDER — DEXTROSE 50 % IV SOLN
INTRAVENOUS | Status: AC
  Administered 2016-05-11: 25 mL via INTRAVENOUS
  Filled 2016-05-11: qty 50

## 2016-05-11 MED ORDER — DEXTROSE 50 % IV SOLN
25.0000 mL | Freq: Once | INTRAVENOUS | Status: AC
  Administered 2016-05-11: 25 mL via INTRAVENOUS

## 2016-05-11 NOTE — Care Management Important Message (Signed)
Important Message  Patient Details  Name: Jeffrey Haynes MRN: [id number] Date of Birth: 04/30/1925   Medicare Important Message Given:  Yes    Rosaleigh Brazzel A, RN 05/11/2016, 2:34 PM

## 2016-05-11 NOTE — Progress Notes (Signed)
Pt's tube has been clamped per MD order to determine if he is able to tolerate liquids by mouth. Pt also requesting something to treat his coughing. Dr. Verdell Carmine notified, order to give pt robitussin 27m Q 4hr PRN for cough.

## 2016-05-11 NOTE — Progress Notes (Addendum)
Subjective: 9 Days Post-Op Procedure(s) (LRB): ARTHROPLASTY BIPOLAR HIP (HEMIARTHROPLASTY) (Left) Patient reports pain as moderate. Doing better this morning. The patient states that he has had multiple bowel movements last night.  Patient is well, and has had no acute complaints or problems. Pt suffering from post-op ileus. He is feeling better this morning. Denies any CP or stomach pain.  SOB improved. We will continue therapy today.  Plan is to go Rehab after hospital stay.  Objective: Vital signs in last 24 hours: Temp:  [97.9 F (36.6 C)-98.7 F (37.1 C)] 98.5 F (36.9 C) (06/04 0407) Pulse Rate:  [71-79] 74 (06/04 0407) Resp:  [18-20] 18 (06/04 0407) BP: (131-146)/(53-61) 141/61 mmHg (06/04 0407) SpO2:  [94 %-97 %] 94 % (06/04 0407) FiO2 (%):  [21 %] 21 % (06/03 1314)  Intake/Output from previous day: 06/03 0701 - 06/04 0700 In: 4690 [I.V.:4140; NG/GT:550] Out: 150 [Emesis/NG output:150] Intake/Output this shift: Total I/O In: 4390 [I.V.:4140; NG/GT:250] Out: -    Recent Labs  05/09/16 0446 05/10/16 0358  HGB 10.2* 10.9*   No results for input(s): WBC, RBC, HCT, PLT in the last 72 hours.  Recent Labs  05/09/16 0446 05/10/16 0358 05/11/16 0413  NA  --  136 138  K  --   --  3.6  CL  --   --  110  CO2  --   --  17*  BUN  --   --  14  CREATININE 0.87  --  0.81  GLUCOSE  --   --  67  CALCIUM  --   --  7.5*   No results for input(s): LABPT, INR in the last 72 hours.  EXAM General - Patient is Alert, Appropriate and Oriented Extremity - Neurovascular intact Sensation intact distally Intact pulses distally Dorsiflexion/Plantar flexion intact Dressing - scant drainage Motor Function - intact, moving foot and toes well on exam.  The patient was able to ambulate 15 feet with physical therapy yesterday.   Past Medical History  Diagnosis Date  . COPD (chronic obstructive pulmonary disease) ([place])   . CAD (coronary artery disease)   . Hyperlipidemia   .  Familial tremor   . Bleeding ulcer 09/2012    a. gastric  . Orthostatic hypotension     a. leading to prior syncope  . Hypothyroidism   . Essential hypertension   . PAF (paroxysmal atrial fibrillation) ([facility])     a. in the post-op setting and associated with COPD exacerbation    Assessment/Plan:   9 Days Post-Op Procedure(s) (LRB): ARTHROPLASTY BIPOLAR HIP (HEMIARTHROPLASTY) (Left) Principal Problem:   Femur fracture ([facility]) Active Problems:   COPD (chronic obstructive pulmonary disease) ([place])   New onset atrial fibrillation ([facility])   Respiratory distress   Fall   Status post hip hemiarthroplasty  Estimated body mass index is 29.52 kg/(m^2) as calculated from the following:   Height as of this encounter: '5\' 9"'$  (1.753 m).   Weight as of this encounter: 90.7 kg (199 lb 15.3 oz). Advance diet Up with therapy  Plan on discharge to SNF once medically cleared Remove staples and apply steri strips 04/15/16. Follow up with [facility] in 6 weeks.  Post-op Ileus. KUB reveals air-filled mildly dilated colon likely postop ileus. NG tube placed and draining brown discharge. There is decreasing fluid from the NG tube. Abdomen Minimally distended, BS improved. The patient received GoLYTELY yesterday and has had multiple bowel movements last night. Continue to get up with therapy, may help with bowel distention.  DVT Prophylaxis - Lovenox, Foot Pumps and TED hose Weight-Bearing as tolerated to left leg  Reche Dixon PA-C [place] 05/11/2016, 6:41 AM   Addendum: Patient just returned from KUB. Results pending. Abdomen less distended this AM. Hypoactive bowel sounds. Continue as per Medicine. Continue PT.  Laurice Record. Holley Bouche M.D.

## 2016-05-11 NOTE — Progress Notes (Signed)
Pt is NPO due to have NG tube in place. He is ordered to take extended release metoprolol that can not be crushed and put through tube. Pt expressed that he wanted to try to swallow the pill and he did so successfully. Will update MD on rounds and see if he would like to change to the medication to a crushable form from here on out.

## 2016-05-11 NOTE — Progress Notes (Signed)
Paged MD for blood glucose of 59

## 2016-05-11 NOTE — Progress Notes (Signed)
[facility] [place] NAME: Jeffrey Haynes    MR#:  [id number]  DATE OF BIRTH:  10/24/1925  SUBJECTIVE:   Had multiple loose bowel movements yesterday. Abdomen is soft. No nausea or vomiting this morning. Patient's son is at bedside. Had a hypoglycemic episode which has improved.  REVIEW OF SYSTEMS:    Review of Systems  Constitutional: Negative for fever and chills.  HENT: Negative for congestion and tinnitus.   Eyes: Negative for blurred vision and double vision.  Respiratory: Negative for cough, shortness of breath and wheezing.   Cardiovascular: Negative for chest pain, orthopnea and PND.  Gastrointestinal: Negative for nausea, vomiting, abdominal pain, diarrhea and constipation.  Genitourinary: Negative for dysuria and hematuria.  Neurological: Positive for weakness (generalized). Negative for dizziness, sensory change and focal weakness.  All other systems reviewed and are negative.   Nutrition: Clear liquids Tolerating Diet: Awaiting to see if he does. Tolerating PT: Eval noted.     DRUG ALLERGIES:  No Known Allergies  VITALS:  Blood pressure 137/54, pulse 71, temperature 98.5 F (36.9 C), temperature source Oral, resp. rate 16, height '5\' 9"'$  (1.753 m), weight 96.344 kg (212 lb 6.4 oz), SpO2 95 %.  PHYSICAL EXAMINATION:   Physical Exam  GENERAL:  82 y.o.-year-old patient lying in the bed in no acute distress.  EYES: Pupils equal, round, reactive to light and accommodation. No scleral icterus. Extraocular muscles intact.  HEENT: Head atraumatic, normocephalic. Oropharynx and nasopharynx clear. NG tube in place.  NECK:  Supple, no jugular venous distention. No thyroid enlargement, no tenderness.  LUNGS: Normal breath sounds bilaterally, no wheezing, rales, rhonchi. No use of accessory muscles of respiration.  CARDIOVASCULAR: S1, S2 normal. No murmurs, rubs, or gallops.  ABDOMEN: Soft, nontender, distension has improved.  + BS.  No organomegaly or mass.  EXTREMITIES: No cyanosis, clubbing or edema b/l.    NEUROLOGIC: Cranial nerves II through XII are intact. No focal Motor or sensory deficits b/l.  Globally weak PSYCHIATRIC: The patient is alert and oriented x 3.  SKIN: No obvious rash, lesion, or ulcer.    LABORATORY PANEL:   CBC  Recent Labs Lab 05/10/16 0358  HGB 10.9*   ------------------------------------------------------------------------------------------------------------------  Chemistries   Recent Labs Lab 05/11/16 0413  NA 138  K 3.6  CL 110  CO2 17*  GLUCOSE 67  BUN 14  CREATININE 0.81  CALCIUM 7.5*   ------------------------------------------------------------------------------------------------------------------  Cardiac Enzymes  Recent Labs Lab 05/04/16 2055  TROPONINI <0.03   ------------------------------------------------------------------------------------------------------------------  RADIOLOGY:  Dg Abd 1 View  05/11/2016  CLINICAL DATA:  Postop ileus EXAM: ABDOMEN - 1 VIEW COMPARISON:  05/10/2016 FINDINGS: Continued gaseous distention of the colon, no significant change. No organomegaly or free air. NG tube tip is in the distal stomach, stable. No acute bony abnormality. Degenerative changes in the lumbar spine. Prior low left hip replacement. IMPRESSION: Continued mild gaseous distention of the colon, likely adynamic ileus. Electronically Signed   By: Rolm Baptise M.D.   On: 05/11/2016 07:51   Dg Abd 1 View  05/10/2016  CLINICAL DATA:  Ileus.  Recent hip replacement. EXAM: ABDOMEN - 1 VIEW COMPARISON:  05/09/2016 FINDINGS: Enteric tube remains in place terminating in the region of the distal stomach. Large amount of gas is again seen throughout the colon, not significantly changed. A small amount of gas is present in the rectum. No dilated small bowel loops are seen. No gross intraperitoneal free air on this supine study. Lumbar  disc degeneration and left hip  arthroplasty. IMPRESSION: Unchanged gaseous distension of the colon which may reflect ileus. Electronically Signed   By: Logan Bores M.D.   On: 05/10/2016 11:08     ASSESSMENT AND PLAN:   82 year old male with past medical history of COPD, coronary artery disease, hyperlipidemia, peptic ulcer disease, paroxysmal atrial fibrillation, hypothyroidism who presented to the hospital after a fall and noted to have a left femur fracture. Postoperatively patient has had complications with A. fib with RVR, colonic ileus.  1. Ileus/postoperative - patient is status post NG tube placement. Given GoLYTELY through the NG 05/09/16 and had multiple BMs overnight. Abdomen less distended and soft today. KUB this morning showing mild gaseous distention. -We'll clamp NG tube, start on clear liquids.  -Continue supportive care with IV fluids and we'll DC them if he is tolerating by mouth well. Cont. Colace, MiraLAX and will monitor. - cont. Reglan.    2. History of atrial fibrillation with rapid ventricular response-much improved and was on amiodarone drip which has now been discontinued. -Continue metoprolol, oral amiodarone. - d/c tele.  Doing well.   3. Left hip fracture-status post hemiarthroplasty on 05/03/2016. -Continue pain control as per orthopedics. Tolerating physical therapy well. We'll discharge to skilled nursing facility when stable.  4. Leukocytosis-this was reactive in nature now resolved.  5. COPD without exacerbation-cont. Albuterol nebs.   - stable.   6. Hypothyroidism - cont. Synthroid.   7. GERD - cont. Protonix.   8. Hyponatremia - improved w/ IV fluids and will monitor.    All the records are reviewed and case discussed with Care Management/Social Workerr. Management plans discussed with the patient, family and they are in agreement.  CODE STATUS: Full  DVT Prophylaxis: Lovenox  TOTAL TIME TAKING CARE OF THIS PATIENT: 25 minutes.   POSSIBLE D/C IN 1-2 DAYS, DEPENDING ON  CLINICAL CONDITION.   Henreitta Leber M.D on 05/11/2016 at 12:05 PM  Between 7am to 6pm - Pager - [phone]  After 6pm go to [email or web address] - password [email or web address] [facility]  Office  [phone]  CC: Primary care physician; Golden Pop, MD

## 2016-05-11 NOTE — Progress Notes (Signed)
MD ordered 1 amp of D50, administered and will recheck blood glucose in 30 minutes

## 2016-05-11 NOTE — Progress Notes (Signed)
Pt has had 3 BMs since 0700 this AM and according to night shift had 8 BMs between day shift yesterday and last night. Called Dr. Verdell Carmine and he ordered to D/C all laxatives.

## 2016-05-11 NOTE — Progress Notes (Signed)
Per Dr. Verdell Carmine, if pt tolerated his clear liquid trays today, his IVf could be d/c'd. Pt has tolerated PO fluid well today. Per Dr. Verdell Carmine will D/C fluids and pt has been encouraged to continue to drink throughout the night as needed.

## 2016-05-11 NOTE — Evaluation (Signed)
Physical Therapy Evaluation Patient Details Name: Jeffrey Haynes MRN: [id number] DOB: 07/23/1925 Today's Date: 05/11/2016   History of Present Illness  Normally active 82 y/o male who suffered a fall with L hip fx, needing replacement (posterior approach)  Clinical Impression  Patient is having loose stools/ diarrhea today and needs to stay in the bed for easier assist with this. Patient needs mod assist for supine to sit bed mobility and min assist for sit to stand transfers with RW. He ambulates 30 feet with RW and CGA and IV pole. He reports feeling fatigued from all the BM's and needs mod assist to perform bed mobility getting back into the bed.     Follow Up Recommendations SNF    Equipment Recommendations  Rolling walker with 5" wheels    Recommendations for Other Services       Precautions / Restrictions Precautions Precautions: Fall;Posterior Hip Precaution Booklet Issued: Yes (comment) Restrictions Weight Bearing Restrictions: Yes LLE Weight Bearing: Weight bearing as tolerated      Mobility  Bed Mobility Overal bed mobility: Needs Assistance Bed Mobility: Supine to Sit Rolling: Min assist   Supine to sit: Mod assist Sit to supine: Min assist   General bed mobility comments: HOB elevated, uses rails, able to move L LE on bed; assist to elevate trunk  Transfers Overall transfer level: Needs assistance Equipment used: Rolling walker (2 wheeled);None   Sit to Stand: Mod assist         General transfer comment: Increased effort with sit<>stand with decreased balance during hand transfer to RW, Mod A for balance, pt bracing posterior knees against bed for stability  Ambulation/Gait   Ambulation Distance (Feet): 30 Feet Assistive device: Rolling walker (2 wheeled) Gait Pattern/deviations: Step-to pattern Gait velocity: decreased Gait velocity interpretation: <1.8 ft/sec, indicative of risk for recurrent falls    Stairs            Wheelchair Mobility    Modified Rankin (Stroke Patients Only)       Balance Overall balance assessment: Modified Independent Sitting-balance support: Single extremity supported Sitting balance-Leahy Scale: Fair Sitting balance - Comments: Requires use of UEs for support otherwise R posterolateral lean/fall   Standing balance support: Bilateral upper extremity supported Standing balance-Leahy Scale: Fair                               Pertinent Vitals/Pain Pain Assessment: No/denies pain    Home Living                        Prior Function                 Hand Dominance        Extremity/Trunk Assessment                         Communication      Cognition Arousal/Alertness: Awake/alert Behavior During Therapy: WFL for tasks assessed/performed Overall Cognitive Status: Within Functional Limits for tasks assessed       Memory: Decreased recall of precautions              General Comments      Exercises        Assessment/Plan    PT Assessment    PT Diagnosis     PT Problem List    PT Treatment Interventions     PT Goals (Current goals  can be found in the Care Plan section) Acute Rehab PT Goals Patient Stated Goal: get back to working in the yard PT Goal Formulation: With patient Time For Goal Achievement: 05/17/16 Potential to Achieve Goals: Fair    Frequency BID   Barriers to discharge        Co-evaluation               End of Session   Activity Tolerance: Patient limited by fatigue Patient left: in bed           Time: 1205-1230 PT Time Calculation (min) (ACUTE ONLY): 25 min   Charges:     PT Treatments $Gait Training: 8-22 mins $Therapeutic Activity: 8-22 mins   PT G Codes:      Alanson Puls, PT, DPT  [place], Minette Headland S 05/11/2016, 1:08 PM

## 2016-05-12 LAB — BASIC METABOLIC PANEL
ANION GAP: 7 (ref 5–15)
BUN: 9 mg/dL (ref 6–20)
CO2: 21 mmol/L — ABNORMAL LOW (ref 22–32)
CREATININE: 0.67 mg/dL (ref 0.61–1.24)
Calcium: 7.7 mg/dL — ABNORMAL LOW (ref 8.9–10.3)
Chloride: 110 mmol/L (ref 101–111)
Glucose, Bld: 95 mg/dL (ref 65–99)
POTASSIUM: 3.1 mmol/L — AB (ref 3.5–5.1)
SODIUM: 138 mmol/L (ref 135–145)

## 2016-05-12 MED ORDER — POTASSIUM CHLORIDE 20 MEQ PO PACK
20.0000 meq | PACK | Freq: Three times a day (TID) | ORAL | Status: AC
  Administered 2016-05-12 (×2): 20 meq via ORAL
  Filled 2016-05-12 (×3): qty 1

## 2016-05-12 MED ORDER — AMIODARONE HCL 200 MG PO TABS
200.0000 mg | ORAL_TABLET | Freq: Two times a day (BID) | ORAL | Status: DC
  Administered 2016-05-12 – 2016-05-13 (×2): 200 mg via ORAL
  Filled 2016-05-12 (×2): qty 1

## 2016-05-12 NOTE — Progress Notes (Signed)
Physical Therapy Treatment Patient Details Name: Jeffrey Haynes MRN: [id number] DOB: 1924/12/29 Today's Date: 05/12/2016    History of Present Illness Normally active 82 y/o male who suffered a fall with L hip fx, needing replacement (posterior approach)    PT Comments    Chart review reveals K+ at 3.1 with supplementation. Contacted nursing prior to treatment attempt; nursing notes PT can work with pt in all capacity of pt needs. Pt up in chair; denies abdominal or left hip pain. Pt currently drinking K+ supplement and is noted to have a choking/coughing spell followed by coughing/spitting up thick sputum. This happens again later in session. Nursing called post session to inform and suggest ST consult; nursing to call MD. Pt has some difficulty yet with sit to/from stand transfers in regards to balance and adhering to posterior hip precautions with some improvement with continued instruction during session. Pt does ambulate twice with improvement in pattern and fluidity during second walk. Pt does continue to have some difficulty with balance/sequence on turns and chair approach. Plan to see pt this afternoon to progress strength, endurance, transfers and quality of ambulation to improve functional mobility.   Follow Up Recommendations  SNF     Equipment Recommendations  Rolling walker with 5" wheels    Recommendations for Other Services       Precautions / Restrictions Precautions Precautions: Fall;Posterior Hip Restrictions Weight Bearing Restrictions: Yes LLE Weight Bearing: Weight bearing as tolerated    Mobility  Bed Mobility               General bed mobility comments: Not tested; up in chair  Transfers Overall transfer level: Needs assistance Equipment used: Rolling walker (2 wheeled);None Transfers: Sit to/from Stand Sit to Stand: Mod assist;Min assist         General transfer comment: First stand Mod A for steadiness and increased times to find COG. Pt leaning  heavily on chair with BLEs and foward flexed. Improved balance within 1 minutes of static stand balance and lateral weight shift with cues for posture as well as lowering rw to allow improved weightbeaing through UEs. Second stand with Min A. Both with cues for adherence to posterior hip precautions  Ambulation/Gait Ambulation/Gait assistance: Min guard;Min assist Ambulation Distance (Feet): 52 Feet (performed 2x) Assistive device: Rolling walker (2 wheeled) Gait Pattern/deviations: Step-to pattern;Step-through pattern;Trunk flexed Gait velocity: decreased   General Gait Details: Initial walk more step to pattern with cues for 3 point sequence; occasional mild LOB with Min A required especially on turns. Cues to continue pattern during turns. Second walk improved to reciprocal pattern and improved fluidity and decreased need for 3 point sequence. Continues to require cues and increased assist for balance on turns/chair approach.    Stairs            Wheelchair Mobility    Modified Rankin (Stroke Patients Only)       Balance Overall balance assessment: Needs assistance Sitting-balance support: Feet supported Sitting balance-Leahy Scale: Good     Standing balance support: Bilateral upper extremity supported Standing balance-Leahy Scale: Fair                      Cognition Arousal/Alertness: Awake/alert Behavior During Therapy: WFL for tasks assessed/performed Overall Cognitive Status: Within Functional Limits for tasks assessed       Memory: Decreased recall of precautions              Exercises      General Comments  Pertinent Vitals/Pain Pain Assessment: No/denies pain    Home Living                      Prior Function            PT Goals (current goals can now be found in the care plan section) Progress towards PT goals: Progressing toward goals    Frequency  BID    PT Plan Current plan remains appropriate     Co-evaluation             End of Session Equipment Utilized During Treatment: Gait belt Activity Tolerance: Patient tolerated treatment well Patient left: in chair;with call bell/phone within reach;with chair alarm set;with family/visitor present     Time:  -     Charges:  $Gait Training: 8-22 mins $Therapeutic Activity: 8-22 mins                    G Codes:      Charlaine Dalton, PTA 05/12/2016, 10:23 AM

## 2016-05-12 NOTE — Progress Notes (Signed)
Physical Therapy Treatment Patient Details Name: Jeffrey Haynes MRN: [id number] DOB: May 20, 1925 Today's Date: 05/12/2016    History of Present Illness Normally active 82 y/o male who suffered a fall with L hip fx, needing replacement (posterior approach)    PT Comments    Pt returned to bed post lunch subjectively. Pt notes left hip is a bit more sore this afternoon. Pt does not wish to get out of bed/ambulate at this time. Pt agreeable to supine bed exercises and requires some assist for Left lower extremity exercises as needed. Continue PT to progress strength, endurance and balance to improve all functional mobility.   Follow Up Recommendations  SNF     Equipment Recommendations  Rolling walker with 5" wheels    Recommendations for Other Services       Precautions / Restrictions Precautions Precautions: Fall;Posterior Hip Restrictions Weight Bearing Restrictions: Yes LLE Weight Bearing: Weight bearing as tolerated    Mobility  Bed Mobility               General bed mobility comments: Not tested; pt wishes to remain in bed  Transfers                    Ambulation/Gait                 Stairs            Wheelchair Mobility    Modified Rankin (Stroke Patients Only)       Balance                                    Cognition Arousal/Alertness: Awake/alert Behavior During Therapy: WFL for tasks assessed/performed Overall Cognitive Status: Within Functional Limits for tasks assessed                      Exercises Total Joint Exercises Ankle Circles/Pumps: AROM;Both;20 reps;Supine Quad Sets: Strengthening;Left;15 reps;Supine Gluteal Sets: Strengthening;Both;15 reps;Supine Towel Squeeze: Strengthening;Both;15 reps;Supine Short Arc Quad: AROM;Both;20 reps;Supine Heel Slides: AAROM;Left;20 reps;Supine (AROM R) Hip ABduction/ADduction: AAROM;Left;20 reps;Supine (AROM with assist to keep heel off bed 20x) Straight  Leg Raises: AAROM;Both;20 reps;Supine    General Comments        Pertinent Vitals/Pain Pain Assessment: Faces Faces Pain Scale: Hurts little more Pain Descriptors / Indicators: Sore Pain Intervention(s): Limited activity within patient's tolerance    Home Living                      Prior Function            PT Goals (current goals can now be found in the care plan section) Progress towards PT goals: Progressing toward goals    Frequency  BID    PT Plan Current plan remains appropriate    Co-evaluation             End of Session   Activity Tolerance: Patient limited by fatigue;Other (comment) (falls asleep several times) Patient left: in bed;with call bell/phone within reach;with bed alarm set     Time: 4098-1191 PT Time Calculation (min) (ACUTE ONLY): 23 min  Charges:  $Therapeutic Exercise: 23-37 mins                    G Codes:      Charlaine Dalton, PTA 05/12/2016, 2:57 PM

## 2016-05-12 NOTE — Progress Notes (Signed)
MD returned page. No new orders at this time

## 2016-05-12 NOTE — Progress Notes (Signed)
Subjective: 10 Days Post-Op Procedure(s) (LRB): ARTHROPLASTY BIPOLAR HIP (HEMIARTHROPLASTY) (Left) Patient reports pain as mild. Receiving nebulizer treatment this AM, reports bowel movements. Patient is well, and has had no acute complaints or problems. Pt suffering from post-op ileus. He is feeling better this morning. Denies any CP or stomach pain. We will continue therapy today.  Plan is to go Rehab after hospital stay.  Objective: Vital signs in last 24 hours: Temp:  [97.9 F (36.6 C)-98.6 F (37 C)] 97.9 F (36.6 C) (06/05 0502) Pulse Rate:  [71-77] 73 (06/05 0502) Resp:  [16-22] 18 (06/05 0502) BP: (125-144)/(54-58) 125/58 mmHg (06/05 0502) SpO2:  [94 %-96 %] 96 % (06/05 0502) Weight:  [94.847 kg (209 lb 1.6 oz)-96.344 kg (212 lb 6.4 oz)] 94.847 kg (209 lb 1.6 oz) (06/05 0500)  Intake/Output from previous day: 06/04 0701 - 06/05 0700 In: 1924.2 [P.O.:1000; I.V.:924.2] Out: -  Intake/Output this shift:     Recent Labs  05/10/16 0358  HGB 10.9*   No results for input(s): WBC, RBC, HCT, PLT in the last 72 hours.  Recent Labs  05/11/16 0413 05/12/16 0430  NA 138 138  K 3.6 3.1*  CL 110 110  CO2 17* 21*  BUN 14 9  CREATININE 0.81 0.67  GLUCOSE 67 95  CALCIUM 7.5* 7.7*   No results for input(s): LABPT, INR in the last 72 hours.  EXAM General - Patient is Alert, Appropriate and Oriented Extremity - Neurovascular intact Sensation intact distally Intact pulses distally Dorsiflexion/Plantar flexion intact Dressing - no drainage.  No erythema or drainage from the wound. Motor Function - intact, moving foot and toes well on exam.  The patient was able to ambulate 30 feet with physical therapy yesterday.  Mild wheeze with lung auscultation, hard to auscultate while patient receiving nebulizer treatment. Abdomen still minimally distended, softer this AM compared to previous mornings.  BS improving, mild tympany but improving.   Past Medical History  Diagnosis  Date  . COPD (chronic obstructive pulmonary disease) ([place])   . CAD (coronary artery disease)   . Hyperlipidemia   . Familial tremor   . Bleeding ulcer 09/2012    a. gastric  . Orthostatic hypotension     a. leading to prior syncope  . Hypothyroidism   . Essential hypertension   . PAF (paroxysmal atrial fibrillation) ([facility])     a. in the post-op setting and associated with COPD exacerbation    Assessment/Plan:   10 Days Post-Op Procedure(s) (LRB): ARTHROPLASTY BIPOLAR HIP (HEMIARTHROPLASTY) (Left) Principal Problem:   Femur fracture ([facility]) Active Problems:   COPD (chronic obstructive pulmonary disease) ([place])   New onset atrial fibrillation ([facility])   Respiratory distress   Fall   Status post hip hemiarthroplasty  Estimated body mass index is 30.86 kg/(m^2) as calculated from the following:   Height as of this encounter: '5\' 9"'$  (1.753 m).   Weight as of this encounter: 94.847 kg (209 lb 1.6 oz). Advance diet Up with therapy  Plan on discharge to SNF once medically cleared Remove staples and apply steri strips 05/16/16. Follow up with [facility] in 6 weeks.  Post-op Ileus. KUB continues to reveal mild gaseous distention of the colon. NG tube remains in place, mild drainage noted. Abdomen Minimally distended, BS improved. Patient has had multiple bowel movements. Patient receiving nebulizer treatment this AM, according to nurses patient was drinking water this AM and got choked and started coughing. K+ 3.1 this AM, will supplement with Klor-con packets. Continue to get  up with therapy, may help with bowel distention.  DVT Prophylaxis - Lovenox, Foot Pumps and TED hose Weight-Bearing as tolerated to left leg  J. Cameron Proud, PA-C [place] 05/12/2016, 7:48 AM

## 2016-05-12 NOTE — Progress Notes (Signed)
Patient is coughing and choking after swallowing thin liquids. No nausea/vomiting. Dr Verdell Carmine paged for speech eval. Waiting on callback.

## 2016-05-12 NOTE — Plan of Care (Signed)
Problem: Activity: Goal: Ability to avoid complications of mobility impairment will improve Outcome: Progressing Patient moving well with PT. Goal: Ability to tolerate increased activity will improve Outcome: Progressing Patient walked some with PT today. Tolerated well.  Goal: Will remain free from falls Outcome: Progressing Patient calls out for assistance. No falls this admission.

## 2016-05-12 NOTE — Progress Notes (Signed)
NA giving pt drink of water and pt got choked started coughing. Sat pt more upright and encouraged to cough. Lungs slightly diminished and some crackles heard in left lobes. Sats 96% on RA resp 22 and pulse 77. MD paged and waiting for returned call

## 2016-05-12 NOTE — Progress Notes (Signed)
Patient wants to wait until after lunch to have staples removed.

## 2016-05-12 NOTE — Evaluation (Signed)
Clinical/Bedside Swallow Evaluation Patient Details  Name: Jeffrey Haynes MRN: [id number] Date of Birth: 04-Apr-1925  Today's Date: 05/12/2016 Time: SLP Start Time (ACUTE ONLY): 1015 SLP Stop Time (ACUTE ONLY): 1115 SLP Time Calculation (min) (ACUTE ONLY): 60 min  Past Medical History:  Past Medical History  Diagnosis Date  . COPD (chronic obstructive pulmonary disease) ([place])   . CAD (coronary artery disease)   . Hyperlipidemia   . Familial tremor   . Bleeding ulcer 09/2012    a. gastric  . Orthostatic hypotension     a. leading to prior syncope  . Hypothyroidism   . Essential hypertension   . PAF (paroxysmal atrial fibrillation) ([facility])     a. in the post-op setting and associated with COPD exacerbation   Past Surgical History:  Past Surgical History  Procedure Laterality Date  . Hernia repair    . Spine surgery  1975    LS disk  . Hip arthroplasty Left 05/02/2016    Procedure: ARTHROPLASTY BIPOLAR HIP (HEMIARTHROPLASTY);  Surgeon: [name], MD;  Location: [facility] ORS;  Service: Orthopedics;  Laterality: Left;   HPI:  Pt is a 82 y.o. male with a known history of CAD, COPD, hyperlipidemia comes in the emergency room after he had a fall, mechanical and home while trying to walk on the carpet and slipped on the tile floor. Patient started having significant left hip pain came to the emergency room was found to have left femur fracture.This was surgicaly repaired, however, pt developed post-op ileus. Of note, ~4 years ago, pt had Esophagogastroduodenoscopy which showed severe erosive gastritis with multiple nonobstructing nonbleeding cratered gastric ulcers of moderate to significant severity with no stigmata of bleeding. Furthermore, the largest lesion was about 7 mm. The patient was also known to have a nonobstructing widely patent Schatzki ring. Pt c/o phlegm and other Reflux-like s/s at home prior to admission. He denied taking any PPIs but was noted to be d/c on Protonix 40 mg 2x daily  after the EGD dx in 2013. Pt denied f/u w/ GI at home. He has had an NG in place post surgery d/t ileus. NG was removed this AM. Pt is on a clear liquid diet; NSG had noted mild coughing intermittently and phlegm.   Assessment / Plan / Recommendation Clinical Impression  Pt appears to present w/ an adequate oropharyngeal phase swallow function w/ no significant oral phase deficits but requires broken down, moistened foods sec. to edentulous status. During the pharyngeal phase, pt exhibited multiple swallows w/ slight wet and throat clearing; pt also expectorated phlegm post trials. Unsure if pt's pharyngeal swallow presentation was d/t dysphagia of thin liquids or d/t Esophageal phase deficits as pt has had significant h/o Esphageal phase issues including severe erosive gastritis w/ bleeding gastric ulcers. Pt has also had an NG placed post surgery d/t ileus which may have irritated the Esophagus thus increased phlegm presentation. Pt appeared to adequately tolerate small, single sips of thin liquids (water) via cup following strict aspiration precautions. Recommend initiation of diet upgrade to Dysphagia 2 w/ more purees in the diet, thin liquids; strict aspiration and REFLUX precautions. Recommend meds be given in Puree for easier swallowing. ST will f/u w/ pt's status and presentation while admitted; recommend f/u w/ GI for reflux-like symptoms. MD updated and agreed.    Aspiration Risk  Mild aspiration risk (from Reflux material/phlegm)    Diet Recommendation  Dysphagia 2 w/ thin liquids; strict aspiration and Reflux precautions; Meds in Puree for easier  swallowing.   Medication Administration: Whole meds with puree    Other  Recommendations Recommended Consults: Consider GI evaluation;Consider esophageal assessment (Dietician consult) Oral Care Recommendations: Oral care BID;Staff/trained caregiver to provide oral care   Follow up Recommendations   (TBD)    Frequency and Duration min 2x/week   1 week       Prognosis Prognosis for Safe Diet Advancement: Fair      Swallow Study   General Date of Onset: 05/02/16 HPI: Pt is a 82 y.o. male with a known history of CAD, COPD, hyperlipidemia comes in the emergency room after he had a fall, mechanical and home while trying to walk on the carpet and slipped on the tile floor. Patient started having significant left hip pain came to the emergency room was found to have left femur fracture.This was surgicaly repaired, however, pt developed post-op ileus. Of note, ~4 years ago, pt had Esophagogastroduodenoscopy which showed severe erosive gastritis with multiple nonobstructing nonbleeding cratered gastric ulcers of moderate to significant severity with no stigmata of bleeding. Furthermore, the largest lesion was about 7 mm. The patient was also known to have a nonobstructing widely patent Schatzki ring. Pt c/o phlegm and other Reflux-like s/s at home prior to admission. He denied taking any PPIs but was noted to be d/c on Protonix 40 mg 2x daily after the EGD dx in 2013. Pt denied f/u w/ GI at home. He has had an NG in place post surgery d/t ileus. NG was removed this AM. Pt is on a clear liquid diet; NSG had noted mild coughing intermittently and phlegm. Type of Study: Bedside Swallow Evaluation Previous Swallow Assessment: none Diet Prior to this Study: Thin liquids (clears during this admission post surgery; regular at home) Temperature Spikes Noted: No Respiratory Status: Room air History of Recent Intubation: No Behavior/Cognition: Alert;Cooperative;Pleasant mood Oral Cavity Assessment: Within Functional Limits Oral Care Completed by SLP: Recent completion by staff Oral Cavity - Dentition: Edentulous (does not have dentures that fit; is seeking appt for such) Vision: Functional for self-feeding Self-Feeding Abilities: Able to feed self;Needs assist;Needs set up Patient Positioning: Upright in bed Baseline Vocal Quality:  Normal Volitional Cough: Strong Volitional Swallow: Able to elicit    Oral/Motor/Sensory Function Overall Oral Motor/Sensory Function: Within functional limits   Ice Chips Ice chips: Within functional limits Presentation: Spoon (fed; 3 trials)   Thin Liquid Thin Liquid: Impaired Presentation: Cup;Self Fed (small, single sips - 7 trials) Oral Phase Impairments:  (none) Oral Phase Functional Implications:  (none) Pharyngeal  Phase Impairments: Multiple swallows;Throat Clearing - Delayed (x1) Other Comments: coughed and cleared phlegm post trials    Nectar Thick Nectar Thick Liquid: Not tested   Honey Thick Honey Thick Liquid: Not tested   Puree Puree: Within functional limits Presentation: Self Fed;Spoon (8-10 trials)   Solid   GO   Solid: Within functional limits (moistened, broken down - 2 trials) Presentation: Self Fed;Spoon Other Comments: such consistency d/t edentulous status       Orinda Kenner, MS, CCC-SLP  Angeleah Labrake 05/12/2016,5:03 PM

## 2016-05-12 NOTE — Progress Notes (Signed)
[place] [facility] [place] NAME: Jeffrey Haynes    MR#:  [id number]  DATE OF BIRTH:  Feb 18, 1925  SUBJECTIVE:   Had 2 loose BM's this a.m. Tolerating Clear liquid diets well. NG tube has been removed this morning. Abdomen soft. No nausea or vomiting overnight presently.  REVIEW OF SYSTEMS:    Review of Systems  Constitutional: Negative for fever and chills.  HENT: Negative for congestion and tinnitus.   Eyes: Negative for blurred vision and double vision.  Respiratory: Negative for cough, shortness of breath and wheezing.   Cardiovascular: Negative for chest pain, orthopnea and PND.  Gastrointestinal: Negative for nausea, vomiting, abdominal pain, diarrhea and constipation.  Genitourinary: Negative for dysuria and hematuria.  Neurological: Positive for weakness (generalized). Negative for dizziness, sensory change and focal weakness.  All other systems reviewed and are negative.   Nutrition: Pured with thin liquids.  Tolerating Diet: Yes Tolerating PT: Eval noted.     DRUG ALLERGIES:  No Known Allergies  VITALS:  Blood pressure 138/47, pulse 82, temperature 98 F (36.7 C), temperature source Oral, resp. rate 18, height '5\' 9"'$  (1.753 m), weight 94.847 kg (209 lb 1.6 oz), SpO2 94 %.  PHYSICAL EXAMINATION:   Physical Exam  GENERAL:  82 y.o.-year-old patient lying in the bed in no acute distress.  EYES: Pupils equal, round, reactive to light and accommodation. No scleral icterus. Extraocular muscles intact.  HEENT: Head atraumatic, normocephalic. Oropharynx and nasopharynx clear. NG tube in place.  NECK:  Supple, no jugular venous distention. No thyroid enlargement, no tenderness.  LUNGS: Normal breath sounds bilaterally, no wheezing, rales, rhonchi. No use of accessory muscles of respiration.  CARDIOVASCULAR: S1, S2 normal. No murmurs, rubs, or gallops.  ABDOMEN: Soft, nontender, nondistended,  + BS. No organomegaly or mass.  EXTREMITIES:  No cyanosis, clubbing or edema b/l.    NEUROLOGIC: Cranial nerves II through XII are intact. No focal Motor or sensory deficits b/l.  Globally weak PSYCHIATRIC: The patient is alert and oriented x 3.  SKIN: No obvious rash, lesion, or ulcer.    LABORATORY PANEL:   CBC  Recent Labs Lab 05/10/16 0358  HGB 10.9*   ------------------------------------------------------------------------------------------------------------------  Chemistries   Recent Labs Lab 05/12/16 0430  NA 138  K 3.1*  CL 110  CO2 21*  GLUCOSE 95  BUN 9  CREATININE 0.67  CALCIUM 7.7*   ------------------------------------------------------------------------------------------------------------------  Cardiac Enzymes No results for input(s): TROPONINI in the last 168 hours. ------------------------------------------------------------------------------------------------------------------  RADIOLOGY:  Dg Abd 1 View  05/11/2016  CLINICAL DATA:  Postop ileus EXAM: ABDOMEN - 1 VIEW COMPARISON:  05/10/2016 FINDINGS: Continued gaseous distention of the colon, no significant change. No organomegaly or free air. NG tube tip is in the distal stomach, stable. No acute bony abnormality. Degenerative changes in the lumbar spine. Prior low left hip replacement. IMPRESSION: Continued mild gaseous distention of the colon, likely adynamic ileus. Electronically Signed   By: Rolm Baptise M.D.   On: 05/11/2016 07:51     ASSESSMENT AND PLAN:   82 year old male with past medical history of COPD, coronary artery disease, hyperlipidemia, peptic ulcer disease, paroxysmal atrial fibrillation, hypothyroidism who presented to the hospital after a fall and noted to have a left femur fracture. Postoperatively patient has had complications with A. fib with RVR, colonic ileus.  1. Ileus/postoperative - Much improved. NG tube has been removed. Abdomen soft, nondistended. -No nausea or vomiting. Tolerating a clear liquid diet and now  advanced to a  pured diet as per speech. -Off laxatives for now. Continue supportive care for now.  2. History of atrial fibrillation with rapid ventricular response-much improved and was on amiodarone drip which has now been discontinued. -Continue metoprolol, oral amiodarone. - off tele.   3. Left hip fracture-status post hemiarthroplasty on 05/03/2016. -Continue pain control as per orthopedics. Tolerating physical therapy well. We'll discharge to skilled nursing facility in the next 1-2 days.  4. Leukocytosis-this was reactive in nature now resolved.  5. COPD without exacerbation-cont. Albuterol nebs.   - stable.   6. Hypothyroidism - cont. Synthroid.   7. GERD - cont. Protonix.   8. Hypokalemia - placed on supplements and repeat level in a.m.  - check Mg. level.    All the records are reviewed and case discussed with Care Management/Social Workerr. Management plans discussed with the patient, family and they are in agreement.  CODE STATUS: Full  DVT Prophylaxis: Lovenox  TOTAL TIME TAKING CARE OF THIS PATIENT: 30 minutes.   POSSIBLE D/C IN 1-2 DAYS, DEPENDING ON CLINICAL CONDITION.   Henreitta Leber M.D on 05/12/2016 at 2:41 PM  Between 7am to 6pm - Pager - [phone]  After 6pm go to [email or web address] - password [email or web address] [facility]  Office  [phone]  CC: Primary care physician; Golden Pop, MD

## 2016-05-12 NOTE — Progress Notes (Signed)
Dr Verdell Carmine notified about potassium orders 20 meq at 8, 9, and 10am. Orders to only give 2 of those doses and to discard there other. No n/v through the night. Orders given to remove NG tube. Patient updated and informed. Continue to monitor.

## 2016-05-12 NOTE — Care Management Note (Signed)
Case Management Note  Patient Details  Name: Jeffrey Haynes MRN: [id number] Date of Birth: [birth year]  Subjective/Objective:      Discussed discharge planning with CSW. Jeffrey Haynes has a bed waiting at [facility]. Speech is providing a swallow evaluation.               Action/Plan:   Expected Discharge Date:  05/05/16               Expected Discharge Plan:     In-House Referral:     Discharge planning Services     Post Acute Care Choice:    Choice offered to:     DME Arranged:    DME Agency:     HH Arranged:    HH Agency:     Status of Service:     Medicare Important Message Given:  Yes Date Medicare IM Given:    Medicare IM give by:    Date Additional Medicare IM Given:    Additional Medicare Important Message give by:     If discussed at [place] of Stay Meetings, dates discussed:    Additional Comments:  Darriona Dehaas A, RN 05/12/2016, 4:34 PM

## 2016-05-13 ENCOUNTER — Inpatient Hospital Stay: Payer: Medicare Other

## 2016-05-13 LAB — POTASSIUM: Potassium: 3.1 mmol/L — ABNORMAL LOW (ref 3.5–5.1)

## 2016-05-13 MED ORDER — POTASSIUM CHLORIDE 20 MEQ PO PACK
40.0000 meq | PACK | Freq: Three times a day (TID) | ORAL | Status: DC
  Administered 2016-05-13: 40 meq via ORAL
  Filled 2016-05-13: qty 2

## 2016-05-13 MED ORDER — HYDROCODONE-ACETAMINOPHEN 5-325 MG PO TABS: 1.0000 | ORAL_TABLET | ORAL | Status: DC | PRN

## 2016-05-13 MED ORDER — DOCUSATE SODIUM 100 MG PO CAPS: 100.0000 mg | ORAL_CAPSULE | Freq: Two times a day (BID) | ORAL | Status: DC | PRN

## 2016-05-13 MED ORDER — POTASSIUM CHLORIDE ER 20 MEQ PO TBCR: 10.0000 meq | EXTENDED_RELEASE_TABLET | Freq: Two times a day (BID) | ORAL | Status: DC

## 2016-05-13 MED ORDER — ENOXAPARIN SODIUM 40 MG/0.4ML ~~LOC~~ SOLN: 40.0000 mg | SUBCUTANEOUS | Status: DC

## 2016-05-13 MED ORDER — AMIODARONE HCL 200 MG PO TABS: 200.0000 mg | ORAL_TABLET | Freq: Every day | ORAL | Status: DC

## 2016-05-13 MED ORDER — METHYLPREDNISOLONE 4 MG PO TBPK: ORAL_TABLET | ORAL | Status: DC

## 2016-05-13 NOTE — Progress Notes (Signed)
PT Cancellation Note  Patient Details Name: Jeffrey Haynes MRN: [id number] DOB: 1925-10-01   Cancelled Treatment:    Reason Eval/Treat Not Completed: Patient declined, no reason specified. Treatment attempted this morning. Pt refuses noting he is fatigued. Pt notes he has had a lot of activity this morning and is expecting to be discharged today. Pt notes he is performing bed exercises and recites most by memory. At the time of documentation, pt has current discharge orders to skilled nursing facility.    Charlaine Dalton, [place] 05/13/2016, 1:04 PM

## 2016-05-13 NOTE — Progress Notes (Signed)
Subjective: 11 Days Post-Op Procedure(s) (LRB): ARTHROPLASTY BIPOLAR HIP (HEMIARTHROPLASTY) (Left) Patient reports pain as mild. Patient having more pain in his right wrist this AM, denies injury Patient is well, and has had no acute complaints or problems. Pt suffering from post-op ileus. He is feeling better this morning, abdomen is much improved. Denies any CP or stomach pain. We will continue therapy today.  Plan for discharge today. Plan is to go Rehab after hospital stay.  Objective: Vital signs in last 24 hours: Temp:  [97.7 F (36.5 C)-98.8 F (37.1 C)] 97.7 F (36.5 C) (06/06 0718) Pulse Rate:  [73-77] 73 (06/06 0718) Resp:  [18] 18 (06/06 0718) BP: (124-139)/(47-57) 133/57 mmHg (06/06 0718) SpO2:  [92 %-97 %] 92 % (06/06 0729) Weight:  [94.666 kg (208 lb 11.2 oz)] 94.666 kg (208 lb 11.2 oz) (06/06 0404)  Intake/Output from previous day: 06/05 0701 - 06/06 0700 In: 240 [P.O.:240] Out: 0  Intake/Output this shift: Total I/O In: 120 [P.O.:120] Out: -   No results for input(s): HGB in the last 72 hours. No results for input(s): WBC, RBC, HCT, PLT in the last 72 hours.  Recent Labs  05/11/16 0413 05/12/16 0430 05/13/16 0528  NA 138 138  --   K 3.6 3.1* 3.1*  CL 110 110  --   CO2 17* 21*  --   BUN 14 9  --   CREATININE 0.81 0.67  --   GLUCOSE 67 95  --   CALCIUM 7.5* 7.7*  --    No results for input(s): LABPT, INR in the last 72 hours.  EXAM General - Patient is Alert, Appropriate and Oriented Extremity - Neurovascular intact Sensation intact distally Intact pulses distally Dorsiflexion/Plantar flexion intact Dressing - no drainage.  No erythema or drainage from the wound.  Staples removed from left hip incision. Motor Function - intact, moving foot and toes well on exam.  The patient was able to ambulate 52 feet with physical therapy yesterday.  Abdomen soft this AM without tympany.  Normal bowel sounds with auscultation.  Past Medical History   Diagnosis Date  . COPD (chronic obstructive pulmonary disease) ([place])   . CAD (coronary artery disease)   . Hyperlipidemia   . Familial tremor   . Bleeding ulcer 09/2012    a. gastric  . Orthostatic hypotension     a. leading to prior syncope  . Hypothyroidism   . Essential hypertension   . PAF (paroxysmal atrial fibrillation) ([facility])     a. in the post-op setting and associated with COPD exacerbation    Assessment/Plan:   11 Days Post-Op Procedure(s) (LRB): ARTHROPLASTY BIPOLAR HIP (HEMIARTHROPLASTY) (Left) Principal Problem:   Femur fracture ([facility]) Active Problems:   COPD (chronic obstructive pulmonary disease) ([place])   New onset atrial fibrillation ([facility])   Respiratory distress   Fall   Status post hip hemiarthroplasty  Estimated body mass index is 30.81 kg/(m^2) as calculated from the following:   Height as of this encounter: '5\' 9"'$  (1.753 m).   Weight as of this encounter: 94.666 kg (208 lb 11.2 oz). Advance diet Up with therapy  Plan on discharge to SNF once medically cleared Staples have been removed, follow in with [facility] in 4 weeks.  Post-op Ileus, much improved this morning.  Having BM and urinating.  Abdomen is soft this AM. K+ 3.1 this AM, will supplement with Klor-con packets.  Inpatient medicine discharging the patient today on supplements, will need to follow-up with PCP for follow-up of hypokalemia.  Continue Lovenox '40mg'$  daily for 14 days DVT prophylaxis. Will discharge patient with a right wrist splint and 6-day medrol dose pack for his right wrist.  DVT Prophylaxis - Lovenox, Foot Pumps and TED hose Weight-Bearing as tolerated to left leg  J. Cameron Proud, PA-C [place] 05/13/2016, 10:42 AM

## 2016-05-13 NOTE — Progress Notes (Signed)
Report called to [facility], Pleas Patricia RN. Pt will transfer via EMS. Family at bedside.  Splint placed to right wrist for comfort. No other complaints/questions.

## 2016-05-13 NOTE — Progress Notes (Signed)
Checked to see if pre-authorization would be needed for non-emergent EMS transport. Per [employer] benefits obtained online through [employer], patient has a [employer] Medicare Advantage PPO policy.  Medicare PPO plans do not require pre-auth for non-emergent ground transports using service codes A0426 or A0428.   

## 2016-05-13 NOTE — Discharge Summary (Addendum)
[facility] [place] NAME: Jeffrey Haynes    MR#:  [id number]  DATE OF BIRTH:  11/27/1925  DATE OF ADMISSION:  05/02/2016 ADMITTING PHYSICIAN: [name], MD  DATE OF DISCHARGE: 05/13/2016  PRIMARY CARE PHYSICIAN: Golden Pop, MD    ADMISSION DIAGNOSIS:  Fall, initial encounter [W19.XXXA] Femur fracture, left, closed, initial encounter [S72.92XA]  DISCHARGE DIAGNOSIS:  Principal Problem:   Femur fracture ([place]) Active Problems:   COPD (chronic obstructive pulmonary disease) ([place])   New onset atrial fibrillation ([facility])   Respiratory distress   Fall   Status post hip hemiarthroplasty   SECONDARY DIAGNOSIS:   Past Medical History  Diagnosis Date  . COPD (chronic obstructive pulmonary disease) ([place])   . CAD (coronary artery disease)   . Hyperlipidemia   . Familial tremor   . Bleeding ulcer 09/2012    a. gastric  . Orthostatic hypotension     a. leading to prior syncope  . Hypothyroidism   . Essential hypertension   . PAF (paroxysmal atrial fibrillation) ([facility])     a. in the post-op setting and associated with COPD exacerbation    HOSPITAL COURSE:   83 year old male with past medical history of COPD, coronary artery disease, hyperlipidemia, peptic ulcer disease, paroxysmal atrial fibrillation, hypothyroidism who presented to the hospital after a fall and noted to have a left femur fracture. Postoperatively patient has had complications with A. fib with RVR, colonic ileus.  1. Ileus/postoperative - Patient developed significant abdominal distention and obstipation postoperatively. He had to have an NG tube placed for decompression. He was given antiemetics and supportive care with IV fluids. -After supportive care and multiple laxatives including GoLYTELY he has had multiple BMs and his ileus has improved. He has had no nausea and vomiting over the past 48 hours. He is now tolerating a regular diet without any exacerbation of symptoms.    2. History of atrial fibrillation with rapid ventricular response-patient developed this postoperatively and had to be placed on amiodarone drip. He has not been weaned off the amiodarone drip. He will continue some oral amiodarone and metoprolol. He is currently rate controlled and hemodynamically stable.  3. Left hip fracture-status post hemiarthroplasty on 05/03/2016. -Patient is clinically doing well and tolerating physical therapy well. His pain is well-controlled on all meds. He is now being discharged to a skilled nursing facility for ongoing therapy.  4. Leukocytosis-this was reactive in nature and has now improved and resolved.  - no evidence of infectious source while in the hospital.   5. COPD without exacerbation- he will cont. His Combivent, albuterol inhaler.    6. Hypothyroidism - cont. Synthroid.   7. Hypokalemia - will place on oral Potassium supplements upon discharge.  - can be rechecked as outpatient.   DISCHARGE CONDITIONS:   Stable.   CONSULTS OBTAINED:  Treatment Team:  [name], MD  DRUG ALLERGIES:  No Known Allergies  DISCHARGE MEDICATIONS:   Current Discharge Medication List    START taking these medications   Details  amiodarone (PACERONE) 200 MG tablet Take 1 tablet (200 mg total) by mouth daily.    docusate sodium (COLACE) 100 MG capsule Take 1 capsule (100 mg total) by mouth 2 (two) times daily as needed for mild constipation. Qty: 10 capsule, Refills: 0    enoxaparin (LOVENOX) 40 MG/0.4ML injection Inject 0.4 mLs (40 mg total) into the skin daily. Qty: 0 Syringe    HYDROcodone-acetaminophen (NORCO/VICODIN) 5-325 MG tablet Take 1-2  tablets by mouth every 4 (four) hours as needed for moderate pain. Qty: 30 tablet, Refills: 0    potassium chloride 20 MEQ TBCR Take 10 mEq by mouth 2 (two) times daily.      CONTINUE these medications which have NOT CHANGED   Details  albuterol (PROVENTIL HFA;VENTOLIN HFA) 108 (90 BASE) MCG/ACT inhaler  Inhale 2 puffs into the lungs every 6 (six) hours as needed for wheezing or shortness of breath. Qty: 1 Inhaler, Refills: 2    atorvastatin (LIPITOR) 20 MG tablet Take 20 mg by mouth at bedtime.    Ipratropium-Albuterol (COMBIVENT) 20-100 MCG/ACT AERS respimat Inhale 2 puffs into the lungs 2 (two) times daily. Qty: 3 Inhaler, Refills: 4    levothyroxine (SYNTHROID, LEVOTHROID) 75 MCG tablet Take 1 tablet (75 mcg total) by mouth daily before breakfast. Qty: 90 tablet, Refills: 4    metoprolol succinate (TOPROL-XL) 25 MG 24 hr tablet Take 1 tablet (25 mg total) by mouth daily. Qty: 90 tablet, Refills: 4         DISCHARGE INSTRUCTIONS:   DIET:  Cardiac diet  DISCHARGE CONDITION:  Stable  ACTIVITY:  Activity as tolerated  OXYGEN:  Home Oxygen: No.   Oxygen Delivery: room air  DISCHARGE LOCATION:  nursing home   If you experience worsening of your admission symptoms, develop shortness of breath, life threatening emergency, suicidal or homicidal thoughts you must seek medical attention immediately by calling 911 or calling your MD immediately  if symptoms less severe.  You Must read complete instructions/literature along with all the possible adverse reactions/side effects for all the Medicines you take and that have been prescribed to you. Take any new Medicines after you have completely understood and accpet all the possible adverse reactions/side effects.   Please note  You were cared for by a hospitalist during your hospital stay. If you have any questions about your discharge medications or the care you received while you were in the hospital after you are discharged, you can call the unit and asked to speak with the hospitalist on call if the hospitalist that took care of you is not available. Once you are discharged, your primary care physician will handle any further medical issues. Please note that NO REFILLS for any discharge medications will be authorized once you are  discharged, as it is imperative that you return to your primary care physician (or establish a relationship with a primary care physician if you do not have one) for your aftercare needs so that they can reassess your need for medications and monitor your lab values.     Today   Complaining of some right wrist pain.  No abdominal pain, N/V.  Tolerating a regular diet well.    VITAL SIGNS:  Blood pressure 133/57, pulse 73, temperature 97.7 F (36.5 C), temperature source Oral, resp. rate 18, height '5\' 9"'$  (1.753 m), weight 94.666 kg (208 lb 11.2 oz), SpO2 92 %.  I/O:    Intake/Output Summary (Last 24 hours) at 05/13/16 0956 Last data filed at 05/12/16 1102  Gross per 24 hour  Intake      0 ml  Output      0 ml  Net      0 ml    PHYSICAL EXAMINATION:   GENERAL: 82 y.o.-year-old patient lying in the bed in no acute distress.  EYES: Pupils equal, round, reactive to light and accommodation. No scleral icterus. Extraocular muscles intact.  HEENT: Head atraumatic, normocephalic. Oropharynx and nasopharynx clear. NG tube  in place.  NECK: Supple, no jugular venous distention. No thyroid enlargement, no tenderness.  LUNGS: Normal breath sounds bilaterally, no wheezing, rales, rhonchi. No use of accessory muscles of respiration.  CARDIOVASCULAR: S1, S2 normal. No murmurs, rubs, or gallops.  ABDOMEN: Soft, nontender, nondistended, + BS. No organomegaly or mass.  EXTREMITIES: No cyanosis, clubbing or edema b/l.  NEUROLOGIC: Cranial nerves II through XII are intact. No focal Motor or sensory deficits b/l. Globally weak PSYCHIATRIC: The patient is alert and oriented x 3.  SKIN: No obvious rash, lesion, or ulcer.  DATA REVIEW:   CBC  Recent Labs Lab 05/10/16 0358  HGB 10.9*    Chemistries   Recent Labs Lab 05/12/16 0430 05/13/16 0528  NA 138  --   K 3.1* 3.1*  CL 110  --   CO2 21*  --   GLUCOSE 95  --   BUN 9  --   CREATININE 0.67  --   CALCIUM 7.7*  --      Cardiac Enzymes No results for input(s): TROPONINI in the last 168 hours.  Microbiology Results  Results for orders placed or performed during the hospital encounter of 05/02/16  MRSA PCR Screening     Status: None   Collection Time: 05/04/16 11:04 PM  Result Value Ref Range Status   MRSA by PCR NEGATIVE NEGATIVE Final    Comment:        The GeneXpert MRSA Assay (FDA approved for NASAL specimens only), is one component of a comprehensive MRSA colonization surveillance program. It is not intended to diagnose MRSA infection nor to guide or monitor treatment for MRSA infections.     RADIOLOGY:  No results found.    Management plans discussed with the patient, family and they are in agreement.  CODE STATUS:     Code Status Orders        Start     Ordered   05/03/16 0015  Full code   Continuous     05/03/16 0014    Code Status History    Date Active Date Inactive Code Status Order ID Comments User Context   This patient has a current code status but no historical code status.    Advance Directive Documentation        Most Recent Value   Type of Advance Directive  Healthcare Power of Attorney   Pre-existing out of facility DNR order (yellow form or pink MOST form)     "MOST" Form in Place?        TOTAL TIME TAKING CARE OF THIS PATIENT: 40 minutes.    Henreitta Leber M.D on 05/13/2016 at 9:56 AM  Between 7am to 6pm - Pager - [phone]  After 6pm go to [email or web address] - password [email or web address] [facility]  Office  [phone]  CC: Primary care physician; Golden Pop, MD

## 2016-05-14 NOTE — Clinical Social Work Note (Signed)
Pt ready for discharge today to [facility]. Facility received discharge information and ready to accept pt. Pt and son aware and agreeable to discharge plan. RN called report and EMS provided transportation. CSW is signing off as no further needs identified.   Darden Dates, MSW, LCSW  Clinical Social Worker  [phone]

## 2016-05-14 NOTE — Clinical Social Work Placement (Signed)
   CLINICAL SOCIAL WORK PLACEMENT  NOTE  Date:  05/14/2016  Patient Details  Name: Jeffrey Haynes MRN: [id number] Date of Birth: 03-17-1925  Clinical Social Work is seeking post-discharge placement for this patient at the [place] level of care (*CSW will initial, date and re-position this form in  chart as items are completed):  Yes   Patient/family provided with [facility] Work Department's list of facilities offering this level of care within the geographic area requested by the patient (or if unable, by the patient's family).  Yes   Patient/family informed of their freedom to choose among providers that offer the needed level of care, that participate in Medicare, Medicaid or managed care program needed by the patient, have an available bed and are willing to accept the patient.  Yes   Patient/family informed of [facility]'s ownership interest in [facility] and [facility], as well as of the fact that they are under no obligation to receive care at these facilities.  PASRR submitted to EDS on 05/07/16     PASRR number received on 05/07/16     Existing PASRR number confirmed on       FL2 transmitted to all facilities in geographic area requested by pt/family on 05/07/16     FL2 transmitted to all facilities within larger geographic area on       Patient informed that his/her managed care company has contracts with or will negotiate with certain facilities, including the following:        Yes   Patient/family informed of bed offers received.  Patient chooses bed at [facility]     Physician recommends and patient chooses bed at  [facility])    Patient to be transferred to [facility] on 05/14/16.  Patient to be transferred to facility by [facility]      Patient family notified on 05/14/16 of transfer.  Name of family member notified:  [name]     PHYSICIAN       Additional Comment:     _______________________________________________ Darden Dates, LCSW 05/14/2016, 4:35 PM

## 2016-05-16 ENCOUNTER — Telehealth: Payer: Self-pay | Admitting: Cardiovascular Disease

## 2016-05-16 NOTE — Telephone Encounter (Signed)
Left voicemail message for them to call back.

## 2016-05-16 NOTE — Telephone Encounter (Signed)
Daughter calling to let us know patient is having swelling in BLE ( more so on Left)  And she is concerned (thinks he should be using compression hose)  Gollan saw patient at [facility] for consult new afib  Patient d/c to [facility] for rehab   Pt c/o swelling: STAT is pt has developed SOB within 24 hours  1. How long have you been experiencing swelling? 5 days for R leg since fall for L leg  2. Where is the swelling located? BLE   3.  Are you currently taking a "fluid pill"  Not sure   4.  Are you currently SOB?  Weak   Cant tell if sob   5.  Have you traveled recently?  No,  [place] 05/02/16  And has been ambulating at rehab

## 2016-05-19 NOTE — Telephone Encounter (Signed)
Malachy Mood Returning call from [facility]

## 2016-05-19 NOTE — Telephone Encounter (Signed)
Left voicemail message for her to call back.  

## 2016-05-19 NOTE — Telephone Encounter (Signed)
Malachy Mood Returning call from [facility].

## 2016-05-19 NOTE — Telephone Encounter (Signed)
Patients daughter Malachy Mood called because her father had some extremity swelling. He is currently at [facility]. Let her know that we have not seen this patient in our office. I asked her if they were aware of the swelling and she stated that they were aware and ordered stockings and a diuretic. She thought the diuretic came from [place]. Let her know that they manage the medications there at the facility. She verbalized understanding and had no further questions at this time.

## 2016-05-22 ENCOUNTER — Inpatient Hospital Stay: Payer: Medicare Other | Admitting: Family Medicine

## 2016-06-13 ENCOUNTER — Telehealth: Payer: Self-pay | Admitting: Family Medicine

## 2016-06-13 ENCOUNTER — Ambulatory Visit (INDEPENDENT_AMBULATORY_CARE_PROVIDER_SITE_OTHER): Payer: Medicare Other | Admitting: Family Medicine

## 2016-06-13 ENCOUNTER — Encounter: Payer: Self-pay | Admitting: Family Medicine

## 2016-06-13 VITALS — BP 117/68 | HR 61 | Temp 98.1°F | Wt 173.0 lb

## 2016-06-13 DIAGNOSIS — J42 Unspecified chronic bronchitis: Secondary | ICD-10-CM | POA: Diagnosis not present

## 2016-06-13 DIAGNOSIS — I4891 Unspecified atrial fibrillation: Secondary | ICD-10-CM | POA: Diagnosis not present

## 2016-06-13 DIAGNOSIS — M10079 Idiopathic gout, unspecified ankle and foot: Secondary | ICD-10-CM | POA: Diagnosis not present

## 2016-06-13 DIAGNOSIS — S7292XP Unspecified fracture of left femur, subsequent encounter for closed fracture with malunion: Secondary | ICD-10-CM

## 2016-06-13 DIAGNOSIS — D649 Anemia, unspecified: Secondary | ICD-10-CM

## 2016-06-13 DIAGNOSIS — M109 Gout, unspecified: Secondary | ICD-10-CM

## 2016-06-13 LAB — CBC WITH DIFFERENTIAL/PLATELET
Hematocrit: 36.3 % — ABNORMAL LOW (ref 37.5–51.0)
Hemoglobin: 12.2 g/dL — ABNORMAL LOW (ref 12.6–17.7)
Lymphocytes Absolute: 1.9 10*3/uL (ref 0.7–3.1)
Lymphs: 28 %
MCH: 32.8 pg (ref 26.6–33.0)
MCHC: 33.6 g/dL (ref 31.5–35.7)
MCV: 98 fL — ABNORMAL HIGH (ref 79–97)
MID (Absolute): 0.7 10*3/uL (ref 0.1–1.6)
MID: 11 %
NEUTROS ABS: 4 10*3/uL (ref 1.4–7.0)
Neutrophils: 61 %
Platelets: 336 10*3/uL (ref 150–379)
RBC: 3.72 x10E6/uL — ABNORMAL LOW (ref 4.14–5.80)
RDW: 15 % (ref 12.3–15.4)
WBC: 6.6 10*3/uL (ref 3.4–10.8)

## 2016-06-13 MED ORDER — FUROSEMIDE 20 MG PO TABS: 20.0000 mg | ORAL_TABLET | Freq: Every day | ORAL | Status: DC

## 2016-06-13 MED ORDER — APIXABAN 5 MG PO TABS: 5.0000 mg | ORAL_TABLET | Freq: Two times a day (BID) | ORAL | Status: DC

## 2016-06-13 MED ORDER — POTASSIUM CHLORIDE ER 20 MEQ PO TBCR: 10.0000 meq | EXTENDED_RELEASE_TABLET | Freq: Two times a day (BID) | ORAL | Status: DC

## 2016-06-13 NOTE — Telephone Encounter (Signed)
Genevieve from [facility] called stated she would like a verbal order for physical therapy for 1 week:1, 3 weeks:2, 2 weeks:3. Please call her at your earliest convenience. She also wanted to advise of a possible interaction between medications. Thanks.

## 2016-06-13 NOTE — Assessment & Plan Note (Signed)
Lungs clear today. Continue to monitor. Call with any concerns.

## 2016-06-13 NOTE — Progress Notes (Signed)
BP 117/68 mmHg  Pulse 61  Temp(Src) 98.1 F (36.7 C)  Wt 173 lb (78.472 kg)  SpO2 94%   Subjective:    Patient ID: Jeffrey Haynes, male    DOB: 1925/07/08, 82 y.o.   MRN: [id number]  HPI: Jeffrey Haynes is a 82 y.o. male  Chief Complaint  Patient presents with  . Discharged from [facility]    follow up   [facility] Time since discharge: 1 month, out of rehab on Tuesday Hospital/facility: [facility] Diagnosis: fractured L Hip s/p arthroplasty; post-op ileus, New onset A-fib (no need for anti-coag), post-hemorrhagic anemia, constipated, COPD exacerbation Procedures/tests: L hip surgery, NG tube Consultants: Ortho (Dr. Roland Rack), cardiology (Dr. Rockey Situ) New medications: lovenox, IV amiodarone Discharge instructions:  Rehab, follow up with Dr. Roland Rack- seeing him on the 7/17, [place] and here Status: better Did OK at rehab. Diagnosed with gout the first week that he was there- started on medicine? But not on anything now. Saw GI doctor at the end of May- GERD and bleeding ulcers and constipation again. Now on stool softeners and miralax and is now cleared up. Had a lot of swelling while he was over there and was on a diuretic, not on anything now.   Relevant past medical, surgical, family and social history reviewed and updated as indicated. Interim medical history since our last visit reviewed. Allergies and medications reviewed and updated.  Review of Systems  Constitutional: Negative.   Respiratory: Negative.   Cardiovascular: Negative.   Gastrointestinal: Negative.   Musculoskeletal: Negative.   Psychiatric/Behavioral: Negative.     Per HPI unless specifically indicated above     Objective:    BP 117/68 mmHg  Pulse 61  Temp(Src) 98.1 F (36.7 C)  Wt 173 lb (78.472 kg)  SpO2 94%  Wt Readings from Last 3 Encounters:  06/13/16 173 lb (78.472 kg)  05/13/16 208 lb 11.2 oz (94.666 kg)  03/25/16 189 lb (85.73 kg)    Physical Exam   Constitutional: He is oriented to person, place, and time. He appears well-developed and well-nourished. No distress.  HENT:  Head: Normocephalic and atraumatic.  Right Ear: Hearing normal.  Left Ear: Hearing normal.  Nose: Nose normal.  Eyes: Conjunctivae and lids are normal. Right eye exhibits no discharge. Left eye exhibits no discharge. No scleral icterus.  Cardiovascular: Normal rate, regular rhythm, normal heart sounds and intact distal pulses.  Exam reveals no gallop and no friction rub.   No murmur heard. Pulmonary/Chest: Effort normal and breath sounds normal. No respiratory distress. He has no wheezes. He has no rales. He exhibits no tenderness.  Musculoskeletal: Normal range of motion.  Neurological: He is alert and oriented to person, place, and time.  Skin: Skin is warm, dry and intact. No rash noted. No erythema. No pallor.  Psychiatric: He has a normal mood and affect. His speech is normal and behavior is normal. Judgment and thought content normal. Cognition and memory are normal.  Nursing note and vitals reviewed.   Results for orders placed or performed during the hospital encounter of 05/02/16  MRSA PCR Screening  Result Value Ref Range   MRSA by PCR NEGATIVE NEGATIVE  Comprehensive metabolic panel  Result Value Ref Range   Sodium 137 135 - 145 mmol/L   Potassium 5.2 (H) 3.5 - 5.1 mmol/L   Chloride 106 101 - 111 mmol/L   CO2 24 22 - 32 mmol/L   Glucose, Bld 98 65 - 99 mg/dL  BUN 15 6 - 20 mg/dL   Creatinine, Ser 0.95 0.61 - 1.24 mg/dL   Calcium 9.0 8.9 - 10.3 mg/dL   Total Protein 6.8 6.5 - 8.1 g/dL   Albumin 3.7 3.5 - 5.0 g/dL   AST 25 15 - 41 U/L   ALT 11 (L) 17 - 63 U/L   Alkaline Phosphatase 80 38 - 126 U/L   Total Bilirubin 1.5 (H) 0.3 - 1.2 mg/dL   GFR calc non Af Amer >60 >60 mL/min   GFR calc Af Amer >60 >60 mL/min   Anion gap 7 5 - 15  CBC with Differential  Result Value Ref Range   WBC 11.0 (H) 3.8 - 10.6 K/uL   RBC 4.40 4.40 - 5.90 MIL/uL    Hemoglobin 14.1 13.0 - 18.0 g/dL   HCT 41.6 40.0 - 52.0 %   MCV 94.4 80.0 - 100.0 fL   MCH 32.0 26.0 - 34.0 pg   MCHC 34.0 32.0 - 36.0 g/dL   RDW 14.1 11.5 - 14.5 %   Platelets 186 150 - 440 K/uL   Neutrophils Relative % 76% %   Neutro Abs 8.3 (H) 1.4 - 6.5 K/uL   Lymphocytes Relative 16% %   Lymphs Abs 1.8 1.0 - 3.6 K/uL   Monocytes Relative 7% %   Monocytes Absolute 0.8 0.2 - 1.0 K/uL   Eosinophils Relative 1% %   Eosinophils Absolute 0.1 0 - 0.7 K/uL   Basophils Relative 0% %   Basophils Absolute 0.0 0 - 0.1 K/uL  Basic metabolic panel  Result Value Ref Range   Sodium 138 135 - 145 mmol/L   Potassium 4.1 3.5 - 5.1 mmol/L   Chloride 109 101 - 111 mmol/L   CO2 25 22 - 32 mmol/L   Glucose, Bld 103 (H) 65 - 99 mg/dL   BUN 13 6 - 20 mg/dL   Creatinine, Ser 0.91 0.61 - 1.24 mg/dL   Calcium 7.9 (L) 8.9 - 10.3 mg/dL   GFR calc non Af Amer >60 >60 mL/min   GFR calc Af Amer >60 >60 mL/min   Anion gap 4 (L) 5 - 15  CBC with Differential/Platelet  Result Value Ref Range   WBC 9.6 3.8 - 10.6 K/uL   RBC 3.79 (L) 4.40 - 5.90 MIL/uL   Hemoglobin 12.3 (L) 13.0 - 18.0 g/dL   HCT 36.9 (L) 40.0 - 52.0 %   MCV 97.3 80.0 - 100.0 fL   MCH 32.5 26.0 - 34.0 pg   MCHC 33.4 32.0 - 36.0 g/dL   RDW 13.5 11.5 - 14.5 %   Platelets 161 150 - 440 K/uL   Neutrophils Relative % 79% %   Neutro Abs 7.6 (H) 1.4 - 6.5 K/uL   Lymphocytes Relative 12% %   Lymphs Abs 1.2 1.0 - 3.6 K/uL   Monocytes Relative 8% %   Monocytes Absolute 0.7 0.2 - 1.0 K/uL   Eosinophils Relative 1% %   Eosinophils Absolute 0.1 0 - 0.7 K/uL   Basophils Relative 0% %   Basophils Absolute 0.0 0 - 0.1 K/uL  Basic metabolic panel  Result Value Ref Range   Sodium 134 (L) 135 - 145 mmol/L   Potassium 4.2 3.5 - 5.1 mmol/L   Chloride 102 101 - 111 mmol/L   CO2 27 22 - 32 mmol/L   Glucose, Bld 110 (H) 65 - 99 mg/dL   BUN 11 6 - 20 mg/dL   Creatinine, Ser 1.11 0.61 - 1.24 mg/dL  Calcium 8.1 (L) 8.9 - 10.3 mg/dL   GFR calc non  Af Amer 57 (L) >60 mL/min   GFR calc Af Amer >60 >60 mL/min   Anion gap 5 5 - 15  Basic metabolic panel  Result Value Ref Range   Sodium 134 (L) 135 - 145 mmol/L   Potassium 4.0 3.5 - 5.1 mmol/L   Chloride 105 101 - 111 mmol/L   CO2 25 22 - 32 mmol/L   Glucose, Bld 136 (H) 65 - 99 mg/dL   BUN 12 6 - 20 mg/dL   Creatinine, Ser 0.93 0.61 - 1.24 mg/dL   Calcium 7.8 (L) 8.9 - 10.3 mg/dL   GFR calc non Af Amer >60 >60 mL/min   GFR calc Af Amer >60 >60 mL/min   Anion gap 4 (L) 5 - 15  Hemoglobin  Result Value Ref Range   Hemoglobin 12.1 (L) 13.0 - 18.0 g/dL  Troponin I  Result Value Ref Range   Troponin I <0.03 <0.031 ng/mL  Sodium  Result Value Ref Range   Sodium 132 (L) 135 - 145 mmol/L  TSH  Result Value Ref Range   TSH 1.610 0.350 - 4.500 uIU/mL  Glucose, capillary  Result Value Ref Range   Glucose-Capillary 154 (H) 65 - 99 mg/dL  Sodium  Result Value Ref Range   Sodium 134 (L) 135 - 145 mmol/L  Potassium  Result Value Ref Range   Potassium 4.0 3.5 - 5.1 mmol/L  Creatinine, serum  Result Value Ref Range   Creatinine, Ser 0.93 0.61 - 1.24 mg/dL   GFR calc non Af Amer >60 >60 mL/min   GFR calc Af Amer >60 >60 mL/min  Creatinine, serum  Result Value Ref Range   Creatinine, Ser 0.87 0.61 - 1.24 mg/dL   GFR calc non Af Amer >60 >60 mL/min   GFR calc Af Amer >60 >60 mL/min  Hemoglobin  Result Value Ref Range   Hemoglobin 10.2 (L) 13.0 - 18.0 g/dL  Sodium  Result Value Ref Range   Sodium 136 135 - 145 mmol/L  Hemoglobin  Result Value Ref Range   Hemoglobin 10.9 (L) 13.0 - 18.0 g/dL  Basic metabolic panel  Result Value Ref Range   Sodium 138 135 - 145 mmol/L   Potassium 3.6 3.5 - 5.1 mmol/L   Chloride 110 101 - 111 mmol/L   CO2 17 (L) 22 - 32 mmol/L   Glucose, Bld 67 65 - 99 mg/dL   BUN 14 6 - 20 mg/dL   Creatinine, Ser 0.81 0.61 - 1.24 mg/dL   Calcium 7.5 (L) 8.9 - 10.3 mg/dL   GFR calc non Af Amer >60 >60 mL/min   GFR calc Af Amer >60 >60 mL/min   Anion gap  11 5 - 15  Glucose, capillary  Result Value Ref Range   Glucose-Capillary 59 (L) 65 - 99 mg/dL   Comment 1 Notify RN   Glucose, capillary  Result Value Ref Range   Glucose-Capillary 132 (H) 65 - 99 mg/dL   Comment 1 Notify RN   Basic metabolic panel  Result Value Ref Range   Sodium 138 135 - 145 mmol/L   Potassium 3.1 (L) 3.5 - 5.1 mmol/L   Chloride 110 101 - 111 mmol/L   CO2 21 (L) 22 - 32 mmol/L   Glucose, Bld 95 65 - 99 mg/dL   BUN 9 6 - 20 mg/dL   Creatinine, Ser 0.67 0.61 - 1.24 mg/dL   Calcium 7.7 (L) 8.9 -  10.3 mg/dL   GFR calc non Af Amer >60 >60 mL/min   GFR calc Af Amer >60 >60 mL/min   Anion gap 7 5 - 15  Potassium  Result Value Ref Range   Potassium 3.1 (L) 3.5 - 5.1 mmol/L  ECHO COMPLETE  Result Value Ref Range   Weight 3199.32 oz   Height 69 in   BP 152/70 mmHg  Surgical pathology  Result Value Ref Range   SURGICAL PATHOLOGY      Surgical Pathology CASE: [id number] PATIENT: Jenelle Mages Surgical Pathology Report     SPECIMEN SUBMITTED: A. Femoral head, left  CLINICAL HISTORY: None provided  PRE-OPERATIVE DIAGNOSIS: Left femoral head fracture  POST-OPERATIVE DIAGNOSIS: Same as pre-op     DIAGNOSIS: A. FEMORAL HEAD, LEFT; HEMIARTHROPLASTY: - BONE AND STROMA WITH HEMORRHAGE, COMPATIBLE WITH PROVIDED HISTORY OF FRACTURE. - NEGATIVE FOR MALIGNANCY.   GROSS DESCRIPTION:  A. Labeled: left femoral head  Size of specimen:      Head -5.2 x 5.3 cm      Neck -shaggy, 4.5 x 5.2 cm      Additional tissue: bony fragments, 3.2 x 2.5 x 2.6 cm  Articular surface: tan to brown focally chalky granular  Cut surface: tan to brown  Other findings: none noted  Block summary: 1 - representative section(s), post decalcification 2 - representative section(s), post decalcification    Final Diagnosis performed by Quay Burow, MD.  Electronically signed 05/08/2016 9:03:58AM    The ele ctronic signature indicates that the named Attending  Pathologist has evaluated the specimen  Technical component performed at [facility], [address] [place], [place] [address] Lab: [phone] Dir: Darrick Penna. Evette Doffing, MD  Professional component performed at [facility], [facility], [place], [place], [place] [address] Lab: [phone] Dir: Dellia Nims. Reuel Derby, MD        Assessment & Plan:   Problem List Items Addressed This Visit      Cardiovascular and Mediastinum   New onset atrial fibrillation (HCC)    In sinus rhythm today. Needs follow up with cardiology today to see if he needs to continue with amiodarone and eliquis. Referral generated today and they will call to get him in ASAP. Refill of eliquis made today. Call with concerns.       Relevant Medications   furosemide (LASIX) 20 MG tablet   apixaban (ELIQUIS) 5 MG TABS tablet   Other Relevant Orders   Ambulatory referral to Cardiology   Comprehensive metabolic panel     Respiratory   COPD (chronic obstructive pulmonary disease) (HCC)    Lungs clear today. Continue to monitor. Call with any concerns.       Relevant Orders   Comprehensive metabolic panel     Musculoskeletal and Integument   Femur fracture ([place])    Healing well. Following up with Dr. Roland Rack on 06/23/16. Continue to monitor. Call with any problems.       Relevant Orders   Ambulatory referral to Orthopedic Surgery    Other Visit Diagnoses    Postoperative anemia    -  Primary    CBC normal today. Continue to monitor. Continue protonix.     Relevant Orders    CBC With Differential/Platelet    Gout of foot, unspecified cause, unspecified chronicity, unspecified laterality        Unclear if this is what he had. Will check uric acid and await results.     Relevant Orders    Uric acid    Comprehensive metabolic  panel        Follow up plan: Return ASAP for regular follow up with [facility].

## 2016-06-13 NOTE — Assessment & Plan Note (Signed)
Healing well. Following up with Dr. Roland Rack on 06/23/16. Continue to monitor. Call with any problems.

## 2016-06-13 NOTE — Assessment & Plan Note (Signed)
In sinus rhythm today. Needs follow up with cardiology today to see if he needs to continue with amiodarone and eliquis. Referral generated today and they will call to get him in ASAP. Refill of eliquis made today. Call with concerns.

## 2016-06-14 LAB — COMPREHENSIVE METABOLIC PANEL
A/G RATIO: 1.1 — AB (ref 1.2–2.2)
ALBUMIN: 3.2 g/dL — AB (ref 3.5–4.7)
ALT: 11 IU/L (ref 0–44)
AST: 9 IU/L (ref 0–40)
Alkaline Phosphatase: 128 IU/L — ABNORMAL HIGH (ref 39–117)
BILIRUBIN TOTAL: 0.5 mg/dL (ref 0.0–1.2)
BUN / CREAT RATIO: 14 (ref 10–24)
BUN: 18 mg/dL (ref 8–27)
CHLORIDE: 99 mmol/L (ref 96–106)
CO2: 22 mmol/L (ref 18–29)
Calcium: 8.8 mg/dL (ref 8.6–10.2)
Creatinine, Ser: 1.33 mg/dL — ABNORMAL HIGH (ref 0.76–1.27)
GFR calc non Af Amer: 47 mL/min/{1.73_m2} — ABNORMAL LOW (ref >59)
GFR, EST AFRICAN AMERICAN: 54 mL/min/{1.73_m2} — AB (ref >59)
Globulin, Total: 3 g/dL (ref 1.5–4.5)
Glucose: 99 mg/dL (ref 65–99)
POTASSIUM: 4.7 mmol/L (ref 3.5–5.2)
Sodium: 138 mmol/L (ref 134–144)
TOTAL PROTEIN: 6.2 g/dL (ref 6.0–8.5)

## 2016-06-14 LAB — URIC ACID: Uric Acid: 5.4 mg/dL (ref 3.7–8.6)

## 2016-06-16 ENCOUNTER — Telehealth: Payer: Self-pay | Admitting: Cardiovascular Disease

## 2016-06-16 ENCOUNTER — Telehealth: Payer: Self-pay

## 2016-06-16 ENCOUNTER — Telehealth: Payer: Self-pay | Admitting: Family Medicine

## 2016-06-16 DIAGNOSIS — N289 Disorder of kidney and ureter, unspecified: Secondary | ICD-10-CM

## 2016-06-16 NOTE — Telephone Encounter (Signed)
Please let him know that his kidney function has gotten much worse. This is probably from being dehydrated, but I'd like him to start drinking a whole bunch of water and come back in just for a lab check this week to make sure it comes back down. If it doesn't we may stop his lasix before he sees Dr. Rockey Situ. Thanks!

## 2016-06-16 NOTE — Telephone Encounter (Signed)
Asked Jeffrey Haynes to call back with details of which medications may have an interaction

## 2016-06-16 NOTE — Telephone Encounter (Signed)
Gave verbal and addressed interactions

## 2016-06-16 NOTE — Telephone Encounter (Signed)
Called Jeffrey Haynes back to request details of what medications may have interactions so message can be forwarded to provider.

## 2016-06-16 NOTE — Telephone Encounter (Signed)
I don't write his amiodarone, they will need to check with Dr. Rockey Situ on that, his cardiologist.  He shouldn't be taking the zofran every day, so that's fine I'm not concerned about the combivent and the kdur.   Verbal for PT is fine.

## 2016-06-16 NOTE — Telephone Encounter (Signed)
Spoke with patient, he does not hear well, son Octavia Bruckner was with him but not ok to give info to.  They agreed to come in later this week for bloodwork, and patient will sign DPR

## 2016-06-16 NOTE — Telephone Encounter (Signed)
Possible interactions between  Combivent and Kdur Amioderone and Lipitor Toprolol and Zofran  Also needs verbal for PT schedule, see original call

## 2016-06-16 NOTE — Telephone Encounter (Signed)
Home health phyiscal therapist Barbee Cough  Calling to let us know about a medication interaction  Cordarone and Lipitor  Would like to know what needs to be done now.

## 2016-06-17 NOTE — Telephone Encounter (Signed)
Patients physical therapist Benson Setting in to see patient at his home and wanted to check regarding interaction of cordarone and lipitor. Instructed her to check with patients primary care physician because he has not been seen by anyone here in our office yet. Patient does have an appointment in August with Ignacia Bayley NP. She verbalized understanding and had no further questions at this time.

## 2016-06-18 ENCOUNTER — Other Ambulatory Visit: Payer: Medicare Other

## 2016-06-18 DIAGNOSIS — N289 Disorder of kidney and ureter, unspecified: Secondary | ICD-10-CM

## 2016-06-19 ENCOUNTER — Telehealth: Payer: Self-pay | Admitting: Family Medicine

## 2016-06-19 LAB — BASIC METABOLIC PANEL
BUN/Creatinine Ratio: 12 (ref 10–24)
BUN: 15 mg/dL (ref 8–27)
CO2: 24 mmol/L (ref 18–29)
CREATININE: 1.21 mg/dL (ref 0.76–1.27)
Calcium: 9.2 mg/dL (ref 8.6–10.2)
Chloride: 100 mmol/L (ref 96–106)
GFR, EST AFRICAN AMERICAN: 61 mL/min/{1.73_m2} (ref >59)
GFR, EST NON AFRICAN AMERICAN: 53 mL/min/{1.73_m2} — AB (ref >59)
Glucose: 113 mg/dL — ABNORMAL HIGH (ref 65–99)
Potassium: 4.4 mmol/L (ref 3.5–5.2)
SODIUM: 139 mmol/L (ref 134–144)

## 2016-06-19 NOTE — Telephone Encounter (Signed)
Please let his daughter know that his labs came back better, but still stay off the lasix and I'll fill out that DMV form once they drop it off. Thanks!

## 2016-06-19 NOTE — Telephone Encounter (Signed)
Spoke with patient's daughter Manuela Schwartz, she understands.  She also mentioned that she forgot to show a rash patient has on his chest and abdomen (itching) x a "couple of weeks" Has gotten some better with cortisone cream.  Also is concerned about negative attitude and would like to consider something for anxiety.  Patient does have appointment with PCP ([facility]) on 07/14/16

## 2016-07-14 ENCOUNTER — Ambulatory Visit (INDEPENDENT_AMBULATORY_CARE_PROVIDER_SITE_OTHER): Payer: Medicare Other | Admitting: Family Medicine

## 2016-07-14 ENCOUNTER — Encounter: Payer: Self-pay | Admitting: Family Medicine

## 2016-07-14 VITALS — BP 104/62 | HR 58 | Temp 97.9°F | Wt 177.0 lb

## 2016-07-14 DIAGNOSIS — S20211A Contusion of right front wall of thorax, initial encounter: Secondary | ICD-10-CM

## 2016-07-14 DIAGNOSIS — W19XXXD Unspecified fall, subsequent encounter: Secondary | ICD-10-CM

## 2016-07-14 DIAGNOSIS — I4891 Unspecified atrial fibrillation: Secondary | ICD-10-CM

## 2016-07-14 MED ORDER — APIXABAN 5 MG PO TABS: 5.0000 mg | ORAL_TABLET | Freq: Two times a day (BID) | ORAL | 0 refills | Status: DC

## 2016-07-14 NOTE — Assessment & Plan Note (Signed)
Discuss atrial fibrillation Will continue blood thinners and medication until cardiology sees him next week

## 2016-07-14 NOTE — Assessment & Plan Note (Signed)
Discuss fall and fall prevention and care

## 2016-07-14 NOTE — Progress Notes (Signed)
BP 104/62 (BP Location: Left Arm, Patient Position: Sitting, Cuff Size: Normal)   Pulse (!) 58   Temp 97.9 F (36.6 C)   Wt 177 lb (80.3 kg) Comment: with shoes  SpO2 97%   BMI 26.14 kg/m    Subjective:    Patient ID: Jeffrey Haynes, male    DOB: Mar 27, 1925, 82 y.o.   MRN: [id number]  HPI: Jeffrey Haynes is a 82 y.o. male  Patient all in all doing well after femur fracture is graduated from [place] [facility] with physical therapy to using one cane. Has not had trouble with balance but fell 10 days ago forward with resulting pain in right lower anterior chest area and ribs. Yesterday in church sneezed which was very painful in this area. Has been able to eat okay Has been limited by moving or with resistance As been able to get out and eat lunch out and continued physical therapy. Orthopedics as been very pleased with progression Patient accompanied by his daughter who assists with history  Relevant past medical, surgical, family and social history reviewed and updated as indicated. Interim medical history since our last visit reviewed. Allergies and medications reviewed and updated.  Review of Systems  Constitutional: Negative for diaphoresis, fatigue and fever.  Respiratory: Negative.   Cardiovascular: Negative.     Per HPI unless specifically indicated above     Objective:    BP 104/62 (BP Location: Left Arm, Patient Position: Sitting, Cuff Size: Normal)   Pulse (!) 58   Temp 97.9 F (36.6 C)   Wt 177 lb (80.3 kg) Comment: with shoes  SpO2 97%   BMI 26.14 kg/m   Wt Readings from Last 3 Encounters:  07/14/16 177 lb (80.3 kg)  06/13/16 173 lb (78.5 kg)  05/13/16 208 lb 11.2 oz (94.7 kg)    Physical Exam  Constitutional: He is oriented to person, place, and time. He appears well-developed and well-nourished. No distress.  HENT:  Head: Normocephalic and atraumatic.  Right Ear: Hearing normal.  Left Ear: Hearing normal.  Nose: Nose normal.  Eyes: Conjunctivae and lids are  normal. Right eye exhibits no discharge. Left eye exhibits no discharge. No scleral icterus.  Cardiovascular: Normal rate, regular rhythm and normal heart sounds.   Pulmonary/Chest: Effort normal and breath sounds normal. No respiratory distress.  Chest wall with tenderness over right 10th rib area midclavicular line  Musculoskeletal: Normal range of motion.  Neurological: He is alert and oriented to person, place, and time.  Skin: Skin is intact. No rash noted.  Psychiatric: He has a normal mood and affect. His speech is normal and behavior is normal. Judgment and thought content normal. Cognition and memory are normal.    Results for orders placed or performed in visit on 61/95/09  Basic metabolic panel  Result Value Ref Range   Glucose 113 (H) 65 - 99 mg/dL   BUN 15 8 - 27 mg/dL   Creatinine, Ser 1.21 0.76 - 1.27 mg/dL   GFR calc non Af Amer 53 (L) >59 mL/min/1.73   GFR calc Af Amer 61 >59 mL/min/1.73   BUN/Creatinine Ratio 12 10 - 24   Sodium 139 134 - 144 mmol/L   Potassium 4.4 3.5 - 5.2 mmol/L   Chloride 100 96 - 106 mmol/L   CO2 24 18 - 29 mmol/L   Calcium 9.2 8.6 - 10.2 mg/dL      Assessment & Plan:   Problem List Items Addressed This Visit      Cardiovascular  and Mediastinum   New onset atrial fibrillation (HCC)    Discuss atrial fibrillation Will continue blood thinners and medication until cardiology sees him next week      Relevant Medications   apixaban (ELIQUIS) 5 MG TABS tablet     Other   Fall    Discuss fall and fall prevention and care       Other Visit Diagnoses    Chest wall contusion, right, initial encounter    -  Primary   Discuss possible rib fracture or bruising will observe discussed red flags       Follow up plan: Return in about 3 months (around 10/14/2016), or if symptoms worsen or fail to improve.

## 2016-07-22 ENCOUNTER — Encounter: Payer: Self-pay | Admitting: Nurse Practitioner

## 2016-07-22 ENCOUNTER — Ambulatory Visit (INDEPENDENT_AMBULATORY_CARE_PROVIDER_SITE_OTHER): Payer: Medicare Other | Admitting: Nurse Practitioner

## 2016-07-22 VITALS — BP 120/62 | HR 55 | Ht 69.0 in | Wt 177.5 lb

## 2016-07-22 DIAGNOSIS — I119 Hypertensive heart disease without heart failure: Secondary | ICD-10-CM | POA: Diagnosis not present

## 2016-07-22 DIAGNOSIS — I480 Paroxysmal atrial fibrillation: Secondary | ICD-10-CM | POA: Diagnosis not present

## 2016-07-22 DIAGNOSIS — K219 Gastro-esophageal reflux disease without esophagitis: Secondary | ICD-10-CM

## 2016-07-22 NOTE — Patient Instructions (Signed)
Medication Instructions:  Your physician recommends that you continue on your current medications as directed. Please refer to the Current Medication list given to you today.    Labwork: none  Testing/Procedures: none  Follow-Up: Your physician recommends that you schedule a follow-up appointment in: 2 months with Dr. Rockey Situ.    Any Other Special Instructions Will Be Listed Below (If Applicable). You may stop your amiodarone for one week to see if nausea resolves. Please notify our office if you discontinue it after the one week.      If you need a refill on your cardiac medications before your next appointment, please call your pharmacy.

## 2016-07-22 NOTE — Progress Notes (Signed)
Office Visit    Patient Name: Jeffrey Haynes Date of Encounter: 07/22/2016  Primary Care Provider:  Golden Pop, MD Primary Cardiologist:  Johnny Bridge, MD   Chief Complaint    82 year old male with recent hospitalization related to hip fracture and postoperative atrial fibrillation who presents for follow-up.  Past Medical History    Past Medical History:  Diagnosis Date  . Bleeding ulcer 09/2012   a. gastric  . CAD (coronary artery disease)    a. Reported hx.  [name] COPD (chronic obstructive pulmonary disease) ([place])   . Essential hypertension   . Familial tremor   . Hyperlipidemia   . Hypothyroidism   . Orthostatic hypotension    a. leading to prior syncope  . PAF (paroxysmal atrial fibrillation) (HCC)    a. in the post-op setting and associated with COPD exacerbation-->amio 200 daily & eliquis 5 bid.   Past Surgical History:  Procedure Laterality Date  . HERNIA REPAIR    . HIP ARTHROPLASTY Left 05/02/2016   Procedure: ARTHROPLASTY BIPOLAR HIP (HEMIARTHROPLASTY);  Surgeon: [name], MD;  Location: [facility] ORS;  Service: Orthopedics;  Laterality: Left;  . SPINE SURGERY  1975   LS disk    Allergies  No Known Allergies  History of Present Illness    82 year old male with prior history of COPD, hyperlipidemia, tremor, peptic ulcer disease with history of bleeding ulcers in 2013, syncope in the setting of orthostatic hypotension, hypertension, hypothyroidism, and reported history of coronary artery disease which has been medically managed. In May of this year, he was admitted following a mechanical fall which resulted in a left femoral neck fracture. He was treated with left hemiarthroplasty and postoperatively developed COPD exacerbation along with A. fib with RVR. He was placed on amiodarone therapy and subsequently discharged to rehabilitation. Is not clear based on notes, whether or not he was discharged in sinus rhythm or in A. fib. It looks like while at rehabilitation,  he was placed on eliquis therapy. He is unaware of any recurrence of A. fib. Since his discharge from rehabilitation, he has done reasonably well. He denies chest pain, dyspnea, PND, orthopnea, dizziness, syncope, edema, or palpitations. He did have a mechanical fall about a week ago, where he leaned forward to place his cane on his doorknob but Ms. judged how far the doorknob was an inactive leaning forward, continued forward and fell. He struck his chin and his chest. He has had some rib discomfort with deep breaths and has been evaluated by primary care. Conservative therapy has been recommended. He did not suffer any significant trauma or bleeding. Of note, his daughter points out that he has been having reflux symptoms since being placed on amiodarone and eliquis. He is now on a PPI.  Home Medications    Prior to Admission medications   Medication Sig Start Date End Date Taking? Authorizing Provider  albuterol (PROVENTIL HFA;VENTOLIN HFA) 108 (90 BASE) MCG/ACT inhaler Inhale 2 puffs into the lungs every 6 (six) hours as needed for wheezing or shortness of breath. 11/06/15  Yes [name], MD  amiodarone (PACERONE) 200 MG tablet Take 1 tablet (200 mg total) by mouth daily. 05/13/16  Yes Henreitta Leber, MD  apixaban (ELIQUIS) 5 MG TABS tablet Take 1 tablet (5 mg total) by mouth 2 (two) times daily. 07/14/16  Yes [name], MD  atorvastatin (LIPITOR) 20 MG tablet Take 20 mg by mouth at bedtime.   Yes Historical Provider, MD  fexofenadine Delma Freeze)  180 MG tablet Take 180 mg by mouth daily.   Yes Historical Provider, MD  fluticasone (FLONASE) 50 MCG/ACT nasal spray Place 1 spray into both nostrils daily.   Yes Historical Provider, MD  Ipratropium-Albuterol (COMBIVENT) 20-100 MCG/ACT AERS respimat Inhale 2 puffs into the lungs 2 (two) times daily. 11/06/15  Yes [name], MD  levothyroxine (SYNTHROID, LEVOTHROID) 75 MCG tablet Take 1 tablet (75 mcg total) by mouth daily before breakfast.  11/06/15  Yes [name], MD  metoprolol succinate (TOPROL-XL) 25 MG 24 hr tablet Take 1 tablet (25 mg total) by mouth daily. 11/06/15  Yes [name], MD  pantoprazole (PROTONIX) 40 MG tablet Take 40 mg by mouth daily. 05/28/16  Yes Historical Provider, MD  polyethylene glycol powder (GLYCOLAX/MIRALAX) powder Take by mouth. 05/28/16  Yes Historical Provider, MD  Potassium Chloride ER 20 MEQ TBCR Take 10 mEq by mouth 2 (two) times daily. 06/13/16 07/22/16 Yes Megan Annia Friendly, DO    Review of Systems    As above, he does have some muscle skeletal chest discomfort in setting of recent fall and rib bruising versus fracture. He has been seen by primary care for this. He denies palpitations, dyspnea, angina, dizziness, syncope, edema, or early satiety. He has had an increase in reflux symptoms ever since being placed on amiodarone and is now on Protonix. All other systems reviewed and are otherwise negative except as noted above.  Physical Exam    VS:  BP 120/62 (BP Location: Right Arm, Patient Position: Sitting, Cuff Size: Normal)   Pulse (!) 55   Ht '5\' 9"'$  (1.753 m)   Wt 177 lb 8 oz (80.5 kg)   BMI 26.21 kg/m  , BMI Body mass index is 26.21 kg/m. GEN: Well nourished, well developed, in no acute distress.  HEENT: normal.  Neck: Supple, no JVD, carotid bruits, or masses. Cardiac: RRR, no murmurs, rubs, or gallops. No clubbing, cyanosis, edema.  Radials/DP/PT 2+ and equal bilaterally.  Respiratory:  Respirations regular and unlabored, clear to auscultation bilaterally. GI: Soft, nontender, nondistended, BS + x 4. MS: no deformity or atrophy. Skin: warm and dry, no rash. Neuro:  Strength and sensation are intact. Psych: Normal affect.  Accessory Clinical Findings    ECG - Sinus bradycardia, 55, no acute ST or T changes.  Assessment & Plan    1.  Paroxysmal atrial fibrillation: Patient was recently hospitalized in May of this year with hip fracture requiring left hemiarthroplasty.  Postoperatively, he developed atrial fibrillation along with COPD flare. He has been maintained on amiodarone therapy and is currently taking 200 mg a day. He is in sinus rhythm today. He is also anticoagulate with eliquis 5 mg twice a day in the setting of a CHA2DS2VASc of 3. His daughter notes that he had significant reflux and even nausea with small-volume vomitus while at rehabilitation and has since been on a PPI. Symptoms have improved on PPI therapy but have not returned to baseline. We did discuss possible side effects related to amiodarone and/or eliquis. I have advised that he can try and come off of amiodarone for the next week to see if symptoms improve and if so, he can remain off of amiodarone and otherwise continue beta blocker. We also discussed the role of anticoagulation. At a CHA2DS2VASc 3, I would continue eliquis for now, noting however that he has now fallen twice and that if he continues to have falls, we should have a very low threshold for discontinuation of eliquis.  Of note, he did have stable cbc and bmet upon f/u in July.  2. Essential hypertension: Stable on beta blocker therapy.  3. Nausea and GERD-like symptoms: Improved some on PPI therapy. Going to hold amiodarone to see if symptoms resolve.  4. Disposition: Follow-up with Dr. Rockey Situ in 2 months or sooner if necessary.   Murray Hodgkins, NP 07/22/2016, 4:30 PM

## 2016-08-04 ENCOUNTER — Other Ambulatory Visit: Payer: Self-pay | Admitting: Family Medicine

## 2016-08-07 ENCOUNTER — Telehealth: Payer: Self-pay | Admitting: Cardiovascular Disease

## 2016-08-07 NOTE — Telephone Encounter (Signed)
Pt daughter calling stating pt was having some stomach issues They were advised to play around with Amiodarone Pt went off that for a week and it does seems to be doing better Just wanted to know what is another option for this Please advise.

## 2016-08-07 NOTE — Telephone Encounter (Signed)
S/w pt daughter, Manuela Schwartz, who reports pt continued to have stomach issues. He stopped taking amiodarone 2 weeks ago and sx have resolved. Per OV notes: " I have advised that he can try and come off of amiodarone for the next week to see if symptoms improve and if so, he can remain off of amiodarone and otherwise continue beta blocker." Informed Arlice Colt will make Gerald Stabs aware. He will remain off amiodarone but continue all other medications as prescribed.

## 2016-08-16 ENCOUNTER — Other Ambulatory Visit: Payer: Self-pay | Admitting: Family Medicine

## 2016-09-09 ENCOUNTER — Other Ambulatory Visit: Payer: Self-pay | Admitting: Family Medicine

## 2016-09-09 NOTE — Telephone Encounter (Signed)
Your patient 

## 2016-09-22 ENCOUNTER — Ambulatory Visit (INDEPENDENT_AMBULATORY_CARE_PROVIDER_SITE_OTHER): Payer: Medicare Other | Admitting: Cardiovascular Disease

## 2016-09-22 ENCOUNTER — Encounter: Payer: Self-pay | Admitting: Cardiovascular Disease

## 2016-09-22 VITALS — BP 112/62 | HR 46 | Ht 69.0 in | Wt 177.5 lb

## 2016-09-22 DIAGNOSIS — E7800 Pure hypercholesterolemia, unspecified: Secondary | ICD-10-CM | POA: Diagnosis not present

## 2016-09-22 DIAGNOSIS — N183 Chronic kidney disease, stage 3 unspecified: Secondary | ICD-10-CM

## 2016-09-22 DIAGNOSIS — J42 Unspecified chronic bronchitis: Secondary | ICD-10-CM | POA: Diagnosis not present

## 2016-09-22 DIAGNOSIS — Z23 Encounter for immunization: Secondary | ICD-10-CM | POA: Diagnosis not present

## 2016-09-22 DIAGNOSIS — I2510 Atherosclerotic heart disease of native coronary artery without angina pectoris: Secondary | ICD-10-CM | POA: Diagnosis not present

## 2016-09-22 DIAGNOSIS — G250 Essential tremor: Secondary | ICD-10-CM

## 2016-09-22 DIAGNOSIS — I480 Paroxysmal atrial fibrillation: Secondary | ICD-10-CM

## 2016-09-22 NOTE — Progress Notes (Signed)
Cardiology Office Note  Date:  09/22/2016   ID:  Jeffrey Haynes, DOB [birth year], MRN [id number]  PCP:  Golden Pop, MD   Chief Complaint  Patient presents with  . other    2 mo f/u.    HPI:  82 year old male with prior history of COPD, hyperlipidemia, tremor, peptic ulcer disease with history of bleeding ulcers in 2013, syncope in the setting of orthostatic hypotension, hypertension, hypothyroidism, History of falls, and reported history of coronary artery disease which has been medically managed. He presents for follow-up of his paroxysmal atrial fibrillation   In May 2017, admitted following a mechanical fall which resulted in a left femoral neck fracture. Status post left hemiarthroplasty and postoperatively developed COPD exacerbation  And A. fib with RVR rate 145 bpm.  placed on amiodarone therapy   placed on eliquis therapy.   Additional mechanical fall since hospital discharge  He struck his chin and his chest. He has had some rib discomfort with deep breaths   On his last clinic visit he was sinus bradycardia 55 bpm now sinus brady rate 46 bpm  In follow-up today he reports that he feels well with no complaints, no orthostatic symptoms He is taking metoprolol for history of tremor though previously was taking one half pill daily. He is currently not taking amiodarone . Last fall was in July 2017 Family with him today wonder if he can come off his anticoagulation  EKG on today's visit shows sinus bradycardia with rate 46 bpm, no significant ST or T-wave changes   PMH:   has a past medical history of Bleeding ulcer (09/2012); CAD (coronary artery disease); COPD (chronic obstructive pulmonary disease) ([place]); Essential hypertension; Familial tremor; Hyperlipidemia; Hypothyroidism; Orthostatic hypotension; and PAF (paroxysmal atrial fibrillation) ([place]).  PSH:    Past Surgical History:  Procedure Laterality Date  . HERNIA REPAIR    . HIP ARTHROPLASTY Left 05/02/2016    Procedure: ARTHROPLASTY BIPOLAR HIP (HEMIARTHROPLASTY);  Surgeon: [name], MD;  Location: [facility] ORS;  Service: Orthopedics;  Laterality: Left;  . HIP SURGERY Left 05/02/2016  . SPINE SURGERY  1975   LS disk    Current Outpatient Prescriptions  Medication Sig Dispense Refill  . acetaminophen (TYLENOL) 500 MG tablet Take 500 mg by mouth every 6 (six) hours as needed.    [name] albuterol (PROVENTIL HFA;VENTOLIN HFA) 108 (90 BASE) MCG/ACT inhaler Inhale 2 puffs into the lungs every 6 (six) hours as needed for wheezing or shortness of breath. 1 Inhaler 2  . atorvastatin (LIPITOR) 20 MG tablet Take 20 mg by mouth at bedtime.    . fexofenadine (ALLEGRA) 180 MG tablet Take 180 mg by mouth daily.    . fluticasone (FLONASE) 50 MCG/ACT nasal spray Place 1 spray into both nostrils daily.    . Ipratropium-Albuterol (COMBIVENT) 20-100 MCG/ACT AERS respimat Inhale 2 puffs into the lungs 2 (two) times daily. 3 Inhaler 4  . levothyroxine (SYNTHROID, LEVOTHROID) 75 MCG tablet Take 1 tablet (75 mcg total) by mouth daily before breakfast. 90 tablet 4  . metoprolol succinate (TOPROL-XL) 25 MG 24 hr tablet Take 1 tablet (25 mg total) by mouth daily. 90 tablet 4  . pantoprazole (PROTONIX) 40 MG tablet Take 40 mg by mouth daily.    . polyethylene glycol powder (GLYCOLAX/MIRALAX) powder Take by mouth.    . potassium chloride (K-DUR) 10 MEQ tablet Take ONE TABLET by mouth 2 (two) times daily. 60 tablet 0   No current facility-administered medications for this visit.  Allergies:   Review of patient's allergies indicates no known allergies.   Social History:  The patient  reports that he quit smoking about 38 years ago. His smoking use included Cigarettes. He quit smokeless tobacco use about 3 years ago. His smokeless tobacco use included Chew. He reports that he does not drink alcohol or use drugs.   Family History:   Family history is unknown by patient.    Review of Systems: Review of Systems   Constitutional: Negative.   Respiratory: Negative.   Cardiovascular: Negative.   Gastrointestinal: Negative.   Musculoskeletal: Negative.        Gait instability  Neurological: Negative.   Psychiatric/Behavioral: Negative.   All other systems reviewed and are negative.    PHYSICAL EXAM: VS:  BP 112/62 (BP Location: Left Arm, Patient Position: Sitting, Cuff Size: Normal)   Pulse (!) 46   Ht '5\' 9"'$  (1.753 m)   Wt 177 lb 8 oz (80.5 kg)   BMI 26.21 kg/m  , BMI Body mass index is 26.21 kg/m. GEN: Well nourished, well developed, in no acute distress , gait instability  in the exam room HEENT: normal  Neck: no JVD, carotid bruits, or masses Cardiac: RRR; no murmurs, rubs, or gallops,no edema  Respiratory:  clear to auscultation bilaterally, normal work of breathing GI: soft, nontender, nondistended, + BS MS: no deformity or atrophy  Skin: warm and dry, no rash Neuro:  Strength and sensation are intact Psych: euthymic mood, full affect    Recent Labs: 05/06/2016: TSH 1.610 05/10/2016: Hemoglobin 10.9 06/13/2016: ALT 11; Platelets 336 06/18/2016: BUN 15; Creatinine, Ser 1.21; Potassium 4.4; Sodium 139    Lipid Panel Lab Results  Component Value Date   CHOL 117 11/06/2015   HDL 39 (L) 11/06/2015   LDLCALC 57 11/06/2015   TRIG 105 11/06/2015      Wt Readings from Last 3 Encounters:  09/22/16 177 lb 8 oz (80.5 kg)  07/22/16 177 lb 8 oz (80.5 kg)  07/14/16 177 lb (80.3 kg)       ASSESSMENT AND PLAN:  Coronary artery disease involving native coronary artery of native heart without angina pectoris Currently with no symptoms of angina. No further workup at this time. Continue current medication regimen.  Encounter for immunization - Plan: Flu Vaccine QUAD 36+ mos IM  Pure hypercholesterolemia Cholesterol is at goal on the current lipid regimen. No changes to the medications were made.  Paroxysmal atrial fibrillation ([place]) Long discussion with him concerning  anticoagulation Family would like him to come off coagulation given high fall risk Risk and benefit reviewed with him. He does also have history of GI bleed. Chads Vasc is very low and given his high risk of falls, previous GI bleeding history, atrial fibrillation in the setting of COPD exacerbation and surgery, we will hold the anticoagulation/eliquis.  Chronic bronchitis, unspecified chronic bronchitis type (HCC)  CKD (chronic kidney disease) s from tage 3, GFR 30-59 ml/min  Familial tremor Currently takes metoprolol 25 mg daily Recommended he decrease this down to 12.5 mg daily given his bradycardia  Disposition:   F/U  6 months   Orders Placed This Encounter  Procedures  . Flu Vaccine QUAD 36+ mos IM    Signed, Esmond Plants, M.D., Ph.D. 09/22/2016  [place], [place]

## 2016-09-22 NOTE — Patient Instructions (Signed)
Medication Instructions:   Ok to stop eliquis Cut the metoprolol in 1/2 daily  Monitor heart rate at home If it runs high, call the office  Labwork:  No new labs needed  Testing/Procedures:  No further testing at this time   Follow-Up: It was a pleasure seeing you in the office today. Please call us if you have new issues that need to be addressed before your next appt.  [phone]  Your physician wants you to follow-up in: 6 months.  You will receive a reminder letter in the mail two months in advance. If you don't receive a letter, please call our office to schedule the follow-up appointment.  If you need a refill on your cardiac medications before your next appointment, please call your pharmacy.

## 2016-10-06 ENCOUNTER — Telehealth: Payer: Self-pay

## 2016-10-06 NOTE — Telephone Encounter (Signed)
Pharmacy is requesting a refill on patient's  POTASSIUM CL ER 10 MEQ  one tablet BID

## 2016-10-07 MED ORDER — POTASSIUM CHLORIDE ER 10 MEQ PO TBCR: EXTENDED_RELEASE_TABLET | ORAL | 0 refills | Status: DC

## 2016-10-29 ENCOUNTER — Encounter: Payer: Self-pay | Admitting: Family Medicine

## 2016-10-29 ENCOUNTER — Ambulatory Visit (INDEPENDENT_AMBULATORY_CARE_PROVIDER_SITE_OTHER): Payer: Medicare Other | Admitting: Family Medicine

## 2016-10-29 VITALS — BP 131/57 | HR 54 | Temp 98.1°F | Ht 67.5 in | Wt 180.0 lb

## 2016-10-29 DIAGNOSIS — J42 Unspecified chronic bronchitis: Secondary | ICD-10-CM

## 2016-10-29 DIAGNOSIS — Z23 Encounter for immunization: Secondary | ICD-10-CM | POA: Diagnosis not present

## 2016-10-29 DIAGNOSIS — H9193 Unspecified hearing loss, bilateral: Secondary | ICD-10-CM | POA: Insufficient documentation

## 2016-10-29 DIAGNOSIS — E7800 Pure hypercholesterolemia, unspecified: Secondary | ICD-10-CM

## 2016-10-29 DIAGNOSIS — J019 Acute sinusitis, unspecified: Secondary | ICD-10-CM

## 2016-10-29 DIAGNOSIS — E039 Hypothyroidism, unspecified: Secondary | ICD-10-CM | POA: Diagnosis not present

## 2016-10-29 DIAGNOSIS — N183 Chronic kidney disease, stage 3 unspecified: Secondary | ICD-10-CM

## 2016-10-29 DIAGNOSIS — I2510 Atherosclerotic heart disease of native coronary artery without angina pectoris: Secondary | ICD-10-CM | POA: Diagnosis not present

## 2016-10-29 MED ORDER — AMOXICILLIN 875 MG PO TABS: 875.0000 mg | ORAL_TABLET | Freq: Two times a day (BID) | ORAL | 0 refills | Status: DC

## 2016-10-29 MED ORDER — ATORVASTATIN CALCIUM 20 MG PO TABS: 20.0000 mg | ORAL_TABLET | Freq: Every day | ORAL | 4 refills | Status: DC

## 2016-10-29 MED ORDER — METOPROLOL SUCCINATE ER 25 MG PO TB24: 25.0000 mg | ORAL_TABLET | Freq: Every day | ORAL | 4 refills | Status: AC

## 2016-10-29 MED ORDER — LEVOTHYROXINE SODIUM 75 MCG PO TABS: 75.0000 ug | ORAL_TABLET | Freq: Every day | ORAL | 4 refills | Status: AC

## 2016-10-29 MED ORDER — ALBUTEROL SULFATE HFA 108 (90 BASE) MCG/ACT IN AERS: 2.0000 | INHALATION_SPRAY | Freq: Four times a day (QID) | RESPIRATORY_TRACT | 2 refills | Status: AC | PRN

## 2016-10-29 NOTE — Assessment & Plan Note (Signed)
The current medical regimen is effective;  continue present plan and medications.  

## 2016-10-29 NOTE — Progress Notes (Signed)
BP (!) 131/57   Pulse (!) 54   Temp 98.1 F (36.7 C)   Ht 5' 7.5" (1.715 m)   Wt 180 lb (81.6 kg)   SpO2 96%   BMI 27.78 kg/m    Subjective:    Patient ID: Jeffrey Haynes, male    DOB: 03-05-1925, 82 y.o.   MRN: [id number]  HPI: Jeffrey Haynes is a 82 y.o. male  Chief Complaint  Patient presents with  . Annual Exam   Patient's primary concern is some chest congestion and difficulty getting full breath also some head cold symptoms with right ear stopped up. This is been ongoing for a week or so. No fever or chills. Has occasional coughing spells. Has had some chills and night sweats Other conditions have been stable with reflux doing well blood pressure doing well. No complaints from medications. Cardiologist stopped Eliquis. Hasn't had any issues with palpitations or irregular heartbeats. Also uses Combivent without problems. Takes Lipitor without problems. Relevant past medical, surgical, family and social history reviewed and updated as indicated. Interim medical history since our last visit reviewed. Allergies and medications reviewed and updated.  Review of Systems  Constitutional: Negative.   HENT: Negative.   Eyes: Negative.   Respiratory: Negative.   Cardiovascular: Negative.   Gastrointestinal: Negative.   Endocrine: Negative.   Genitourinary: Negative.   Musculoskeletal: Negative.   Skin: Negative.   Allergic/Immunologic: Negative.   Neurological: Negative.   Hematological: Negative.   Psychiatric/Behavioral: Negative.    Reviewed cardiology notes and Eliquis stopped because of high risk for falling Per HPI unless specifically indicated above     Objective:    BP (!) 131/57   Pulse (!) 54   Temp 98.1 F (36.7 C)   Ht 5' 7.5" (1.715 m)   Wt 180 lb (81.6 kg)   SpO2 96%   BMI 27.78 kg/m   Wt Readings from Last 3 Encounters:  10/29/16 180 lb (81.6 kg)  09/22/16 177 lb 8 oz (80.5 kg)  07/22/16 177 lb 8 oz (80.5 kg)    Physical Exam  Constitutional:  He is oriented to person, place, and time. He appears well-developed and well-nourished.  HENT:  Head: Normocephalic.  Right Ear: External ear normal.  Left Ear: External ear normal.  Nose: Nose normal.  Eyes: Conjunctivae and EOM are normal. Pupils are equal, round, and reactive to light.  Neck: Normal range of motion. Neck supple. No thyromegaly present.  Cardiovascular: Normal rate, regular rhythm, normal heart sounds and intact distal pulses.   Pulmonary/Chest: Effort normal and breath sounds normal.  Abdominal: Soft. Bowel sounds are normal. There is no splenomegaly or hepatomegaly.  Genitourinary: Penis normal.  Genitourinary Comments: No prostate exam 89  Musculoskeletal: Normal range of motion.  Lymphadenopathy:    He has no cervical adenopathy.  Neurological: He is alert and oriented to person, place, and time. He has normal reflexes.  Skin: Skin is warm and dry.  Psychiatric: He has a normal mood and affect. His behavior is normal. Judgment and thought content normal.    Results for orders placed or performed in visit on 32/67/12  Basic metabolic panel  Result Value Ref Range   Glucose 113 (H) 65 - 99 mg/dL   BUN 15 8 - 27 mg/dL   Creatinine, Ser 1.21 0.76 - 1.27 mg/dL   GFR calc non Af Amer 53 (L) >59 mL/min/1.73   GFR calc Af Amer 61 >59 mL/min/1.73   BUN/Creatinine Ratio 12 10 - 24  Sodium 139 134 - 144 mmol/L   Potassium 4.4 3.5 - 5.2 mmol/L   Chloride 100 96 - 106 mmol/L   CO2 24 18 - 29 mmol/L   Calcium 9.2 8.6 - 10.2 mg/dL      Assessment & Plan:   Problem List Items Addressed This Visit      Cardiovascular and Mediastinum   CAD (coronary artery disease) - Primary    The current medical regimen is effective;  continue present plan and medications.       Relevant Medications   atorvastatin (LIPITOR) 20 MG tablet   metoprolol succinate (TOPROL-XL) 25 MG 24 hr tablet   Other Relevant Orders   CBC with Differential/Platelet   Comprehensive metabolic  panel   Lipid Profile   PSA     Respiratory   COPD (chronic obstructive pulmonary disease) ([place])    The current medical regimen is effective;  continue present plan and medications.       Relevant Medications   albuterol (PROVENTIL HFA;VENTOLIN HFA) 108 (90 Base) MCG/ACT inhaler     Endocrine   Hypothyroidism   Relevant Medications   levothyroxine (SYNTHROID, LEVOTHROID) 75 MCG tablet   metoprolol succinate (TOPROL-XL) 25 MG 24 hr tablet   Other Relevant Orders   TSH     Genitourinary   CKD (chronic kidney disease) stage 3, GFR 30-59 ml/min   Relevant Orders   Comprehensive metabolic panel   Urinalysis, Routine w reflex microscopic (not at [facility])     Other   Hyperlipidemia    The current medical regimen is effective;  continue present plan and medications.       Relevant Medications   atorvastatin (LIPITOR) 20 MG tablet   metoprolol succinate (TOPROL-XL) 25 MG 24 hr tablet   Other Relevant Orders   Lipid Profile    Other Visit Diagnoses    Need for pneumococcal vaccination       Relevant Orders   Pneumococcal conjugate vaccine 13-valent (Completed)   Acute sinusitis, recurrence not specified, unspecified location       Sinusitis care and treatment will use Amoxil patient education on use of over-the-counter medications   Relevant Medications   amoxicillin (AMOXIL) 875 MG tablet       Follow up plan: Return in about 6 months (around 04/28/2017) for BMP,  Lipids, ALT, AST, Hemoglobin A1c.

## 2016-10-29 NOTE — Assessment & Plan Note (Signed)
Decrease hearing is getting worse especially with current sinus infection but is been ongoing is probably needed hearing aids for some time and is now ready to have that checked.

## 2016-10-30 LAB — URINALYSIS, ROUTINE W REFLEX MICROSCOPIC
BILIRUBIN UA: NEGATIVE
GLUCOSE, UA: NEGATIVE
KETONES UA: NEGATIVE
LEUKOCYTES UA: NEGATIVE
Nitrite, UA: NEGATIVE
PROTEIN UA: NEGATIVE
SPEC GRAV UA: 1.02 (ref 1.005–1.030)
Urobilinogen, Ur: 0.2 mg/dL (ref 0.2–1.0)
pH, UA: 6 (ref 5.0–7.5)

## 2016-10-30 LAB — COMPREHENSIVE METABOLIC PANEL
A/G RATIO: 1.3 (ref 1.2–2.2)
ALBUMIN: 3.6 g/dL (ref 3.5–4.7)
ALT: 46 IU/L — ABNORMAL HIGH (ref 0–44)
AST: 28 IU/L (ref 0–40)
Alkaline Phosphatase: 178 IU/L — ABNORMAL HIGH (ref 39–117)
BUN / CREAT RATIO: 15 (ref 10–24)
BUN: 18 mg/dL (ref 8–27)
Bilirubin Total: 0.5 mg/dL (ref 0.0–1.2)
CALCIUM: 8.7 mg/dL (ref 8.6–10.2)
CO2: 23 mmol/L (ref 18–29)
Chloride: 103 mmol/L (ref 96–106)
Creatinine, Ser: 1.24 mg/dL (ref 0.76–1.27)
GFR, EST AFRICAN AMERICAN: 59 mL/min/{1.73_m2} — AB (ref >59)
GFR, EST NON AFRICAN AMERICAN: 51 mL/min/{1.73_m2} — AB (ref >59)
GLOBULIN, TOTAL: 2.7 g/dL (ref 1.5–4.5)
Glucose: 97 mg/dL (ref 65–99)
POTASSIUM: 4.9 mmol/L (ref 3.5–5.2)
SODIUM: 141 mmol/L (ref 134–144)
TOTAL PROTEIN: 6.3 g/dL (ref 6.0–8.5)

## 2016-10-30 LAB — CBC WITH DIFFERENTIAL/PLATELET
BASOS: 1 %
Basophils Absolute: 0 10*3/uL (ref 0.0–0.2)
EOS (ABSOLUTE): 0.3 10*3/uL (ref 0.0–0.4)
EOS: 3 %
HEMATOCRIT: 36.7 % — AB (ref 37.5–51.0)
Hemoglobin: 11.7 g/dL — ABNORMAL LOW (ref 12.6–17.7)
IMMATURE GRANULOCYTES: 0 %
Immature Grans (Abs): 0 10*3/uL (ref 0.0–0.1)
Lymphocytes Absolute: 2.4 10*3/uL (ref 0.7–3.1)
Lymphs: 29 %
MCH: 30.5 pg (ref 26.6–33.0)
MCHC: 31.9 g/dL (ref 31.5–35.7)
MCV: 96 fL (ref 79–97)
MONOS ABS: 0.6 10*3/uL (ref 0.1–0.9)
Monocytes: 7 %
NEUTROS PCT: 60 %
Neutrophils Absolute: 4.8 10*3/uL (ref 1.4–7.0)
Platelets: 236 10*3/uL (ref 150–379)
RBC: 3.84 x10E6/uL — ABNORMAL LOW (ref 4.14–5.80)
RDW: 15.4 % (ref 12.3–15.4)
WBC: 8.1 10*3/uL (ref 3.4–10.8)

## 2016-10-30 LAB — LIPID PANEL
CHOL/HDL RATIO: 3 ratio (ref 0.0–5.0)
Cholesterol, Total: 131 mg/dL (ref 100–199)
HDL: 44 mg/dL (ref >39)
LDL Calculated: 60 mg/dL (ref 0–99)
Triglycerides: 134 mg/dL (ref 0–149)
VLDL Cholesterol Cal: 27 mg/dL (ref 5–40)

## 2016-10-30 LAB — TSH: TSH: 5.07 u[IU]/mL — ABNORMAL HIGH (ref 0.450–4.500)

## 2016-10-30 LAB — MICROSCOPIC EXAMINATION
Epithelial Cells (non renal): NONE SEEN /hpf (ref 0–10)
WBC, UA: NONE SEEN /hpf (ref 0–?)

## 2016-10-30 LAB — PSA: Prostate Specific Ag, Serum: 4.1 ng/mL — ABNORMAL HIGH (ref 0.0–4.0)

## 2016-11-03 ENCOUNTER — Telehealth: Payer: Self-pay | Admitting: Family Medicine

## 2016-11-03 DIAGNOSIS — E039 Hypothyroidism, unspecified: Secondary | ICD-10-CM

## 2016-11-03 NOTE — Telephone Encounter (Signed)
Phone call Discussed with patient slightly elevated TSH patient's been taking his thyroid faithfully without fail and no change in medications taken appropriately on empty stomach with a glass of water nothing to eat for 30 minutes. Will not change medications just check TSH again in 2 months or so.

## 2016-11-04 ENCOUNTER — Other Ambulatory Visit: Payer: Self-pay | Admitting: Family Medicine

## 2016-11-04 NOTE — Telephone Encounter (Signed)
Your patient 

## 2017-01-05 ENCOUNTER — Other Ambulatory Visit: Payer: Self-pay | Admitting: Family Medicine

## 2017-01-06 ENCOUNTER — Other Ambulatory Visit: Payer: Self-pay

## 2017-01-06 MED ORDER — PANTOPRAZOLE SODIUM 40 MG PO TBEC: 40.0000 mg | DELAYED_RELEASE_TABLET | Freq: Every day | ORAL | 12 refills | Status: AC

## 2017-01-06 NOTE — Telephone Encounter (Signed)
Last (acute) OV: 10/29/16 Last routine OV: 10/29/16 Next OV: 04/28/17

## 2017-02-09 ENCOUNTER — Other Ambulatory Visit: Payer: Self-pay | Admitting: Family Medicine

## 2017-03-09 ENCOUNTER — Other Ambulatory Visit: Payer: Self-pay | Admitting: Family Medicine

## 2017-03-22 NOTE — Progress Notes (Signed)
Cardiology Office Note  Date:  03/23/2017   ID:  [name], DOB 1925-06-01, MRN [id number]  PCP:  Golden Pop, MD   Chief Complaint  Patient presents with  . other    20mof/u. Pt c/p sob; denies cp. Pt states he has bronchitis.  Reviewed meds with pt verbally.    HPI:  83year old male with prior history of   paroxysmal atrial fibrillation, Not on anticoagulation secondary to high fall risk, GI bleed history COPD, Moderate to severe hyperlipidemia,  tremor,  peptic ulcer disease with history of bleeding ulcers in 2013,  syncope in the setting of orthostatic hypotension, hypertension,  hypothyroidism, History of falls, Anticoagulation held reported history of coronary artery disease which has been medically managed. He presents for follow-up of his paroxysmal atrial fibrillation  On his last clinic visit,  Family wanted him to come off coagulation given high fall risk Risk and benefit reviewed with him.  history of GI bleed.  Chads Vasc is very low and given his high risk of falls, previous GI bleeding history, atrial fibrillation in the setting of COPD exacerbation and surgery, we held the anticoagulation/eliquis.   In May 2017, admitted following a mechanical fall which resulted in a left femoral neck fracture. Status post left hemiarthroplasty and postoperatively developed COPD exacerbation  And A. fib with RVR rate 145 bpm.  placed on amiodarone therapy   placed on eliquis therapy.   Additional mechanical fall since hospital discharge  He struck his chin and his chest. He has had some rib discomfort with deep breaths   In follow-up today she has been active, denies any recent falls, Working in the garden Significant shortness of breath with overactivity, hills and stairs Takes half dose of metoprolol  EKG personally reviewed by myself on todays visit Shows normal sinus rhythm with rate 56 bpm no significant ST or T-wave changes     PMH:   has a past medical  history of Bleeding ulcer (09/2012); CAD (coronary artery disease); COPD (chronic obstructive pulmonary disease) ([place]; Essential hypertension; Familial tremor; Hyperlipidemia; Hypothyroidism; Orthostatic hypotension; and PAF ([place]oxysmal [place] fibrillation) ([place].  PSH:    Past Surgical History:  Procedure Laterality Date  . HERNIA REPAIR    . HIP ARTHROPLASTY Left 05/02/2016   Procedure: ARTHROPLASTY BIPOLAR HIP (HEMIARTHROPLASTY);  Surgeon: JCorky Mull MD;  Location: [facility] ORS;  Service: Orthopedics;  Laterality: Left;  . HIP SURGERY Left 05/02/2016  . SPINE SURGERY  1975   LS disk    Current Outpatient Prescriptions  Medication Sig Dispense Refill  . acetaminophen (TYLENOL) 500 MG tablet Take 500 mg by mouth every 6 (six) hours as needed.    .Marland Kitchenalbuterol (PROVENTIL HFA;VENTOLIN HFA) 108 (90 Base) MCG/ACT inhaler Inhale 2 puffs into the lungs every 6 (six) hours as needed for wheezing or shortness of breath. 1 Inhaler 2  . atorvastatin (LIPITOR) 20 MG tablet Take 1 tablet (20 mg total) by mouth at bedtime. 90 tablet 4  . Ipratropium-Albuterol (COMBIVENT) 20-100 MCG/ACT AERS respimat Inhale 2 puffs into the lungs 2 (two) times daily. 3 Inhaler 4  . levothyroxine (SYNTHROID, LEVOTHROID) 75 MCG tablet Take 1 tablet (75 mcg total) by mouth daily before breakfast. 90 tablet 4  . metoprolol succinate (TOPROL-XL) 25 MG 24 hr tablet Take 1 tablet (25 mg total) by mouth daily. 90 tablet 4  . pantoprazole (PROTONIX) 40 MG tablet Take 1 tablet (40 mg total) by mouth daily. 30 tablet 12  . potassium chloride (K-DUR) 10  MEQ tablet Take ONE TABLET by mouth 2 (two) times daily. 60 tablet 3   No current facility-administered medications for this visit.      Allergies:   Patient has no known allergies.   Social History:  The patient  reports that he quit smoking about 38 years ago. His smoking use included Cigarettes. He quit smokeless tobacco use about 4 years ago. His smokeless tobacco use included  Chew. He reports that he does not drink alcohol or use drugs.   Family History:   Family history is unknown by patient.    Review of Systems: Review of Systems  Constitutional: Negative.   Respiratory: Positive for shortness of breath.   Cardiovascular: Negative.   Gastrointestinal: Negative.   Musculoskeletal: Negative.        Gait instability  Neurological: Negative.   Psychiatric/Behavioral: Negative.   All other systems reviewed and are negative.    PHYSICAL EXAM: VS:  BP 118/60 (BP Location: Left Arm, Patient Position: Sitting, Cuff Size: Normal)   Pulse (!) 56   Ht '5\' 9"'$  (1.753 m)   Wt 188 lb 12 oz (85.6 kg)   BMI 27.87 kg/m  , BMI Body mass index is 27.87 kg/m. GEN: Well nourished, well developed, in no acute distress , gait instability  in the exam room HEENT: normal  Neck: no JVD, carotid bruits, or masses Cardiac: RRR; no murmurs, rubs, or gallops,no edema  Respiratory:  Moderately decreased breath sounds throughout, normal work of breathing GI: soft, nontender, nondistended, + BS MS: no deformity or atrophy  Skin: warm and dry, no rash Neuro:  Strength and sensation are intact Psych: euthymic mood, full affect    Recent Labs: 05/10/2016: Hemoglobin 10.9 10/29/2016: ALT 46; BUN 18; Creatinine, Ser 1.24; Platelets 236; Potassium 4.9; Sodium 141; TSH 5.070    Lipid Panel Lab Results  Component Value Date   CHOL 131 10/29/2016   HDL 44 10/29/2016   LDLCALC 60 10/29/2016   TRIG 134 10/29/2016      Wt Readings from Last 3 Encounters:  03/23/17 188 lb 12 oz (85.6 kg)  10/29/16 180 lb (81.6 kg)  09/22/16 177 lb 8 oz (80.5 kg)       ASSESSMENT AND PLAN:  Coronary artery disease involving native coronary artery of native heart without angina pectoris Currently with no symptoms of angina. No further workup at this time. Continue current medication regimen.  Pure hypercholesterolemia Cholesterol is at goal on the current lipid regimen. No changes to  the medications were made.  Paroxysmal atrial fibrillation (HCC) As on his last clinic visit, Long discussion with him concerning anticoagulation He presents today with family, son.  Patient does not like taking medications, does not want anticoagulation Risk and benefit reviewed with him. history of GI bleed. Chads Vasc is very low and given his high risk of falls, previous GI bleeding history, atrial fibrillation in the setting of COPD exacerbation and surgery, we will Continue to hold the anticoagulation/eliquis.  Chronic bronchitis, unspecified chronic bronchitis type (HCC)  CKD (chronic kidney disease) s from tage 3, GFR 30-59 ml/min Stable renal function Recommended he decrease potassium down to 1 a day He has follow-up lab work with primary care one month Potassium stable at that time, potentially could stop the potassium  Familial tremor Currently takes metoprolol 12.5 mg daily Decreased dose secondary to bradycardia  Disposition:   F/U  6 months   Orders Placed This Encounter  Procedures  . EKG 12-Lead    Signed, Octavia Bruckner  Rockey Situ, M.D., Ph.D. 03/23/2017  [place], [place]

## 2017-03-23 ENCOUNTER — Ambulatory Visit (INDEPENDENT_AMBULATORY_CARE_PROVIDER_SITE_OTHER): Payer: Medicare Other | Admitting: Cardiovascular Disease

## 2017-03-23 ENCOUNTER — Encounter: Payer: Self-pay | Admitting: Cardiovascular Disease

## 2017-03-23 VITALS — BP 118/60 | HR 56 | Ht 69.0 in | Wt 188.8 lb

## 2017-03-23 DIAGNOSIS — W19XXXD Unspecified fall, subsequent encounter: Secondary | ICD-10-CM

## 2017-03-23 DIAGNOSIS — E782 Mixed hyperlipidemia: Secondary | ICD-10-CM

## 2017-03-23 DIAGNOSIS — I25118 Atherosclerotic heart disease of native coronary artery with other forms of angina pectoris: Secondary | ICD-10-CM

## 2017-03-23 DIAGNOSIS — I4891 Unspecified atrial fibrillation: Secondary | ICD-10-CM | POA: Diagnosis not present

## 2017-03-23 DIAGNOSIS — N183 Chronic kidney disease, stage 3 unspecified: Secondary | ICD-10-CM

## 2017-03-23 MED ORDER — POTASSIUM CHLORIDE ER 10 MEQ PO TBCR: 10.0000 meq | EXTENDED_RELEASE_TABLET | Freq: Every day | ORAL | 6 refills | Status: DC

## 2017-03-23 NOTE — Patient Instructions (Addendum)
Medication Instructions:   Please decrease the potassium down to one a day   Labwork:  No new labs needed  Testing/Procedures:  No further testing at this time   I recommend watching educational videos on topics of interest to you at:       [email or web address]  Enter code: [email or web address]    Follow-Up: It was a pleasure seeing you in the office today. Please call us if you have new issues that need to be addressed before your next appt.  [phone]  Your physician wants you to follow-up in: 6 months.  You will receive a reminder letter in the mail two months in advance. If you don't receive a letter, please call our office to schedule the follow-up appointment.  If you need a refill on your cardiac medications before your next appointment, please call your pharmacy.

## 2017-04-28 ENCOUNTER — Ambulatory Visit (INDEPENDENT_AMBULATORY_CARE_PROVIDER_SITE_OTHER): Payer: Medicare Other | Admitting: Family Medicine

## 2017-04-28 ENCOUNTER — Encounter: Payer: Self-pay | Admitting: Family Medicine

## 2017-04-28 VITALS — BP 124/75 | HR 62 | Wt 189.0 lb

## 2017-04-28 DIAGNOSIS — J42 Unspecified chronic bronchitis: Secondary | ICD-10-CM

## 2017-04-28 DIAGNOSIS — E782 Mixed hyperlipidemia: Secondary | ICD-10-CM | POA: Diagnosis not present

## 2017-04-28 DIAGNOSIS — L989 Disorder of the skin and subcutaneous tissue, unspecified: Secondary | ICD-10-CM | POA: Diagnosis not present

## 2017-04-28 DIAGNOSIS — I25118 Atherosclerotic heart disease of native coronary artery with other forms of angina pectoris: Secondary | ICD-10-CM

## 2017-04-28 LAB — LP+ALT+AST PICCOLO, WAIVED
ALT (SGPT) Piccolo, Waived: 13 U/L (ref 10–47)
AST (SGOT) Piccolo, Waived: 16 U/L (ref 11–38)
Chol/HDL Ratio Piccolo,Waive: 3.2 mg/dL
Cholesterol Piccolo, Waived: 137 mg/dL (ref <200)
HDL CHOL PICCOLO, WAIVED: 43 mg/dL — AB (ref >59)
LDL CHOL CALC PICCOLO WAIVED: 71 mg/dL (ref <100)
TRIGLYCERIDES PICCOLO,WAIVED: 114 mg/dL (ref <150)
VLDL CHOL CALC PICCOLO,WAIVE: 23 mg/dL (ref <30)

## 2017-04-28 LAB — BAYER DCA HB A1C WAIVED: HB A1C (BAYER DCA - WAIVED): 5.5 % (ref <7.0)

## 2017-04-28 NOTE — Assessment & Plan Note (Signed)
The current medical regimen is effective;  continue present plan and medications.  

## 2017-04-28 NOTE — Addendum Note (Signed)
Addended by: Gerda Diss A on: 04/28/2017 02:51 PM   Modules accepted: Orders

## 2017-04-28 NOTE — Progress Notes (Signed)
BP 124/75   Pulse 62   Wt 189 lb (85.7 kg)   SpO2 98%   BMI 27.91 kg/m    Subjective:    Patient ID: Jeffrey Haynes, male    DOB: 12-22-1924, 83 y.o.   MRN: [id number]  HPI: Jeffrey Haynes is a 83 y.o. male  Chief Complaint  Patient presents with  . Follow-up  . SKin check    Right side of head, near hearing aid   Patient follow-up no chest pain chest tightness symptoms COPD does limit some exertion but was still able to wean 07/23/2019 minutes at a time in his yard yesterday and did just fine. Patient's biggest concern is just adjacent to his right ear anteriorly is a nonhealing inflamed skin patch not sure if he irritates it at night. Relevant past medical, surgical, family and social history reviewed and updated as indicated. Interim medical history since our last visit reviewed. Allergies and medications reviewed and updated.  Review of Systems  Constitutional: Negative.   Respiratory: Negative.   Cardiovascular: Negative.     Per HPI unless specifically indicated above     Objective:    BP 124/75   Pulse 62   Wt 189 lb (85.7 kg)   SpO2 98%   BMI 27.91 kg/m   Wt Readings from Last 3 Encounters:  04/28/17 189 lb (85.7 kg)  03/23/17 188 lb 12 oz (85.6 kg)  10/29/16 180 lb (81.6 kg)    Physical Exam  Constitutional: He is oriented to person, place, and time. He appears well-developed and well-nourished.  HENT:  Head: Normocephalic and atraumatic.  Eyes: Conjunctivae and EOM are normal.  Neck: Normal range of motion.  Cardiovascular: Normal rate, regular rhythm and normal heart sounds.   Pulmonary/Chest: Effort normal and breath sounds normal.  Musculoskeletal: Normal range of motion.  Neurological: He is alert and oriented to person, place, and time.  Skin: No erythema.  Right ear inflamed patch 4 x 6 mm shave biopsy performed after a done alcohol prep infiltrated with Xylocaine with epinephrine. Based destroyed was Surgitron unit. Patient tolerated the  procedure well. Lesion sent for pathology  Psychiatric: He has a normal mood and affect. His behavior is normal. Judgment and thought content normal.    Results for orders placed or performed in visit on 10/29/16  Microscopic Examination  Result Value Ref Range   WBC, UA None seen 0 - 5 /hpf   RBC, UA 0-2 0 - 2 /hpf   Epithelial Cells (non renal) None seen 0 - 10 /hpf  CBC with Differential/Platelet  Result Value Ref Range   WBC 8.1 3.4 - 10.8 x10E3/uL   RBC 3.84 (L) 4.14 - 5.80 x10E6/uL   Hemoglobin 11.7 (L) 12.6 - 17.7 g/dL   Hematocrit 36.7 (L) 37.5 - 51.0 %   MCV 96 79 - 97 fL   MCH 30.5 26.6 - 33.0 pg   MCHC 31.9 31.5 - 35.7 g/dL   RDW 15.4 12.3 - 15.4 %   Platelets 236 150 - 379 x10E3/uL   Neutrophils 60 Not Estab. %   Lymphs 29 Not Estab. %   Monocytes 7 Not Estab. %   Eos 3 Not Estab. %   Basos 1 Not Estab. %   Neutrophils Absolute 4.8 1.4 - 7.0 x10E3/uL   Lymphocytes Absolute 2.4 0.7 - 3.1 x10E3/uL   Monocytes Absolute 0.6 0.1 - 0.9 x10E3/uL   EOS (ABSOLUTE) 0.3 0.0 - 0.4 x10E3/uL   Basophils Absolute 0.0 0.0 -  0.2 x10E3/uL   Immature Granulocytes 0 Not Estab. %   Immature Grans (Abs) 0.0 0.0 - 0.1 x10E3/uL  Comprehensive metabolic panel  Result Value Ref Range   Glucose 97 65 - 99 mg/dL   BUN 18 8 - 27 mg/dL   Creatinine, Ser 1.24 0.76 - 1.27 mg/dL   GFR calc non Af Amer 51 (L) >59 mL/min/1.73   GFR calc Af Amer 59 (L) >59 mL/min/1.73   BUN/Creatinine Ratio 15 10 - 24   Sodium 141 134 - 144 mmol/L   Potassium 4.9 3.5 - 5.2 mmol/L   Chloride 103 96 - 106 mmol/L   CO2 23 18 - 29 mmol/L   Calcium 8.7 8.6 - 10.2 mg/dL   Total Protein 6.3 6.0 - 8.5 g/dL   Albumin 3.6 3.5 - 4.7 g/dL   Globulin, Total 2.7 1.5 - 4.5 g/dL   Albumin/Globulin Ratio 1.3 1.2 - 2.2   Bilirubin Total 0.5 0.0 - 1.2 mg/dL   Alkaline Phosphatase 178 (H) 39 - 117 IU/L   AST 28 0 - 40 IU/L   ALT 46 (H) 0 - 44 IU/L  Lipid Profile  Result Value Ref Range   Cholesterol, Total 131 100 - 199  mg/dL   Triglycerides 134 0 - 149 mg/dL   HDL 44 >39 mg/dL   VLDL Cholesterol Cal 27 5 - 40 mg/dL   LDL Calculated 60 0 - 99 mg/dL   Chol/HDL Ratio 3.0 0.0 - 5.0 ratio units  Urinalysis, Routine w reflex microscopic (not at [facility])  Result Value Ref Range   Specific Gravity, UA 1.020 1.005 - 1.030   pH, UA 6.0 5.0 - 7.5   Color, UA Yellow Yellow   Appearance Ur Clear Clear   Leukocytes, UA Negative Negative   Protein, UA Negative Negative/Trace   Glucose, UA Negative Negative   Ketones, UA Negative Negative   RBC, UA Trace (A) Negative   Bilirubin, UA Negative Negative   Urobilinogen, Ur 0.2 0.2 - 1.0 mg/dL   Nitrite, UA Negative Negative   Microscopic Examination See below:   PSA  Result Value Ref Range   Prostate Specific Ag, Serum 4.1 (H) 0.0 - 4.0 ng/mL  TSH  Result Value Ref Range   TSH 5.070 (H) 0.450 - 4.500 uIU/mL      Assessment & Plan:   Problem List Items Addressed This Visit      Cardiovascular and Mediastinum   CAD (coronary artery disease)    The current medical regimen is effective;  continue present plan and medications.         Respiratory   COPD (chronic obstructive pulmonary disease) (HCC)    The current medical regimen is effective;  continue present plan and medications.         Other   Hyperlipidemia - Primary   Relevant Orders   Basic metabolic panel   LP+ALT+AST [place], Waived   Bayer DCA Hb A1c Waived       Follow up plan: Return if symptoms worsen or fail to improve, for Physical Exam.

## 2017-04-29 ENCOUNTER — Encounter: Payer: Self-pay | Admitting: Family Medicine

## 2017-04-29 LAB — BASIC METABOLIC PANEL
BUN/Creatinine Ratio: 10 (ref 10–24)
BUN: 12 mg/dL (ref 8–27)
CALCIUM: 9 mg/dL (ref 8.6–10.2)
CO2: 25 mmol/L (ref 18–29)
Chloride: 103 mmol/L (ref 96–106)
Creatinine, Ser: 1.25 mg/dL (ref 0.76–1.27)
GFR calc non Af Amer: 51 mL/min/{1.73_m2} — ABNORMAL LOW (ref >59)
GFR, EST AFRICAN AMERICAN: 59 mL/min/{1.73_m2} — AB (ref >59)
Glucose: 110 mg/dL — ABNORMAL HIGH (ref 65–99)
Potassium: 4.5 mmol/L (ref 3.5–5.2)
SODIUM: 139 mmol/L (ref 134–144)

## 2017-04-30 LAB — PATHOLOGY

## 2017-05-06 ENCOUNTER — Telehealth: Payer: Self-pay | Admitting: Family Medicine

## 2017-05-06 DIAGNOSIS — C4441 Basal cell carcinoma of skin of scalp and neck: Secondary | ICD-10-CM

## 2017-05-06 NOTE — Telephone Encounter (Signed)
Patient called about his [place] biopsy that was taken during his last visit 04/28/2017 and wanted to know if the results where in yet. Patient states that he is at home and would like to be called back on his home number.  Please Advise,  Thank you.

## 2017-05-06 NOTE — Telephone Encounter (Signed)
Phone call Discussed with patient nasal cell carcinoma will need further evaluation by dermatology will make referral.

## 2017-05-06 NOTE — Telephone Encounter (Signed)
Pt is on call list for labs. Will call with afternoon phone calls, or see if provider can call pt during downtime today.

## 2017-05-06 NOTE — Telephone Encounter (Signed)
Left message on machine for pt to return call to the office.  

## 2017-05-20 ENCOUNTER — Telehealth: Payer: Self-pay

## 2017-05-20 NOTE — Telephone Encounter (Signed)
[facility] [address] [place], [place] [address]  Contact:   Telephone: [phone] Fax: [phone]  Spoke to Minette Brine who stated to just fax information and cross out incorrect information. Referral was faxed to them w/ attention to [facility].

## 2017-05-20 NOTE — Telephone Encounter (Signed)
Patient needs to be referred to [facility]. Dr. Retia Passe from [facility] nurse called and they can't refer patient because the codes are wrong. The lesion is on the right side, above hearing aid. But the codes in the biopsy report are SCALP, ABOVE EAR, LEFT and the ICD 10 is Basal Cell Carcinoma of scalp. They stated the lesion is no where near the scalp. They can't do anything until the biopsy report and codes are fixed.

## 2017-05-20 NOTE — Telephone Encounter (Signed)
Need to do the referral the old-fashioned way. Call the [facility] clinic and make an appointment. Left them know the location on biopsy report is wrong.

## 2017-05-20 NOTE — Telephone Encounter (Signed)
Please advise. Unsure how to change report? Or codes?

## 2017-07-20 ENCOUNTER — Inpatient Hospital Stay
Admission: EM | Admit: 2017-07-20 | Discharge: 2017-07-26 | DRG: 193 | Disposition: A | Discharge status: Skilled Nursing Facility | Payer: Medicare Other | Attending: Internal Medicine | Admitting: Internal Medicine

## 2017-07-20 ENCOUNTER — Telehealth: Payer: Self-pay | Admitting: Cardiovascular Disease

## 2017-07-20 ENCOUNTER — Emergency Department: Payer: Medicare Other

## 2017-07-20 ENCOUNTER — Encounter: Payer: Self-pay | Admitting: Emergency Medicine

## 2017-07-20 DIAGNOSIS — Z800 Family history of malignant neoplasm of digestive organs: Secondary | ICD-10-CM

## 2017-07-20 DIAGNOSIS — J441 Chronic obstructive pulmonary disease with (acute) exacerbation: Secondary | ICD-10-CM | POA: Diagnosis present

## 2017-07-20 DIAGNOSIS — E785 Hyperlipidemia, unspecified: Secondary | ICD-10-CM | POA: Diagnosis present

## 2017-07-20 DIAGNOSIS — Z7189 Other specified counseling: Secondary | ICD-10-CM

## 2017-07-20 DIAGNOSIS — Z8711 Personal history of peptic ulcer disease: Secondary | ICD-10-CM | POA: Diagnosis not present

## 2017-07-20 DIAGNOSIS — R440 Auditory hallucinations: Secondary | ICD-10-CM | POA: Diagnosis not present

## 2017-07-20 DIAGNOSIS — I480 Paroxysmal atrial fibrillation: Secondary | ICD-10-CM | POA: Diagnosis present

## 2017-07-20 DIAGNOSIS — E871 Hypo-osmolality and hyponatremia: Secondary | ICD-10-CM | POA: Diagnosis not present

## 2017-07-20 DIAGNOSIS — R441 Visual hallucinations: Secondary | ICD-10-CM | POA: Diagnosis not present

## 2017-07-20 DIAGNOSIS — Z515 Encounter for palliative care: Secondary | ICD-10-CM | POA: Diagnosis not present

## 2017-07-20 DIAGNOSIS — I10 Essential (primary) hypertension: Secondary | ICD-10-CM | POA: Diagnosis present

## 2017-07-20 DIAGNOSIS — R531 Weakness: Secondary | ICD-10-CM

## 2017-07-20 DIAGNOSIS — J8410 Pulmonary fibrosis, unspecified: Secondary | ICD-10-CM | POA: Diagnosis present

## 2017-07-20 DIAGNOSIS — J440 Chronic obstructive pulmonary disease with acute lower respiratory infection: Secondary | ICD-10-CM | POA: Diagnosis present

## 2017-07-20 DIAGNOSIS — E875 Hyperkalemia: Secondary | ICD-10-CM | POA: Diagnosis present

## 2017-07-20 DIAGNOSIS — J9621 Acute and chronic respiratory failure with hypoxia: Secondary | ICD-10-CM | POA: Diagnosis present

## 2017-07-20 DIAGNOSIS — Z87891 Personal history of nicotine dependence: Secondary | ICD-10-CM | POA: Diagnosis not present

## 2017-07-20 DIAGNOSIS — G250 Essential tremor: Secondary | ICD-10-CM | POA: Diagnosis present

## 2017-07-20 DIAGNOSIS — J189 Pneumonia, unspecified organism: Principal | ICD-10-CM | POA: Diagnosis present

## 2017-07-20 DIAGNOSIS — I2510 Atherosclerotic heart disease of native coronary artery without angina pectoris: Secondary | ICD-10-CM | POA: Diagnosis present

## 2017-07-20 DIAGNOSIS — R0602 Shortness of breath: Secondary | ICD-10-CM | POA: Diagnosis not present

## 2017-07-20 DIAGNOSIS — M25551 Pain in right hip: Secondary | ICD-10-CM | POA: Diagnosis present

## 2017-07-20 DIAGNOSIS — E039 Hypothyroidism, unspecified: Secondary | ICD-10-CM | POA: Diagnosis present

## 2017-07-20 DIAGNOSIS — W19XXXA Unspecified fall, initial encounter: Secondary | ICD-10-CM | POA: Diagnosis present

## 2017-07-20 DIAGNOSIS — H9190 Unspecified hearing loss, unspecified ear: Secondary | ICD-10-CM | POA: Diagnosis present

## 2017-07-20 DIAGNOSIS — Z66 Do not resuscitate: Secondary | ICD-10-CM | POA: Diagnosis present

## 2017-07-20 DIAGNOSIS — M25559 Pain in unspecified hip: Secondary | ICD-10-CM

## 2017-07-20 DIAGNOSIS — R918 Other nonspecific abnormal finding of lung field: Secondary | ICD-10-CM | POA: Diagnosis present

## 2017-07-20 DIAGNOSIS — K219 Gastro-esophageal reflux disease without esophagitis: Secondary | ICD-10-CM | POA: Diagnosis present

## 2017-07-20 DIAGNOSIS — Z96642 Presence of left artificial hip joint: Secondary | ICD-10-CM | POA: Diagnosis present

## 2017-07-20 DIAGNOSIS — Z79899 Other long term (current) drug therapy: Secondary | ICD-10-CM

## 2017-07-20 LAB — CBC
HCT: 39.5 % — ABNORMAL LOW (ref 40.0–52.0)
Hemoglobin: 13.6 g/dL (ref 13.0–18.0)
MCH: 31.7 pg (ref 26.0–34.0)
MCHC: 34.5 g/dL (ref 32.0–36.0)
MCV: 92 fL (ref 80.0–100.0)
PLATELETS: 208 10*3/uL (ref 150–440)
RBC: 4.3 MIL/uL — ABNORMAL LOW (ref 4.40–5.90)
RDW: 13.5 % (ref 11.5–14.5)
WBC: 6.6 10*3/uL (ref 3.8–10.6)

## 2017-07-20 LAB — POTASSIUM: POTASSIUM: 4.9 mmol/L (ref 3.5–5.1)

## 2017-07-20 LAB — COMPREHENSIVE METABOLIC PANEL
ALBUMIN: 3.4 g/dL — AB (ref 3.5–5.0)
ALK PHOS: 201 U/L — AB (ref 38–126)
ALT: 67 U/L — ABNORMAL HIGH (ref 17–63)
ANION GAP: 7 (ref 5–15)
AST: 65 U/L — ABNORMAL HIGH (ref 15–41)
BUN: 11 mg/dL (ref 6–20)
CALCIUM: 8.4 mg/dL — AB (ref 8.9–10.3)
CHLORIDE: 85 mmol/L — AB (ref 101–111)
CO2: 22 mmol/L (ref 22–32)
Creatinine, Ser: 0.8 mg/dL (ref 0.61–1.24)
GFR calc non Af Amer: >60 mL/min (ref >60)
GLUCOSE: 86 mg/dL (ref 65–99)
POTASSIUM: 5.2 mmol/L — AB (ref 3.5–5.1)
SODIUM: 114 mmol/L — AB (ref 135–145)
Total Bilirubin: 1.8 mg/dL — ABNORMAL HIGH (ref 0.3–1.2)
Total Protein: 6.2 g/dL — ABNORMAL LOW (ref 6.5–8.1)

## 2017-07-20 LAB — TROPONIN I

## 2017-07-20 LAB — BRAIN NATRIURETIC PEPTIDE: B NATRIURETIC PEPTIDE 5: 258 pg/mL — AB (ref 0.0–100.0)

## 2017-07-20 LAB — CK: Total CK: 84 U/L (ref 49–397)

## 2017-07-20 MED ORDER — ACETAMINOPHEN 325 MG PO TABS: 650.0000 mg | ORAL_TABLET | Freq: Four times a day (QID) | ORAL | Status: DC | PRN

## 2017-07-20 MED ORDER — DEXTROSE 5 % IV SOLN
1.0000 g | Freq: Once | INTRAVENOUS | Status: AC
  Administered 2017-07-20: 1 g via INTRAVENOUS
  Filled 2017-07-20: qty 10

## 2017-07-20 MED ORDER — DEXTROSE 5 % IV SOLN
500.0000 mg | Freq: Once | INTRAVENOUS | Status: AC
  Administered 2017-07-20: 500 mg via INTRAVENOUS
  Filled 2017-07-20: qty 500

## 2017-07-20 MED ORDER — ONDANSETRON HCL 4 MG/2ML IJ SOLN: 4.0000 mg | Freq: Four times a day (QID) | INTRAMUSCULAR | Status: DC | PRN

## 2017-07-20 MED ORDER — SODIUM CHLORIDE 0.9 % IV SOLN
Freq: Once | INTRAVENOUS | Status: AC
  Administered 2017-07-20: 12:00:00 via INTRAVENOUS

## 2017-07-20 MED ORDER — SODIUM CHLORIDE 0.9 % IV SOLN
INTRAVENOUS | Status: DC
  Administered 2017-07-20 (×2): via INTRAVENOUS

## 2017-07-20 MED ORDER — BISACODYL 5 MG PO TBEC
5.0000 mg | DELAYED_RELEASE_TABLET | Freq: Every day | ORAL | Status: DC | PRN
  Administered 2017-07-25 – 2017-07-26 (×2): 5 mg via ORAL
  Filled 2017-07-20 (×2): qty 1

## 2017-07-20 MED ORDER — ALBUTEROL SULFATE (2.5 MG/3ML) 0.083% IN NEBU: 2.5000 mg | INHALATION_SOLUTION | Freq: Four times a day (QID) | RESPIRATORY_TRACT | Status: DC | PRN

## 2017-07-20 MED ORDER — PANTOPRAZOLE SODIUM 40 MG PO TBEC
40.0000 mg | DELAYED_RELEASE_TABLET | Freq: Every day | ORAL | Status: DC
  Administered 2017-07-21 – 2017-07-26 (×6): 40 mg via ORAL
  Filled 2017-07-20 (×6): qty 1

## 2017-07-20 MED ORDER — IPRATROPIUM-ALBUTEROL 0.5-2.5 (3) MG/3ML IN SOLN
3.0000 mL | Freq: Two times a day (BID) | RESPIRATORY_TRACT | Status: DC
  Administered 2017-07-20 – 2017-07-21 (×2): 3 mL via RESPIRATORY_TRACT
  Filled 2017-07-20 (×2): qty 3

## 2017-07-20 MED ORDER — DEXTROSE 5 % IV SOLN
1.0000 g | INTRAVENOUS | Status: DC
  Administered 2017-07-21 – 2017-07-22 (×2): 1 g via INTRAVENOUS
  Filled 2017-07-20 (×2): qty 10

## 2017-07-20 MED ORDER — AZITHROMYCIN 500 MG IV SOLR
500.0000 mg | INTRAVENOUS | Status: DC
  Administered 2017-07-21 – 2017-07-22 (×2): 500 mg via INTRAVENOUS
  Filled 2017-07-20 (×2): qty 500

## 2017-07-20 MED ORDER — ACETAMINOPHEN 650 MG RE SUPP: 650.0000 mg | Freq: Four times a day (QID) | RECTAL | Status: DC | PRN

## 2017-07-20 MED ORDER — DOCUSATE SODIUM 100 MG PO CAPS
100.0000 mg | ORAL_CAPSULE | Freq: Two times a day (BID) | ORAL | Status: DC
  Administered 2017-07-22 – 2017-07-26 (×9): 100 mg via ORAL
  Filled 2017-07-20 (×10): qty 1

## 2017-07-20 MED ORDER — TRAZODONE HCL 50 MG PO TABS
25.0000 mg | ORAL_TABLET | Freq: Every evening | ORAL | Status: DC | PRN
  Administered 2017-07-22: 25 mg via ORAL
  Filled 2017-07-20: qty 1

## 2017-07-20 MED ORDER — ONDANSETRON HCL 4 MG PO TABS
4.0000 mg | ORAL_TABLET | Freq: Four times a day (QID) | ORAL | Status: DC | PRN
Start: 2017-07-20 — End: 2017-07-26

## 2017-07-20 MED ORDER — SODIUM POLYSTYRENE SULFONATE 15 GM/60ML PO SUSP
30.0000 g | Freq: Once | ORAL | Status: AC
  Administered 2017-07-20: 30 g via ORAL
  Filled 2017-07-20: qty 120

## 2017-07-20 MED ORDER — LEVOTHYROXINE SODIUM 50 MCG PO TABS
75.0000 ug | ORAL_TABLET | Freq: Every day | ORAL | Status: DC
  Administered 2017-07-22 – 2017-07-26 (×5): 75 ug via ORAL
  Filled 2017-07-20 (×3): qty 1
  Filled 2017-07-20 (×2): qty 2

## 2017-07-20 MED ORDER — HEPARIN SODIUM (PORCINE) 5000 UNIT/ML IJ SOLN
5000.0000 [IU] | Freq: Three times a day (TID) | INTRAMUSCULAR | Status: DC
  Administered 2017-07-20 – 2017-07-22 (×5): 5000 [IU] via SUBCUTANEOUS
  Filled 2017-07-20 (×5): qty 1

## 2017-07-20 MED ORDER — METOPROLOL SUCCINATE ER 25 MG PO TB24
25.0000 mg | ORAL_TABLET | Freq: Every day | ORAL | Status: DC
  Administered 2017-07-21 – 2017-07-26 (×6): 25 mg via ORAL
  Filled 2017-07-20 (×6): qty 1

## 2017-07-20 MED ORDER — ATORVASTATIN CALCIUM 20 MG PO TABS
20.0000 mg | ORAL_TABLET | Freq: Every day | ORAL | Status: DC
  Administered 2017-07-20 – 2017-07-22 (×3): 20 mg via ORAL
  Filled 2017-07-20 (×3): qty 1

## 2017-07-20 NOTE — ED Triage Notes (Signed)
Pt brought in by his brothers stating that pt has had fatigue going on for over a mth. Pt states he feels tired and has some shortness of breath as well. Pt denies pain anywhere at this time. VSS.

## 2017-07-20 NOTE — Telephone Encounter (Signed)
S/w pt's son, Barbaraann Rondo. Pt has been active as recently as two weeks ago, helping with yard work and feeling well but has been declining for a week. Pt fell last Wednesday while at [facility] with friends.  Friday morning he experienced speech slurred, inability to walk,  one eye/eye lid weaker. Saturday he was at a birthday party and was noted to be lethargic with difficulty moving around. He fell again last night and has been getting progressively worse. Son thinks he may have had a TIA and asking if pt should see Dr. Rockey Situ. Advised son to have pt proceed to ER now for TIA symptoms. Barbaraann Rondo verbalized understanding and is agreeable w/plan.

## 2017-07-20 NOTE — Telephone Encounter (Signed)
Pt son calling stating on Wednesday they went to [place] patient had a fall. Then on Friday his brother calls him stating patient speech was slurred and he was out of energy  He sates about 2 weeks ago he went to patients home and was cutting tree down patient was helping out with that perfectly fine, he didn't even use his cane.   Now all the sudden this. They think he may have had a mini stroke. He read online about this. He is very concerned about this.  Please advise.

## 2017-07-20 NOTE — Consult Note (Signed)
Name: Jeffrey Haynes MRN: [id number] DOB: 1925-09-09    ADMISSION DATE:  07/20/2017 CONSULTATION DATE: 07/20/2017  REFERRING MD :  Dr. Manuella Ghazi  CHIEF COMPLAINT: Fatigue and Shortness of Breath   BRIEF PATIENT DESCRIPTION:  83 year old male admitted 08/13 due to acute on chronic respiratory failure secondary to pneumonia and AECOPD with incidental finding of a lump mass on CT scan concerning for malignancy and extensive pulmonary fibrosis.   SIGNIFICANT EVENTS  08/13-Pt admitted to the telemetry unit   STUDIES:  CT Chest 08/13>>Apparent mass in the posterior segment of the left upper lobe with areas of nearby airspace consolidation in the posterior segment left upper lobe and superior segment left lower lobe. This mass-like area measures 4.4 x 3.6 cm. Multifocal adenopathy throughout the mediastinum and hilar regions. Neoplasm is suspected. Given this circumstance, correlation with nuclear medicine PET study may well be advised. Extensive underlying pulmonary fibrosis. Multiple areas of scarring. Small calcified granulomas in the superior segment left lower lobe. Note that the degree of pulmonary fibrosis has progressed since prior study. Foci of left diaphragmatic calcification, likely due to previous asbestos exposure. Hiatal hernia present. Foci of atherosclerotic calcification aorta as well as foci of coronary artery calcification. CT Head 08/13>>Mild atrophy with patchy periventricular small vessel disease, stable. No intracranial mass, hemorrhage, or evidence of infarct. No extra-axial fluid collection. Foci of arterial vascular calcification noted. There is mild inferior mastoid air cell disease on the right as well as mucosal thickening in several ethmoid air cells.  HISTORY OF PRESENT ILLNESS:   This is a 83 year old male with a past medical history of atrial fibrillation, orthostatic hypotension, hypothyroidism, former smoker, hyperlipidemia, tremor, hypertension, COPD, CAD, and bleeding  gastric ulcer.  He presented to [facility] ER 08/13 with generalized weakness and shortness of breath. Per ER notes the patient had a fall 07/01/2017 and has had a significant decline in ability to perform ADLs since.  He now requires a walker during ambulation and today the patient could not get up even with a walker prompting ER visit. He has also had worsening shortness of breath over the last several days with cough and poor po intake. He also had a fall the night of 08/12.   In the ER CT Chest revealed new large lung mass concerning for malignancy and extensive pulmonary fibrosis, therefore PCCM consulted.    PAST MEDICAL HISTORY :   has a past medical history of Bleeding ulcer (09/2012); CAD (coronary artery disease); COPD (chronic obstructive pulmonary disease) ([place]); Essential hypertension; Familial tremor; Hyperlipidemia; Hypothyroidism; Orthostatic hypotension; and PAF (paroxysmal atrial fibrillation) ([place]).  has a past surgical history that includes Hernia repair; Spine surgery (1975); Hip Arthroplasty (Left, 05/02/2016); and Hip surgery (Left, 05/02/2016). Prior to Admission medications   Medication Sig Start Date End Date Taking? Authorizing Provider  acetaminophen (TYLENOL) 500 MG tablet Take 500 mg by mouth every 6 (six) hours as needed.   Yes [provider]  albuterol (PROVENTIL HFA;VENTOLIN HFA) 108 (90 Base) MCG/ACT inhaler Inhale 2 puffs into the lungs every 6 (six) hours as needed for wheezing or shortness of breath. 10/29/16  Yes Crissman, [name], MD  atorvastatin (LIPITOR) 20 MG tablet Take 1 tablet (20 mg total) by mouth at bedtime. 10/29/16  Yes Crissman, [name], MD  cetirizine (ZYRTEC) 10 MG tablet Take 10 mg by mouth daily.   Yes [provider]  Ipratropium-Albuterol (COMBIVENT) 20-100 MCG/ACT AERS respimat Inhale 2 puffs into the lungs 2 (two) times daily. 11/06/15  Yes  [name], MD  levothyroxine (SYNTHROID, LEVOTHROID) 75 MCG tablet Take 1 tablet (75 mcg  total) by mouth daily before breakfast. 10/29/16  Yes Crissman, [name], MD  metoprolol succinate (TOPROL-XL) 25 MG 24 hr tablet Take 1 tablet (25 mg total) by mouth daily. 10/29/16  Yes Crissman, [name], MD  pantoprazole (PROTONIX) 40 MG tablet Take 1 tablet (40 mg total) by mouth daily. 01/06/17  Yes Crissman, [name], MD  potassium chloride (K-DUR) 10 MEQ tablet Take 1 tablet (10 mEq total) by mouth daily. 03/23/17  Yes Gollan, Kathlene November, MD   No Known Allergies  FAMILY HISTORY:  Family history is unknown by patient. SOCIAL HISTORY:  reports that he quit smoking about 39 years ago. His smoking use included Cigarettes. He quit smokeless tobacco use about 4 years ago. His smokeless tobacco use included Chew. He reports that he does not drink alcohol or use drugs.  REVIEW OF SYSTEMS:  Positives in BOLD  Constitutional: Negative for fever, chills, weight loss, malaise/fatigue and diaphoresis.  HENT: Negative for hearing loss, ear pain, nosebleeds, congestion, sore throat, neck pain, tinnitus and ear discharge.   Eyes: Negative for blurred vision, double vision, photophobia, pain, discharge and redness.  Respiratory: cough, hemoptysis, sputum production, shortness of breath, wheezing and stridor.   Cardiovascular: Negative for chest pain, palpitations, orthopnea, claudication, leg swelling and PND.  Gastrointestinal: Negative for heartburn, nausea, vomiting, abdominal pain, diarrhea, constipation, blood in stool and melena.  Genitourinary: Negative for dysuria, urgency, frequency, hematuria and flank pain.  Musculoskeletal: Negative for myalgias, back pain, joint pain and falls.  Skin: Negative for itching and rash.  Neurological: Negative for dizziness, tingling, tremors, sensory change, speech change, focal weakness, seizures, loss of consciousness, weakness and headaches.  Endo/Heme/Allergies: Negative for environmental allergies and polydipsia. Does not bruise/bleed easily.  SUBJECTIVE:    Patient states he is still short of breath with wheezing and states he was told today he has lung cancer.  VITAL SIGNS: Temp:  [97.4 F (36.3 C)-97.5 F (36.4 C)] 97.5 F (36.4 C) (08/13 2000) Pulse Rate:  [52-59] 59 (08/13 2000) Resp:  [12-21] 18 (08/13 2000) BP: (125-147)/(60-75) 145/66 (08/13 2000) SpO2:  [96 %-100 %] 97 % (08/13 2209) Weight:  [86.2 kg (190 lb)] 86.2 kg (190 lb) (08/13 1651)  PHYSICAL EXAMINATION: General: Well-developed well-nourished elderly Caucasian male, NAD Neuro: Alert confused to situation at times, follows commands HEENT: Supple, no JVD Cardiovascular: S1-S2, sinus bradycardia, no M/R/G Lungs: Faint expiratory wheezes throughout, even, non labored; no rales, rhonchi, or crackles Abdomen: Positive bowel sounds 4, nondistended, nontender, soft Musculoskeletal: Normal bulk and tone, no edema Skin: No rashes or lesions   Recent Labs Lab 07/20/17 1134 07/20/17 1539  NA 114*  --   K 5.2* 4.9  CL 85*  --   CO2 22  --   BUN 11  --   CREATININE 0.80  --   GLUCOSE 86  --     Recent Labs Lab 07/20/17 1134  HGB 13.6  HCT 39.5*  WBC 6.6  PLT 208   Dg Chest 2 View  Result Date: 07/20/2017 CLINICAL DATA:  Shortness of breath. EXAM: CHEST  2 VIEW COMPARISON:  Chest x-ray dated May 02, 2016. FINDINGS: The cardiomediastinal silhouette is normal in size. Atherosclerotic calcification of the aortic arch. Diffuse reticulonodular interstitial markings, similar to prior study. New peripheral opacity in the left mid lung. Left basilar atelectasis. No pleural effusion or pneumothorax. No acute osseous abnormality. IMPRESSION: 1. New peripheral  opacity in the left mid lung, which could represent pneumonia in the proper clinical setting. Followup PA and lateral chest X-ray is recommended in 3-4 weeks following trial of antibiotic therapy to ensure resolution and exclude underlying malignancy. 2. Similar appearing fibrotic changes within the lungs. Electronically  Signed   By: Titus Dubin M.D.   On: 07/20/2017 11:41   Ct Head Wo Contrast  Result Date: 07/20/2017 CLINICAL DATA:  Fatigue and altered mental status EXAM: CT HEAD WITHOUT CONTRAST TECHNIQUE: Contiguous axial images were obtained from the base of the skull through the vertex without intravenous contrast. COMPARISON:  October 02, 2012 FINDINGS: Brain: There is mild diffuse atrophy. Right lateral ventricle is larger than the left lateral ventricle, a stable finding which may represent an anatomic variant. There is no intracranial mass, hemorrhage, extra-axial fluid collection, or midline shift. There is patchy small vessel disease in the centra semiovale bilaterally. No acute infarct evident. Vascular: No hyperdense vessel. There are foci of calcification in each carotid artery. Skull: Bony calvarium appears intact. Sinuses/Orbits: There is mucosal thickening in several ethmoid air cells bilaterally. Other visualized paranasal sinuses are clear. Orbits appear symmetric bilaterally. Other: There is opacification in several inferior mastoid air cells on the right. Mastoids elsewhere clear. IMPRESSION: Mild atrophy with patchy periventricular small vessel disease, stable. No intracranial mass, hemorrhage, or evidence of infarct. No extra-axial fluid collection. Foci of arterial vascular calcification noted. There is mild inferior mastoid air cell disease on the right as well as mucosal thickening in several ethmoid air cells. Electronically Signed   By: Lowella Grip III M.D.   On: 07/20/2017 11:27   Ct Chest Wo Contrast  Result Date: 07/20/2017 CLINICAL DATA:  Progressive shortness of breath. Abnormal chest radiograph EXAM: CT CHEST WITHOUT CONTRAST TECHNIQUE: Multidetector CT imaging of the chest was performed following the standard protocol without IV contrast. COMPARISON:  Chest radiograph July 20, 2017; chest CT May 04, 2016 FINDINGS: Cardiovascular: There is no appreciable thoracic aortic  aneurysm. There is atherosclerotic calcification in the proximal visualized great vessels. There is atherosclerotic calcification throughout the aorta. There are multiple foci of coronary artery calcification. Pericardium is not appreciably thickened. Mediastinum/Nodes: Thyroid appears unremarkable. There is extensive adenopathy throughout the mediastinum. In the right pretracheal region, there is a lymph node measuring 2.8 x 2.4 cm. There are multiple lymph nodes adjacent to the aortic arch on the left. There is a focal lymph node in the aortopulmonary window measuring 1.9 x 1.8 cm. There are lymph nodes tracking anterior to the left main bronchus, largest measuring 2.0 x 1.2 cm. There is an adjacent lymph node measuring 1.6 x 1.0 cm. There are lymph nodes in the left hilum which are difficult to distinguish from adjacent vessel in the absence of contrast. There is a focal right hilar lymph node measuring 1.3 x 1.1 cm. There is a sub- carinal lymph node measuring 2.6 x 1.4 cm. There is a small hiatal hernia. Lungs/Pleura: There is widespread pulmonary fibrotic type change, slightly more severe overall compared to the prior study. There is an area that appears masslike in the posterior segment of the left upper lobe measuring 4.4 x 3.6 cm. There is nearby airspace consolidation in the posterior segment of the left upper lobe and in the superior segment of the left lower lobe. Areas of opacity in the apices are stable and likely represent chronic scarring and fibrosis as opposed to more acute pneumonia. There is chronic pleural thickening on the left posteriorly and inferiorly.  There is no free-flowing effusion. There are foci of calcification along the left hemidiaphragm. There are small calcified granulomas in the superior segment of the left lower lobe peer Upper Abdomen: There is atherosclerotic calcification in the upper abdominal aorta. A slight amount of calcification is noted in the left adrenal. No adrenal  masses are evident. There is no upper abdominal adenopathy. Musculoskeletal: There is degenerative change in the thoracic spine. There are no blastic or lytic bone lesions. IMPRESSION: 1. Apparent mass in the posterior segment of the left upper lobe with areas of nearby airspace consolidation in the posterior segment left upper lobe and superior segment left lower lobe. This masslike area measures 4.4 x 3.6 cm. 2. Multifocal adenopathy throughout the mediastinum and hilar regions. Neoplasm is suspected. Given this circumstance, correlation with nuclear medicine PET study may well be advised. 3. Extensive underlying pulmonary fibrosis. Multiple areas of scarring. Small calcified granulomas in the superior segment left lower lobe. Note that the degree of pulmonary fibrosis has progressed since prior study. 4. Foci of left diaphragmatic calcification, likely due to previous asbestos exposure. 5.  Hiatal hernia present. 6. Foci of atherosclerotic calcification aorta as well as foci of coronary artery calcification. Aortic Atherosclerosis (ICD10-I70.0). Electronically Signed   By: Lowella Grip III M.D.   On: 07/20/2017 13:02    ASSESSMENT / PLAN: Acute on chronic respiratory failure secondary to pneumonia and AECOPD  Incidental finding on CT chest new large lung mass and extensive pulmonary fibrosis (07/20/17) Hx: Former smoker P: Supplemental O2 for dyspnea and to maintain O2 sats 88% to 92% Will add nebulized and IV steroids Continue bronchodilator therapy Agree with palliative care consult to discuss goals of care Depending on palliative care discussion regarding goals of treatment if family wants aggressive treatment patient will need bronchoscopy for lung biopsy   Marda Stalker, [place] Pager [phone] (please enter 7 digits) PCCM Consult Pager [phone] (please enter 7 digits)

## 2017-07-20 NOTE — ED Provider Notes (Signed)
[facility] Emergency Department Provider Note  Time seen: 11:08 AM  I have reviewed the triage vital signs and the nursing notes.   HISTORY  Chief Complaint Fatigue and Shortness of Breath    HPI Jeffrey Haynes is a 83 y.o. male With a past medical history of COPD, hypertension, hyperlipidemia, paroxysmal atrial fibrillation, who presents to the emergency departmentwith generalized weakness. According to family little over 2 weeks ago the patient was ambulating without any assistance, no cane or walker. Was doing yard work. Had a fall on July 25. Since that time the patient has had progressive steep decline in function. With significant weakness. Was using a cane, then required a walker, today the patient could not get up even with a walker. Patient's only complaint is shortness of breath, has a history of COPD but states his shortness of breath has worsened over the past several days. Denies any focal weakness or numbness. Denies any headache. Patient is alert and oriented.  Past Medical History:  Diagnosis Date  . Bleeding ulcer 09/2012   a. gastric  . CAD (coronary artery disease)    a. Reported hx.  [name] COPD (chronic obstructive pulmonary disease) ([place])   . Essential hypertension   . Familial tremor   . Hyperlipidemia   . Hypothyroidism   . Orthostatic hypotension    a. leading to prior syncope  . PAF (paroxysmal atrial fibrillation) ([facility])    a. in the post-op setting and associated with COPD exacerbation-->amio 200 daily & eliquis 5 bid.    Patient Active Problem List   Diagnosis Date Noted  . Decreased hearing of both ears 10/29/2016  . New onset atrial fibrillation ([place]) 05/06/2016  . Respiratory distress 05/06/2016  . Fall   . Status post hip hemiarthroplasty   . Femur fracture ([place]) 05/02/2016  . COPD (chronic obstructive pulmonary disease) ([place]) 11/06/2015  . CAD (coronary artery disease) 11/06/2015  . Hypothyroidism 11/06/2015  .  Hyperlipidemia 11/06/2015  . Familial tremor 11/06/2015  . CKD (chronic kidney disease) stage 3, GFR 30-59 ml/min 11/06/2015    Past Surgical History:  Procedure Laterality Date  . HERNIA REPAIR    . HIP ARTHROPLASTY Left 05/02/2016   Procedure: ARTHROPLASTY BIPOLAR HIP (HEMIARTHROPLASTY);  Surgeon: [name], MD;  Location: [facility] ORS;  Service: Orthopedics;  Laterality: Left;  . HIP SURGERY Left 05/02/2016  . SPINE SURGERY  1975   LS disk    Prior to Admission medications   Medication Sig Start Date End Date Taking? Authorizing Provider  acetaminophen (TYLENOL) 500 MG tablet Take 500 mg by mouth every 6 (six) hours as needed.   Yes [provider]  albuterol (PROVENTIL HFA;VENTOLIN HFA) 108 (90 Base) MCG/ACT inhaler Inhale 2 puffs into the lungs every 6 (six) hours as needed for wheezing or shortness of breath. 10/29/16  Yes Crissman, [name], MD  atorvastatin (LIPITOR) 20 MG tablet Take 1 tablet (20 mg total) by mouth at bedtime. 10/29/16  Yes Crissman, [name], MD  cetirizine (ZYRTEC) 10 MG tablet Take 10 mg by mouth daily.   Yes [provider]  Ipratropium-Albuterol (COMBIVENT) 20-100 MCG/ACT AERS respimat Inhale 2 puffs into the lungs 2 (two) times daily. 11/06/15  Yes Crissman, [name], MD  levothyroxine (SYNTHROID, LEVOTHROID) 75 MCG tablet Take 1 tablet (75 mcg total) by mouth daily before breakfast. 10/29/16  Yes Crissman, [name], MD  metoprolol succinate (TOPROL-XL) 25 MG 24 hr tablet Take 1 tablet (25 mg total) by mouth daily.  10/29/16  Yes Crissman, [name], MD  pantoprazole (PROTONIX) 40 MG tablet Take 1 tablet (40 mg total) by mouth daily. 01/06/17  Yes Crissman, [name], MD  potassium chloride (K-DUR) 10 MEQ tablet Take 1 tablet (10 mEq total) by mouth daily. 03/23/17  Yes Minna Merritts, MD    No Known Allergies  Family History  Problem Relation Age of Onset  . Family history unknown: Yes    Social History Social History  Substance Use Topics  .  Smoking status: Former Smoker    Types: Cigarettes    Quit date: 05/02/1978  . Smokeless tobacco: Former Systems developer    Types: Alpha date: 10/02/2012  . Alcohol use No    Review of Systems Constitutional: Negative for fever. Cardiovascular: Negative for chest pain. Respiratory: Negative for shortness of breath. Gastrointestinal: Negative for abdominal pain. Denies vomiting or diarrhea. Genitourinary: Negative for dysuria. Musculoskeletal: Negative for back pain.was complaining of leg pain bilaterally yesterday per family. Neurological: Negative for headache All other ROS negative  ____________________________________________   PHYSICAL EXAM:  VITAL SIGNS: ED Triage Vitals  Enc Vitals Group     BP 07/20/17 1002 125/62     Pulse Rate 07/20/17 1002 (!) 56     Resp 07/20/17 1002 18     Temp 07/20/17 1002 (!) 97.4 F (36.3 C)     Temp Source 07/20/17 1002 Oral     SpO2 07/20/17 1002 98 %     Weight 07/20/17 1003 190 lb (86.2 kg)     Height --      Head Circumference --      Peak Flow --      Pain Score --      Pain Loc --      Pain Edu? --      Excl. in [place]? --     Constitutional: Alert and oriented.  Eyes: Normal exam ENT   Head: Normocephalic and atraumatic.   Mouth/Throat: Mucous membranes are moist. Cardiovascular: Normal rate, regular rhythm. No murmurs, rubs, or gallops. Respiratory: Normal respiratory effort without tachypnea nor retractions. Breath sounds are clear and equal bilaterally. No wheezes/rales/rhonchi. Gastrointestinal: Soft and nontender. No distention.   Musculoskeletal: Nontender with normal range of motion in all extremities. No lower extremity tenderness or edema. Neurologic:  Normal speech and language. No gross focal neurologic deficits Equal grip strengths bilaterally. 5/5 motor in all extremities. No appreciable cranial nerve deficits. Skin:  Skin is warm, dry and intact.  Psychiatric: Mood and affect are normal.    ____________________________________________    EKG  EKG reviewed and interpreted by myself shows sinus bradycardia at 55 bpm, narrow QRS, normal axis, normal, no concerning ST changes.  ____________________________________________    RADIOLOGY  chest x-ray shows left midlung opacity.  ____________________________________________   INITIAL IMPRESSION / ASSESSMENT AND PLAN / ED COURSE  Pertinent labs & imaging results that were available during my care of the patient were reviewed by me and considered in my medical decision making (see chart for details).  patient presents to the emergency department for generalized weakness, shortness of breath. Family states 3 weeks he or the patient was doing yard work with no assistance, dragging branches to the Road climbing flights of stairs. Today the patient was unable to get up even with his walker. Given a recent fall 2 weeks ago we'll obtain a CT scan of the head. Given his shortness of breath complaint we will check labs including troponin, BNP, and obtain a  chest x-ray. Patient's EKG is reassuring. Patient's exam is overall well. He has good grip strength, 5/5 motor in all extremities.family does state they've noticed some mild slurred speech recently as well.  chest x-ray shows left midlung opacity. Patient's labs resulted significant for hyponatremia 114. We will start on sodium infusion. Given the patient's left lung opacity with hyponatremia we'll obtain a CT scan to rule out left lung mass. We will cover with antibiotics. ____________________________________________   FINAL CLINICAL IMPRESSION(S) / ED DIAGNOSES  community acquired pneumonia Hyponatremia [address], MD 07/20/17 1237

## 2017-07-20 NOTE — Progress Notes (Signed)
MD shah was notified of k of 4.9. No further orders at this time.

## 2017-07-20 NOTE — H&P (Signed)
[facility] [place] NAME: Josey Forcier    MR#:  [id number]  DATE OF BIRTH:  1925/06/01  DATE OF ADMISSION:  07/20/2017  PRIMARY CARE PHYSICIAN: [name], MD   REQUESTING/REFERRING PHYSICIAN: Harvest Dark, MD  CHIEF COMPLAINT:   Chief Complaint  Patient presents with  . Fatigue  . Shortness of Breath    HISTORY OF PRESENT ILLNESS:  Tami Barren  is a 83 y.o. male with a known history of COPD, hypertension, hyperlipidemia, paroxysmal atrial fibrillation being admitted For severe hyponatremia, hyperkalemia, and new lung mass.  Is and is very hard of hearing and is not able to provide much history.. Patient's son is at the bedside who is providing most of the information.  He is brought into the emergency department with generalized weakness. According to family little over 2 weeks ago the patient was ambulating without any assistance, no cane or walker. Was doing yard work. Had a fall on July 25. Since that time the patient has had progressive steep decline in function. With significant weakness. Was using a cane, then required a walker, today the patient could not get up even with a walker. Patient's only complaint is shortness of breath, has a history of COPD but states his shortness of breath has worsened over the past several days.   Saturday he was at a birthday party and was noted to be lethargic with difficulty moving around. He fell again last night and has been getting progressively worse.  He has very poor by mouth intake.  Does report some yellow phlegm.  Cough.  Her younger brother lives with him.. While in the ED, considering severe hyponatremia.  He underwent CT of the chest which shows large lung mass.  He is being admitted for further evaluation and management. PAST MEDICAL HISTORY:   Past Medical History:  Diagnosis Date  . Bleeding ulcer 09/2012   a. gastric  . CAD (coronary artery disease)    a. Reported hx.  [name]  COPD (chronic obstructive pulmonary disease) ([place])   . Essential hypertension   . Familial tremor   . Hyperlipidemia   . Hypothyroidism   . Orthostatic hypotension    a. leading to prior syncope  . PAF (paroxysmal atrial fibrillation) ([facility])    a. in the post-op setting and associated with COPD exacerbation-->amio 200 daily & eliquis 5 bid.    PAST SURGICAL HISTORY:   Past Surgical History:  Procedure Laterality Date  . HERNIA REPAIR    . HIP ARTHROPLASTY Left 05/02/2016   Procedure: ARTHROPLASTY BIPOLAR HIP (HEMIARTHROPLASTY);  Surgeon: [name], MD;  Location: [facility] ORS;  Service: Orthopedics;  Laterality: Left;  . HIP SURGERY Left 05/02/2016  . SPINE SURGERY  1975   LS disk    SOCIAL HISTORY:   Social History  Substance Use Topics  . Smoking status: Former Smoker    Types: Cigarettes    Quit date: 05/02/1978  . Smokeless tobacco: Former Systems developer    Types: [place] date: 10/02/2012  . Alcohol use No    FAMILY HISTORY:   Family History  Problem Relation Age of Onset  . Family history unknown: Yes  3 brothers with colon cancer 2 other sisters also have some cancer  DRUG ALLERGIES:  No Known Allergies  REVIEW OF SYSTEMS:   Review of Systems  Constitutional: Positive for malaise/fatigue. Negative for chills, fever and weight loss.  HENT: Negative for nosebleeds and sore throat.  Eyes: Negative for blurred vision.  Respiratory: Positive for cough and sputum production. Negative for shortness of breath and wheezing.   Cardiovascular: Negative for chest pain, orthopnea, leg swelling and PND.  Gastrointestinal: Negative for abdominal pain, constipation, diarrhea, heartburn, nausea and vomiting.  Genitourinary: Negative for dysuria and urgency.  Musculoskeletal: Negative for back pain.  Skin: Negative for rash.  Neurological: Positive for weakness. Negative for dizziness, speech change, focal weakness and headaches.  Endo/Heme/Allergies: Does not bruise/bleed  easily.  Psychiatric/Behavioral: Negative for depression.   MEDICATIONS AT HOME:   Prior to Admission medications   Medication Sig Start Date End Date Taking? Authorizing Provider  acetaminophen (TYLENOL) 500 MG tablet Take 500 mg by mouth every 6 (six) hours as needed.   Yes [provider]  albuterol (PROVENTIL HFA;VENTOLIN HFA) 108 (90 Base) MCG/ACT inhaler Inhale 2 puffs into the lungs every 6 (six) hours as needed for wheezing or shortness of breath. 10/29/16  Yes Crissman, [name], MD  atorvastatin (LIPITOR) 20 MG tablet Take 1 tablet (20 mg total) by mouth at bedtime. 10/29/16  Yes Crissman, [name], MD  cetirizine (ZYRTEC) 10 MG tablet Take 10 mg by mouth daily.   Yes [provider]  Ipratropium-Albuterol (COMBIVENT) 20-100 MCG/ACT AERS respimat Inhale 2 puffs into the lungs 2 (two) times daily. 11/06/15  Yes Crissman, [name], MD  levothyroxine (SYNTHROID, LEVOTHROID) 75 MCG tablet Take 1 tablet (75 mcg total) by mouth daily before breakfast. 10/29/16  Yes Crissman, [name], MD  metoprolol succinate (TOPROL-XL) 25 MG 24 hr tablet Take 1 tablet (25 mg total) by mouth daily. 10/29/16  Yes Crissman, [name], MD  pantoprazole (PROTONIX) 40 MG tablet Take 1 tablet (40 mg total) by mouth daily. 01/06/17  Yes Crissman, [name], MD  potassium chloride (K-DUR) 10 MEQ tablet Take 1 tablet (10 mEq total) by mouth daily. 03/23/17  Yes Gollan, Kathlene November, MD      VITAL SIGNS:  Blood pressure (!) 147/61, pulse (!) 55, temperature (!) 97.4 F (36.3 C), temperature source Oral, resp. rate 12, weight 86.2 kg (190 lb), SpO2 98 %.  PHYSICAL EXAMINATION:  Physical Exam  GENERAL:  83 y.o.-year-old patient lying in the bed with no acute distress. Decreased hearing EYES: Pupils equal, round, reactive to light and accommodation. No scleral icterus. Extraocular muscles intact.  HEENT: Head atraumatic, normocephalic. Oropharynx and nasopharynx clear.  NECK:  Supple, no jugular venous  distention. No thyroid enlargement, no tenderness.  LUNGS: Decreased breath sounds bilaterally, no wheezing, rales,rhonchi or crepitation. No use of accessory muscles of respiration.  CARDIOVASCULAR: S1, S2 normal. No murmurs, rubs, or gallops.  ABDOMEN: Soft, nontender, nondistended. Bowel sounds present. No organomegaly or mass.  EXTREMITIES: No pedal edema, cyanosis, or clubbing.  NEUROLOGIC: Cranial nerves II through XII are intact. Muscle strength 4/5 in all extremities. Sensation intact. Gait not checked.  PSYCHIATRIC: The patient is alert and oriented x 3.  SKIN: No obvious rash, lesion, or ulcer.  LABORATORY PANEL:   CBC  Recent Labs Lab 07/20/17 1134  WBC 6.6  HGB 13.6  HCT 39.5*  PLT 208   ------------------------------------------------------------------------------------------------------------------  Chemistries   Recent Labs Lab 07/20/17 1134  NA 114*  K 5.2*  CL 85*  CO2 22  GLUCOSE 86  BUN 11  CREATININE 0.80  CALCIUM 8.4*  AST 65*  ALT 67*  ALKPHOS 201*  BILITOT 1.8*   ------------------------------------------------------------------------------------------------------------------  Cardiac Enzymes  Recent Labs Lab 07/20/17 1134  TROPONINI <0.03   ------------------------------------------------------------------------------------------------------------------  RADIOLOGY:  Dg Chest 2 View  Result Date: 07/20/2017 CLINICAL DATA:  Shortness of breath. EXAM: CHEST  2 VIEW COMPARISON:  Chest x-ray dated May 02, 2016. FINDINGS: The cardiomediastinal silhouette is normal in size. Atherosclerotic calcification of the aortic arch. Diffuse reticulonodular interstitial markings, similar to prior study. New peripheral opacity in the left mid lung. Left basilar atelectasis. No pleural effusion or pneumothorax. No acute osseous abnormality. IMPRESSION: 1. New peripheral opacity in the left mid lung, which could represent pneumonia in the proper clinical  setting. Followup PA and lateral chest X-ray is recommended in 3-4 weeks following trial of antibiotic therapy to ensure resolution and exclude underlying malignancy. 2. Similar appearing fibrotic changes within the lungs. Electronically Signed   By: Titus Dubin M.D.   On: 07/20/2017 11:41   Ct Head Wo Contrast  Result Date: 07/20/2017 CLINICAL DATA:  Fatigue and altered mental status EXAM: CT HEAD WITHOUT CONTRAST TECHNIQUE: Contiguous axial images were obtained from the base of the skull through the vertex without intravenous contrast. COMPARISON:  October 02, 2012 FINDINGS: Brain: There is mild diffuse atrophy. Right lateral ventricle is larger than the left lateral ventricle, a stable finding which may represent an anatomic variant. There is no intracranial mass, hemorrhage, extra-axial fluid collection, or midline shift. There is patchy small vessel disease in the centra semiovale bilaterally. No acute infarct evident. Vascular: No hyperdense vessel. There are foci of calcification in each carotid artery. Skull: Bony calvarium appears intact. Sinuses/Orbits: There is mucosal thickening in several ethmoid air cells bilaterally. Other visualized paranasal sinuses are clear. Orbits appear symmetric bilaterally. Other: There is opacification in several inferior mastoid air cells on the right. Mastoids elsewhere clear. IMPRESSION: Mild atrophy with patchy periventricular small vessel disease, stable. No intracranial mass, hemorrhage, or evidence of infarct. No extra-axial fluid collection. Foci of arterial vascular calcification noted. There is mild inferior mastoid air cell disease on the right as well as mucosal thickening in several ethmoid air cells. Electronically Signed   By: Lowella Grip III M.D.   On: 07/20/2017 11:27   Ct Chest Wo Contrast  Result Date: 07/20/2017 CLINICAL DATA:  Progressive shortness of breath. Abnormal chest radiograph EXAM: CT CHEST WITHOUT CONTRAST TECHNIQUE:  Multidetector CT imaging of the chest was performed following the standard protocol without IV contrast. COMPARISON:  Chest radiograph July 20, 2017; chest CT May 04, 2016 FINDINGS: Cardiovascular: There is no appreciable thoracic aortic aneurysm. There is atherosclerotic calcification in the proximal visualized great vessels. There is atherosclerotic calcification throughout the aorta. There are multiple foci of coronary artery calcification. Pericardium is not appreciably thickened. Mediastinum/Nodes: Thyroid appears unremarkable. There is extensive adenopathy throughout the mediastinum. In the right pretracheal region, there is a lymph node measuring 2.8 x 2.4 cm. There are multiple lymph nodes adjacent to the aortic arch on the left. There is a focal lymph node in the aortopulmonary window measuring 1.9 x 1.8 cm. There are lymph nodes tracking anterior to the left main bronchus, largest measuring 2.0 x 1.2 cm. There is an adjacent lymph node measuring 1.6 x 1.0 cm. There are lymph nodes in the left hilum which are difficult to distinguish from adjacent vessel in the absence of contrast. There is a focal right hilar lymph node measuring 1.3 x 1.1 cm. There is a sub- carinal lymph node measuring 2.6 x 1.4 cm. There is a small hiatal hernia. Lungs/Pleura: There is widespread pulmonary fibrotic type change, slightly more severe overall compared to the prior study. There is  an area that appears masslike in the posterior segment of the left upper lobe measuring 4.4 x 3.6 cm. There is nearby airspace consolidation in the posterior segment of the left upper lobe and in the superior segment of the left lower lobe. Areas of opacity in the apices are stable and likely represent chronic scarring and fibrosis as opposed to more acute pneumonia. There is chronic pleural thickening on the left posteriorly and inferiorly. There is no free-flowing effusion. There are foci of calcification along the left hemidiaphragm. There  are small calcified granulomas in the superior segment of the left lower lobe peer Upper Abdomen: There is atherosclerotic calcification in the upper abdominal aorta. A slight amount of calcification is noted in the left adrenal. No adrenal masses are evident. There is no upper abdominal adenopathy. Musculoskeletal: There is degenerative change in the thoracic spine. There are no blastic or lytic bone lesions. IMPRESSION: 1. Apparent mass in the posterior segment of the left upper lobe with areas of nearby airspace consolidation in the posterior segment left upper lobe and superior segment left lower lobe. This masslike area measures 4.4 x 3.6 cm. 2. Multifocal adenopathy throughout the mediastinum and hilar regions. Neoplasm is suspected. Given this circumstance, correlation with nuclear medicine PET study may well be advised. 3. Extensive underlying pulmonary fibrosis. Multiple areas of scarring. Small calcified granulomas in the superior segment left lower lobe. Note that the degree of pulmonary fibrosis has progressed since prior study. 4. Foci of left diaphragmatic calcification, likely due to previous asbestos exposure. 5.  Hiatal hernia present. 6. Foci of atherosclerotic calcification aorta as well as foci of coronary artery calcification. Aortic Atherosclerosis (ICD10-I70.0). Electronically Signed   By: Lowella Grip III M.D.   On: 07/20/2017 13:02   IMPRESSION AND PLAN:  83 year old male ith a known history of COPD, hypertension, hyperlipidemia, paroxysmal atrial fibrillation being admitted For severe hyponatremia, hyperkalemia, and new lung mass  * Severe hyponatremia - can be due to SIADH from underlying lung cancer versus poor by mouth intake - Monitor with IV hydration  * Hyperkalemia: no EKG changes - give one dose of Kayexalate  * New lung mass - Consult pulmonary - Does have an extensive smoking history and extensive family history for cancer.    * Community-acquired pneumonia -  on IV Rocephin and Zithromax   Consult palliative care   All the records are reviewed and case discussed with ED provider. Management plans discussed with the patient, family and they are in agreement.  CODE STATUS: DO NOT RESUSCITATE  TOTAL TIME TAKING CARE OF THIS PATIENT: 45 minutes.    Max Sane M.D on 07/20/2017 at 1:52 PM  Between 7am to 6pm - Pager - [phone]  After 6pm go to [email or web address] - Proofreader  [facility]  Office  [phone]  CC: Primary care physician; [name], MD   Note: This dictation was prepared with Dragon dictation along with smaller phrase technology. Any transcriptional errors that result from this process are unintentional.

## 2017-07-20 NOTE — Progress Notes (Signed)
Family Meeting Note  Advance Directive:yes  Today a meeting took place with the Patient and son.  The following clinical team members were present during this meeting:MD  The following were discussed:Patient's diagnosis: , Patient's progosis: < 12 months and Goals for treatment: DNR  Additional follow-up to be provided: Palliative care eval - sun a lot and that number [phone].  Interested in keeping him comfort care only and not wanting to pursue much aggressive cancer care.  Time spent during discussion:20 minutes  Max Sane, MD

## 2017-07-21 ENCOUNTER — Telehealth: Payer: Self-pay

## 2017-07-21 DIAGNOSIS — Z515 Encounter for palliative care: Secondary | ICD-10-CM

## 2017-07-21 DIAGNOSIS — J189 Pneumonia, unspecified organism: Principal | ICD-10-CM

## 2017-07-21 DIAGNOSIS — Z7189 Other specified counseling: Secondary | ICD-10-CM

## 2017-07-21 DIAGNOSIS — R531 Weakness: Secondary | ICD-10-CM

## 2017-07-21 DIAGNOSIS — E871 Hypo-osmolality and hyponatremia: Secondary | ICD-10-CM

## 2017-07-21 LAB — GLUCOSE, CAPILLARY: Glucose-Capillary: 90 mg/dL (ref 65–99)

## 2017-07-21 LAB — CBC
HCT: 38.4 % — ABNORMAL LOW (ref 40.0–52.0)
Hemoglobin: 13.2 g/dL (ref 13.0–18.0)
MCH: 31.4 pg (ref 26.0–34.0)
MCHC: 34.5 g/dL (ref 32.0–36.0)
MCV: 91.2 fL (ref 80.0–100.0)
PLATELETS: 189 10*3/uL (ref 150–440)
RBC: 4.21 MIL/uL — ABNORMAL LOW (ref 4.40–5.90)
RDW: 13.6 % (ref 11.5–14.5)
WBC: 6.6 10*3/uL (ref 3.8–10.6)

## 2017-07-21 LAB — BASIC METABOLIC PANEL
Anion gap: 6 (ref 5–15)
BUN: 8 mg/dL (ref 6–20)
CO2: 22 mmol/L (ref 22–32)
Calcium: 7.5 mg/dL — ABNORMAL LOW (ref 8.9–10.3)
Chloride: 87 mmol/L — ABNORMAL LOW (ref 101–111)
Creatinine, Ser: 0.61 mg/dL (ref 0.61–1.24)
GFR calc Af Amer: >60 mL/min (ref >60)
GLUCOSE: 87 mg/dL (ref 65–99)
POTASSIUM: 4 mmol/L (ref 3.5–5.1)
Sodium: 115 mmol/L — CL (ref 135–145)

## 2017-07-21 LAB — SODIUM
SODIUM: 114 mmol/L — AB (ref 135–145)
Sodium: 115 mmol/L — CL (ref 135–145)

## 2017-07-21 MED ORDER — METHYLPREDNISOLONE SODIUM SUCC 40 MG IJ SOLR
20.0000 mg | Freq: Two times a day (BID) | INTRAMUSCULAR | Status: DC
  Administered 2017-07-21 – 2017-07-22 (×3): 20 mg via INTRAVENOUS
  Filled 2017-07-21 (×3): qty 1

## 2017-07-21 MED ORDER — SODIUM CHLORIDE 1 G PO TABS
1.0000 g | ORAL_TABLET | Freq: Three times a day (TID) | ORAL | Status: DC
  Administered 2017-07-21 – 2017-07-26 (×16): 1 g via ORAL
  Filled 2017-07-21 (×17): qty 1

## 2017-07-21 MED ORDER — IPRATROPIUM-ALBUTEROL 0.5-2.5 (3) MG/3ML IN SOLN
3.0000 mL | RESPIRATORY_TRACT | Status: DC
  Administered 2017-07-21 – 2017-07-23 (×12): 3 mL via RESPIRATORY_TRACT
  Filled 2017-07-21 (×12): qty 3

## 2017-07-21 NOTE — Care Management (Signed)
Presented with progressive weakness with some shortness of breath.  Has had some falls. Being treated for pneumonia but there is concern that pulmonary findings could be a mass.  Up until 2 - 4 weeks ago, patient was independent in ambulation without assistive device.  Admitted with critical sodium level.  PT consult is pending.  Palliative is consulting.  Patient is DNR .  Not requiring oxygen.

## 2017-07-21 NOTE — Consult Note (Signed)
Consultation Note Date: 07/21/2017   Patient Name: Jeffrey Haynes  DOB: [birth year]  MRN: [id number]  Age / Sex: 83 y.o., male  PCP: [name], MD Referring Physician: [name], MD  Reason for Consultation: Establishing goals of care  HPI/Patient Profile: 83 y.o. male  with past medical history of COPD, CAD, hypothyroidism, hyperlipidemia, orthostatic hypotension, paroxysmal atrial fibrillation, hip fracture s/p arthroplasty, and falls admitted on 07/20/2017 with fatigue and shortness of breath. In ED, patient with severe hyponatremia (114) and hyperkalemia. CT chest reveals large lung mass versus inflammation/infection. IVF and IV antibiotics initiated. PCCM consulted. Recommending treatment for community-acquired pneumonia. Now receiving steroids and bronchodilator therapy. Repeat CT scan in 6-8 weeks. Palliative medicine consultation for goals of care.   Clinical Assessment and Goals of Care: I have reviewed medical records, discussed with care team, and met with patient, son Joylene John) and daughter Manuela Schwartz) at bedside  to discuss diagnosis, [place], EOL wishes, disposition and options.  Introduced Palliative Medicine as specialized medical care for people living with serious illness. It focuses on providing relief from the symptoms and stress of a serious illness. The goal is to improve quality of life for both the patient and the family.  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 Wife died 5 years ago. 3 children and multiple grand and great-grandchildren. He loves to golf (has not since hip replacement) and still volunteering for [employer]. Lives with son, Christia Reading. The patient has been "90% independent" up until three weeks ago when he became weak and with falls, requiring cane/walker. Rock tells me his appetite is fair--he doesn't like  wearing his teeth. Family denies weight loss.   Discussed hospital diagnoses and interventions. [place] have a good understanding of severe hyponatremia and possible lung mass. They understand he is being treated for pneumonia and plan to follow-up with pulmonology outpatient for repeat CT.   Advanced directives, concepts specific to code status, and artifical feeding and hydration were discussed. Patient has a documented living will/HCPOA. Reviewed in [place]. Rock Linden), Manuela Schwartz, and Christia Reading are all documented HCPOA. Family confirm DNR code status.   Patient falls asleep during our conversation. Rock and Manuela Schwartz feel he has greatly improved since yesterday with IVF/antibiotics and with good appetite since he has been hospitalized. They are hopeful for continued improvement and physical therapy evaluation soon. Manuela Schwartz states "we are realistic that he's not going to live forever though" and "blessed we have had him this long." If the mass does not clear after antibiotics, they do not believe he will pursue aggressive workup for lung mass.   Daughter and son share stories of their father. Therapeutic listening and emotional/spiritual support provided.    SUMMARY OF RECOMMENDATIONS    Family confirms DNR.   Continue current interventions. Hopeful for improvement and plan to f/u outpatient with pulmonology.   PT evaluation pending.  May benefit from palliative services outpatient on discharge.   Code Status/Advance Care Planning:  DNR  Symptom Management:   Per attending  Palliative Prophylaxis:   Aspiration, Delirium Protocol, Frequent Pain Assessment, Oral Care and Turn Reposition  Additional Recommendations (Limitations, Scope, Preferences):  Full Scope Treatment-except DNR  Psycho-social/Spiritual:   Desire for further Chaplaincy support:no  Additional Recommendations: Caregiving  Support/Resources  Prognosis:   Unable to determine  Discharge Planning: To Be  Determined  Will likely need SNF for rehab     Primary Diagnoses: Present on Admission: . Hyponatremia  I have reviewed the medical record, interviewed the patient and family, and examined the patient. The following aspects are pertinent.  Past Medical History:  Diagnosis Date  . Bleeding ulcer 09/2012   a. gastric  . CAD (coronary artery disease)    a. Reported hx.  [name] COPD (chronic obstructive pulmonary disease) ([place])   . Essential hypertension   . Familial tremor   . Hyperlipidemia   . Hypothyroidism   . Orthostatic hypotension    a. leading to prior syncope  . PAF (paroxysmal atrial fibrillation) (HCC)    a. in the post-op setting and associated with COPD exacerbation-->amio 200 daily & eliquis 5 bid.   Social History   Social History  . Marital status: Widowed    Spouse name: N/A  . Number of children: N/A  . Years of education: N/A   Social History Main Topics  . Smoking status: Former Smoker    Types: Cigarettes    Quit date: 05/02/1978  . Smokeless tobacco: Former Systems developer    Types: [place] date: 10/02/2012  . Alcohol use No  . Drug use: No  . Sexual activity: Not Asked   Other Topics Concern  . None   Social History Narrative  . None   Family History  Problem Relation Age of Onset  . Family history unknown: Yes   Scheduled Meds: . atorvastatin  20 mg Oral QHS  . docusate sodium  100 mg Oral BID  . heparin  5,000 Units Subcutaneous Q8H  . ipratropium-albuterol  3 mL Inhalation Q4H  . levothyroxine  75 mcg Oral QAC breakfast  . methylPREDNISolone (SOLU-MEDROL) injection  20 mg Intravenous Q12H  . metoprolol succinate  25 mg Oral Daily  . pantoprazole  40 mg Oral Daily  . sodium chloride  1 g Oral TID WC   Continuous Infusions: . sodium chloride 75 mL/hr at 07/21/17 0854  . azithromycin 500 mg (07/21/17 1000)  . cefTRIAXone (ROCEPHIN) 1 g IVPB     PRN Meds:.acetaminophen **OR** acetaminophen, bisacodyl, ondansetron **OR** ondansetron  (ZOFRAN) IV, traZODone Medications Prior to Admission:  Prior to Admission medications   Medication Sig Start Date End Date Taking? Authorizing Provider  acetaminophen (TYLENOL) 500 MG tablet Take 500 mg by mouth every 6 (six) hours as needed.   Yes [provider]  albuterol (PROVENTIL HFA;VENTOLIN HFA) 108 (90 Base) MCG/ACT inhaler Inhale 2 puffs into the lungs every 6 (six) hours as needed for wheezing or shortness of breath. 10/29/16  Yes Crissman, [name], MD  atorvastatin (LIPITOR) 20 MG tablet Take 1 tablet (20 mg total) by mouth at bedtime. 10/29/16  Yes Crissman, [name], MD  cetirizine (ZYRTEC) 10 MG tablet Take 10 mg by mouth daily.   Yes [provider]  Ipratropium-Albuterol (COMBIVENT) 20-100 MCG/ACT AERS respimat Inhale 2 puffs into the lungs 2 (two) times daily. 11/06/15  Yes Crissman, [name], MD  levothyroxine (SYNTHROID, LEVOTHROID) 75 MCG tablet Take 1 tablet (75 mcg total) by mouth daily before breakfast.  10/29/16  Yes Crissman, [name], MD  metoprolol succinate (TOPROL-XL) 25 MG 24 hr tablet Take 1 tablet (25 mg total) by mouth daily. 10/29/16  Yes Crissman, [name], MD  pantoprazole (PROTONIX) 40 MG tablet Take 1 tablet (40 mg total) by mouth daily. 01/06/17  Yes Crissman, [name], MD  potassium chloride (K-DUR) 10 MEQ tablet Take 1 tablet (10 mEq total) by mouth daily. 03/23/17  Yes Minna Merritts, MD   No Known Allergies Review of Systems  Constitutional: Positive for activity change and fatigue.  Respiratory: Positive for shortness of breath.   Neurological: Positive for weakness.   Physical Exam  Constitutional: He is oriented to person, place, and time. He is cooperative.  HENT:  Head: Normocephalic and atraumatic.  Cardiovascular: Regular rhythm.   Pulmonary/Chest: Effort normal. No accessory muscle usage. No tachypnea. No respiratory distress.  Abdominal: Normal appearance.  Neurological: He is alert and oriented to person, place, and time.    Skin: Skin is warm and dry.  Psychiatric: He has a normal mood and affect. His behavior is normal. His speech is delayed.  Nursing note and vitals reviewed.  Vital Signs: BP 124/60   Pulse 68   Temp 98 F (36.7 C) (Oral)   Resp 18   Ht 6' (1.829 m)   Wt 87.6 kg (193 lb 1.6 oz)   SpO2 98%   BMI 26.19 kg/m  Pain Assessment: No/denies pain     SpO2: SpO2: 98 % O2 Device:SpO2: 98 % O2 Flow Rate: .   IO: Intake/output summary:   Intake/Output Summary (Last 24 hours) at 07/21/17 0943 Last data filed at 07/21/17 0500  Gross per 24 hour  Intake                0 ml  Output                0 ml  Net                0 ml    LBM: Last BM Date: 07/20/17 Baseline Weight: Weight: 86.2 kg (190 lb) Most recent weight: Weight: 87.6 kg (193 lb 1.6 oz)     Palliative Assessment/Data: PPS 40%   Flowsheet Rows     Most Recent Value  Intake Tab  Referral Department  Hospitalist  Unit at Time of Referral  Cardiac/Telemetry Unit  Palliative Care Primary Diagnosis  Sepsis/Infectious Disease  Palliative Care Type  New Palliative care  Reason for referral  Clarify Goals of Care  Date first seen by Palliative Care  07/21/17  Clinical Assessment  Palliative Performance Scale Score  40%  Psychosocial & Spiritual Assessment  Palliative Care Outcomes  Patient/Family meeting held?  Yes  Who was at the meeting?  patient, daughter, son  Palliative Care Outcomes  Clarified goals of care, Provided psychosocial or spiritual support, ACP counseling assistance      Time In: 37 Time Out: 0950 Time Total: 24mn Greater than 50%  of this time was spent counseling and coordinating care related to the above assessment and plan.  Signed by:  MIhor Dow FNP-C Palliative Medicine Team  Phone: [phone]: [phone]  Please contact Palliative Medicine Team phone at [phone] questions and concerns.  For individual provider: See AShea Evans

## 2017-07-21 NOTE — Telephone Encounter (Signed)
-----   Message from [facility], [place] sent at 12/26/4172  8:51 AM EDT ----- Regarding: FW: hosp follow up in 8 weeks Please schedule hosp f/u in 7-8 weeks with any provider for pneumonia.  Thanks, Misty ----- Message ----- From: Flora Lipps, MD Sent: 07/21/2017   8:46 AM To: Renelda Mom, LPN Subject: hosp follow up in 8 weeks                      Thank you

## 2017-07-21 NOTE — Progress Notes (Signed)
Jeffrey Haynes was called to MD Bridgett Larsson. Pt has no further concerns at this time.

## 2017-07-21 NOTE — Progress Notes (Addendum)
[facility] NAME: Fines Kimberlin    MR#:  [id number]  DATE OF BIRTH:  1925-04-20  SUBJECTIVE:  CHIEF COMPLAINT:   Chief Complaint  Patient presents with  . Fatigue  . Shortness of Breath   Generalized weakness, better cough and shortness of breath, off oxygen REVIEW OF SYSTEMS:  Review of Systems  Constitutional: Positive for malaise/fatigue. Negative for chills and fever.  HENT: Positive for hearing loss. Negative for sore throat.   Eyes: Negative for blurred vision and double vision.  Respiratory: Positive for cough and shortness of breath. Negative for hemoptysis, wheezing and stridor.   Cardiovascular: Negative for chest pain, palpitations, orthopnea and leg swelling.  Gastrointestinal: Negative for abdominal pain, blood in stool, diarrhea, melena, nausea and vomiting.  Genitourinary: Negative for dysuria, flank pain and hematuria.  Musculoskeletal: Negative for back pain and joint pain.  Skin: Negative for rash.  Neurological: Positive for weakness. Negative for dizziness, sensory change, focal weakness, seizures, loss of consciousness and headaches.  Endo/Heme/Allergies: Negative for polydipsia.  Psychiatric/Behavioral: Negative for depression. The patient is not nervous/anxious.     DRUG ALLERGIES:  No Known Allergies VITALS:  Blood pressure (!) 134/59, pulse 65, temperature 97.8 F (36.6 C), temperature source Oral, resp. rate 19, height 6' (1.829 m), weight 193 lb 1.6 oz (87.6 kg), SpO2 98 %. PHYSICAL EXAMINATION:  Physical Exam  Constitutional: He is oriented to person, place, and time and well-developed, well-nourished, and in no distress.  HENT:  Head: Normocephalic.  Mouth/Throat: Oropharynx is clear and moist.  Eyes: Pupils are equal, round, and reactive to light. Conjunctivae and EOM are normal. No scleral icterus.  Neck: Normal range of motion. Neck supple. No JVD present. No tracheal deviation present.    Cardiovascular: Normal rate, regular rhythm and normal heart sounds.  Exam reveals no gallop.   No murmur heard. Pulmonary/Chest: Effort normal. No respiratory distress. He has no wheezes. He has no rales.  Right-sided crackles  Abdominal: Soft. Bowel sounds are normal. He exhibits no distension. There is no tenderness. There is no rebound.  Musculoskeletal: Normal range of motion. He exhibits no edema or tenderness.  Neurological: He is alert and oriented to person, place, and time. No cranial nerve deficit.  Skin: No rash noted. No erythema.  Psychiatric: Affect normal.   LABORATORY PANEL:  Male CBC  Recent Labs Lab 07/21/17 0533  WBC 6.6  HGB 13.2  HCT 38.4*  PLT 189   ------------------------------------------------------------------------------------------------------------------ Chemistries   Recent Labs Lab 07/20/17 1134  07/21/17 0533  NA 114*  --  115*  K 5.2*  < > 4.0  CL 85*  --  87*  CO2 22  --  22  GLUCOSE 86  --  87  BUN 11  --  8  CREATININE 0.80  --  0.61  CALCIUM 8.4*  --  7.5*  AST 65*  --   --   ALT 67*  --   --   ALKPHOS 201*  --   --   BILITOT 1.8*  --   --   < > = values in this interval not displayed. RADIOLOGY:  No results found. ASSESSMENT AND PLAN:   83 year old male ith a known history of COPD, hypertension, hyperlipidemia, paroxysmal atrial fibrillation being admitted For severe hyponatremia, hyperkalemia, and new lung mass  * Severe hyponatremia, Na 115. - can be due to SIADH from underlying lung cancer versus poor by mouth intake Continue normal saline IV,  give salt tablets, follow-up BMP. Nephrology consult.  * Hyperkalemia: no EKG changes Given  one dose of Kayexalate and improved.  Acute respiratory failure due to pneumonia and COPD exacerbation Continue IV Solu-Medrol, DuoNeb when necessary. Off oxygen.  * New lung mass Continue antibiotics and repeat imaging in 6-8 weeks per Dr. Mortimer Fries.  * Community-acquired  pneumonia - on IV Rocephin and Zithromax  Essential Hypertension. Continue Lopressor.  Generalized weakness. PT evaluation tomorrow. All the records are reviewed and case discussed with Care Management/Social Worker. Management plans discussed with the patient,His daughter and the son and they are in agreement.  CODE STATUS: DNR  TOTAL TIME TAKING CARE OF THIS PATIENT: 38 minutes.   More than 50% of the time was spent in counseling/coordination of care: YES  POSSIBLE D/C IN 3 DAYS, DEPENDING ON CLINICAL CONDITION.   [name] M.D on 07/21/2017 at 2:29 PM  Between 7am to 6pm - Pager - [phone]  After 6pm go to [email or web address] - Proofreader  [facility]  Office  [phone]  CC: Primary care physician; [name], MD  Note: This dictation was prepared with Dragon dictation along with smaller phrase technology. Any transcriptional errors that result from this process are unintentional.

## 2017-07-21 NOTE — Telephone Encounter (Signed)
LMOM to schedule f/u

## 2017-07-22 LAB — BASIC METABOLIC PANEL
Anion gap: 9 (ref 5–15)
BUN: 8 mg/dL (ref 6–20)
CALCIUM: 7.8 mg/dL — AB (ref 8.9–10.3)
CO2: 18 mmol/L — ABNORMAL LOW (ref 22–32)
CREATININE: 0.7 mg/dL (ref 0.61–1.24)
Chloride: 87 mmol/L — ABNORMAL LOW (ref 101–111)
GFR calc Af Amer: >60 mL/min (ref >60)
GLUCOSE: 164 mg/dL — AB (ref 65–99)
Potassium: 4 mmol/L (ref 3.5–5.1)
Sodium: 114 mmol/L — CL (ref 135–145)

## 2017-07-22 LAB — SODIUM
SODIUM: 115 mmol/L — AB (ref 135–145)
SODIUM: 115 mmol/L — AB (ref 135–145)
Sodium: 115 mmol/L — CL (ref 135–145)

## 2017-07-22 LAB — GLUCOSE, CAPILLARY
GLUCOSE-CAPILLARY: 150 mg/dL — AB (ref 65–99)
GLUCOSE-CAPILLARY: 196 mg/dL — AB (ref 65–99)
GLUCOSE-CAPILLARY: 193 mg/dL — AB (ref 65–99)

## 2017-07-22 LAB — SODIUM, URINE, RANDOM: Sodium, Ur: 42 mmol/L

## 2017-07-22 LAB — OSMOLALITY, URINE: OSMOLALITY UR: 816 mosm/kg (ref 300–900)

## 2017-07-22 LAB — OSMOLALITY: OSMOLALITY: 245 mosm/kg — AB (ref 275–295)

## 2017-07-22 MED ORDER — AZITHROMYCIN 500 MG PO TABS
250.0000 mg | ORAL_TABLET | Freq: Every day | ORAL | Status: DC
  Administered 2017-07-23 – 2017-07-24 (×2): 250 mg via ORAL
  Filled 2017-07-22 (×2): qty 1

## 2017-07-22 MED ORDER — ENOXAPARIN SODIUM 40 MG/0.4ML ~~LOC~~ SOLN
40.0000 mg | SUBCUTANEOUS | Status: DC
  Administered 2017-07-22 – 2017-07-25 (×4): 40 mg via SUBCUTANEOUS
  Filled 2017-07-22 (×4): qty 0.4

## 2017-07-22 NOTE — Progress Notes (Signed)
[facility] NAME: Jeffrey Haynes    MR#:  [id number]  DATE OF BIRTH:  1925-10-29  SUBJECTIVE:  CHIEF COMPLAINT:   Chief Complaint  Patient presents with  . Fatigue  . Shortness of Breath   Generalized weakness, no cough and shortness of breath, off oxygen.  Confused last night and this morning per his daughter. REVIEW OF SYSTEMS:  Review of Systems  Constitutional: Positive for malaise/fatigue. Negative for chills and fever.  HENT: Positive for hearing loss. Negative for sore throat.   Eyes: Negative for blurred vision and double vision.  Respiratory: Negative for cough, hemoptysis, shortness of breath, wheezing and stridor.   Cardiovascular: Negative for chest pain, palpitations, orthopnea and leg swelling.  Gastrointestinal: Negative for abdominal pain, blood in stool, diarrhea, melena, nausea and vomiting.  Genitourinary: Negative for dysuria, flank pain and hematuria.  Musculoskeletal: Negative for back pain and joint pain.  Skin: Negative for rash.  Neurological: Positive for weakness. Negative for dizziness, sensory change, focal weakness, seizures, loss of consciousness and headaches.  Endo/Heme/Allergies: Negative for polydipsia.  Psychiatric/Behavioral: Negative for depression. The patient is not nervous/anxious.     DRUG ALLERGIES:  No Known Allergies VITALS:  Blood pressure 137/63, pulse 90, temperature 97.7 F (36.5 C), temperature source Oral, resp. rate 20, height 6' (1.829 m), weight 193 lb 1.6 oz (87.6 kg), SpO2 100 %. PHYSICAL EXAMINATION:  Physical Exam  Constitutional: He is oriented to person, place, and time and well-developed, well-nourished, and in no distress.  HENT:  Head: Normocephalic.  Mouth/Throat: Oropharynx is clear and moist.  Eyes: Pupils are equal, round, and reactive to light. Conjunctivae and EOM are normal. No scleral icterus.  Neck: Normal range of motion. Neck supple. No JVD present. No  tracheal deviation present.  Cardiovascular: Normal rate, regular rhythm and normal heart sounds.  Exam reveals no gallop.   No murmur heard. Pulmonary/Chest: Effort normal. No respiratory distress. He has no wheezes. He has no rales.  Right-sided crackles  Abdominal: Soft. Bowel sounds are normal. He exhibits no distension. There is no tenderness. There is no rebound.  Musculoskeletal: Normal range of motion. He exhibits no edema or tenderness.  Neurological: He is alert and oriented to person, place, and time. No cranial nerve deficit.  Skin: No rash noted. No erythema.  Psychiatric: Affect normal.   LABORATORY PANEL:  Male CBC  Recent Labs Lab 07/21/17 0533  WBC 6.6  HGB 13.2  HCT 38.4*  PLT 189   ------------------------------------------------------------------------------------------------------------------ Chemistries   Recent Labs Lab 07/20/17 1134  07/22/17 0337 07/22/17 0948  NA 114*  < > 114* 115*  K 5.2*  < > 4.0  --   CL 85*  < > 87*  --   CO2 22  < > 18*  --   GLUCOSE 86  < > 164*  --   BUN 11  < > 8  --   CREATININE 0.80  < > 0.70  --   CALCIUM 8.4*  < > 7.8*  --   AST 65*  --   --   --   ALT 67*  --   --   --   ALKPHOS 201*  --   --   --   BILITOT 1.8*  --   --   --   < > = values in this interval not displayed. RADIOLOGY:  No results found. ASSESSMENT AND PLAN:   83 year old male ith a known history of  COPD, hypertension, hyperlipidemia, paroxysmal atrial fibrillation being admitted For severe hyponatremia, hyperkalemia, and new lung mass  * Severe hyponatremia, Na is still low at 115. - can be due to SIADH from underlying lung cancer versus poor by mouth intake fluid restriction 1291mL, Discontinue normal saline IV, give salt tablets q6h, follow-up BMP q6h per Dr. Juleen China.  * Hyperkalemia: no EKG changes Given  one dose of Kayexalate and improved.  Acute respiratory failure due to pneumonia and COPD exacerbation Discontinue IV  Solu-Medrol, DuoNeb when necessary. Off oxygen.  * New lung mass Continue antibiotics and repeat imaging in 6-8 weeks per Dr. Mortimer Fries.  * Community-acquired pneumonia - on IV Rocephin and Zithromax, change to po.  Essential Hypertension. Continue Lopressor.  Generalized weakness. PT evaluation tomorrow. All the records are reviewed and case discussed with Care Management/Social Worker. Management plans discussed with the patient,His daughter and the son and they are in agreement.  CODE STATUS: DNR  TOTAL TIME TAKING CARE OF THIS PATIENT: 38 minutes.   More than 50% of the time was spent in counseling/coordination of care: YES  POSSIBLE D/C IN 3 DAYS, DEPENDING ON CLINICAL CONDITION.   Jeffrey Haynes M.D on 07/22/2017 at 2:12 PM  Between 7am to 6pm - Pager - [phone]  After 6pm go to [email or web address] - Proofreader  [facility]  Office  [phone]  CC: Primary care physician; Jeffrey Maple, MD  Note: This dictation was prepared with Dragon dictation along with smaller phrase technology. Any transcriptional errors that result from this process are unintentional.

## 2017-07-22 NOTE — Progress Notes (Signed)
PT Cancellation Note  Patient Details Name: Jeffrey Haynes MRN: [id number] DOB: 1925-11-26   Cancelled Treatment:    Reason Eval/Treat Not Completed: Other (comment).  PT consult received.  Chart reviewed.  Pt sitting up in bed eating upon PT entry.  Pt's family present and reporting pt had just started eating lunch.  Will re-attempt PT eval at a later date/time.  Leitha Bleak, PT 07/22/17, 12:08 PM [phone]

## 2017-07-22 NOTE — Evaluation (Signed)
Physical Therapy Evaluation Patient Details Name: Jeffrey Haynes MRN: [id number] DOB: Jun 24, 1925 Today's Date: 07/22/2017   History of Present Illness  Pt is a 83 y.o. male presenting to hospital with fatigue, SOB, and recent slurred speech.  Pt s/p fall July 25th with decreased function since then and another fall 07/19/17.  Pt admitted to hospital with severe hyponatremia, hyperkalemia, new lung mass, and community acquired PNA.  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  Clinical Impression  Prior to hospital admission, pt was independent with functional mobility (occasionally using SPC) but has had increased difficulty with mobility recently.  Pt lives with his son in 1 level home with stairs to enter.  Currently pt is mod to max assist supine to sit, mod assist to stand with RW, and min to mod assist x2 to take a few steps bed to recliner with RW.  Pt demonstrating posterior lean initially upon standing and also turning towards chair requiring assist to steady/regain balance.  SOB noted with activity requiring pacing/rest breaks (O2 97% or greater on room air and HR 81-89 bpm during session).  Occasional confusion noted during session (confirmed by pt's daughter).  Pt would benefit from skilled PT to address noted impairments and functional limitations (see below for any additional details).  Upon hospital discharge, recommend pt discharge to [facility].    Follow Up Recommendations SNF    Equipment Recommendations  Rolling walker with 5" wheels    Recommendations for Other Services       Precautions / Restrictions Precautions Precautions: Fall Restrictions Weight Bearing Restrictions: No      Mobility  Bed Mobility Overal bed mobility: Needs Assistance Bed Mobility: Supine to Sit     Supine to sit: Mod assist;Max assist     General bed mobility comments: bed flat; assist for trunk and B LE's supine to sit; mod assist  to scoot to edge of bed using bed sheet  Transfers Overall transfer level: Needs assistance Equipment used: Rolling walker (2 wheeled) Transfers: Sit to/from Stand Sit to Stand: Mod assist         General transfer comment: pt initially with posterior lean standing requiring mod assist to shift weight forward (pt's LE's pushing against bed) and vc's to push UE's through RW (pt pulling on RW with front wheels off ground); 2nd attempt standing pt still with mild posterior lean but improved overall compared to first attempt; vc's for hand and feet placement  Ambulation/Gait Ambulation/Gait assistance: Min assist;Mod assist;+2 physical assistance Ambulation Distance (Feet): 3 Feet (bed to chair) Assistive device: Rolling walker (2 wheeled)   Gait velocity: decreased   General Gait Details: decreased B step length/foot clearance/heelstrike; posterior lean after turning towards chair requiring assist for balance/to shift weight forward; vc's for technique  Stairs Stairs:  (not appropriate at this time)          Wheelchair Mobility    Modified Rankin (Stroke Patients Only)       Balance Overall balance assessment: Needs assistance;History of Falls Sitting-balance support: Bilateral upper extremity supported;Feet supported Sitting balance-Leahy Scale: Poor Sitting balance - Comments: pt initially requiring max assist d/t significant posterior lean sitting edge of bed but improved to close SBA with positioning assist/cues Postural control: Posterior lean Standing balance support: Bilateral upper extremity supported Standing balance-Leahy Scale: Poor Standing balance comment: pt with posterior lean static standing with use of RW requiring assist (see above mobility for details)  Pertinent Vitals/Pain Pain Assessment: No/denies pain  Vitals (HR and O2 on room air) stable and WFL throughout treatment session.    Home Living Family/patient  expects to be discharged to:: Private residence Living Arrangements: Children ([name]) Available Help at Discharge: Family Type of Home: House Home Access: Stairs to enter Entrance Stairs-Rails: Left Entrance Stairs-Number of Steps: 5 Home Layout: One level Home Equipment: [place] - 2 wheels;Cane - single point;Shower seat;Grab bars - tub/shower;Grab bars - toilet      Prior Function Level of Independence: Independent with assistive device(s)         Comments: Pt active mowing lawn, doing yard work this summer.  Occasionally uses SPC.     Hand Dominance        Extremity/Trunk Assessment   Upper Extremity Assessment Upper Extremity Assessment: Generalized weakness    Lower Extremity Assessment Lower Extremity Assessment: Generalized weakness    Cervical / Trunk Assessment Cervical / Trunk Assessment: Normal  Communication   Communication: HOH (Wears hearing aide R ear; difficulty hearing L ear (no hearing aide))  Cognition Arousal/Alertness: Awake/alert Behavior During Therapy: WFL for tasks assessed/performed Overall Cognitive Status: Impaired/Different from baseline (Oriented to person, place, situation, month, day of week, and year (not day))                                 General Comments: Pt demonstrating some generalized confusion during session (pt's daughter present and confirming confusion and reports pt normally very "sharp" in terms of cognition)      General Comments General comments (skin integrity, edema, etc.): Pt's daughter present during the session.  Nursing cleared pt for participation in physical therapy.  Pt agreeable to PT session.    Exercises  Transfer training   Assessment/Plan    PT Assessment Patient needs continued PT services  PT Problem List Decreased strength;Decreased activity tolerance;Decreased balance;Decreased mobility;Decreased cognition;Decreased knowledge of use of DME;Decreased safety awareness;Decreased  knowledge of precautions       PT Treatment Interventions DME instruction;Gait training;Stair training;Functional mobility training;Therapeutic activities;Therapeutic exercise;Balance training;Patient/family education    PT Goals (Current goals can be found in the Care Plan section)  Acute Rehab PT Goals Patient Stated Goal: to get stronger PT Goal Formulation: With patient/family Time For Goal Achievement: 08/05/17 Potential to Achieve Goals: Good    Frequency Min 2X/week   Barriers to discharge Decreased caregiver support      Co-evaluation               AM-PAC PT "6 Clicks" Daily Activity  Outcome Measure Difficulty turning over in bed (including adjusting bedclothes, sheets and blankets)?: Total Difficulty moving from lying on back to sitting on the side of the bed? : Total Difficulty sitting down on and standing up from a chair with arms (e.g., wheelchair, bedside commode, etc,.)?: Total Help needed moving to and from a bed to chair (including a wheelchair)?: Total Help needed walking in hospital room?: Total Help needed climbing 3-5 steps with a railing? : Total 6 Click Score: 6    End of Session Equipment Utilized During Treatment: Gait belt Activity Tolerance: Patient limited by fatigue Patient left: in chair;with call bell/phone within reach;with chair alarm set;with family/visitor present Nurse Communication: Mobility status;Precautions PT Visit Diagnosis: Other abnormalities of gait and mobility (R26.89);Muscle weakness (generalized) (M62.81);History of falling (Z91.81)    Time: 1343-1420 PT Time Calculation (min) (ACUTE ONLY): 37 min   Charges:   PT  Evaluation $PT Eval Low Complexity: 1 Low PT Treatments $Therapeutic Activity: 8-22 mins   PT G Codes:   PT G-Codes **NOT FOR INPATIENT CLASS** Functional Assessment Tool Used: AM-PAC 6 Clicks Basic Mobility Functional Limitation: Mobility: Walking and moving around Mobility: Walking and Moving Around  Current Status (G4720): 100 percent impaired, limited or restricted Mobility: Walking and Moving Around Goal Status (T2182): At least 1 percent but less than 20 percent impaired, limited or restricted    Leitha Bleak, PT 07/22/17, 4:04 PM [phone]

## 2017-07-22 NOTE — Plan of Care (Signed)
Problem: Safety: Goal: Ability to remain free from injury will improve Outcome: Progressing Bed alarm enable at all times

## 2017-07-22 NOTE — Progress Notes (Signed)
PHARMACIST - PHYSICIAN COMMUNICATION DR:   Manuella Ghazi CONCERNING: Antibiotic IV to Oral Route Change Policy  RECOMMENDATION: This patient is receiving Azithromycin by the intravenous route.  Based on criteria approved by the Pharmacy and Therapeutics Committee, the antibiotic(s) is/are being converted to the equivalent oral dose form(s).   DESCRIPTION: These criteria include:  Patient being treated for a respiratory tract infection, urinary tract infection, cellulitis or clostridium difficile associated diarrhea if on metronidazole  The patient is not neutropenic and does not exhibit a GI malabsorption state  The patient is eating (either orally or via tube) and/or has been taking other orally administered medications for a least 24 hours  The patient is improving clinically and has a Tmax < 100.5  If you have questions about this conversion, please contact the Pharmacy Department  []   [phone] )  Jeffrey Haynes [x]   [phone] )  [facility] []   [phone] )  Jeffrey Haynes []   [phone] )  [facility] []   [phone] )  Jeffrey Haynes   [place], PharmD

## 2017-07-22 NOTE — Consult Note (Signed)
[facility]  CONSULT NOTE    Date: 07/22/2017                  Patient Name:  Jeffrey Haynes  MRN: [id number]  DOB: Jul 19, 1925  Age / Sex: 84 y.o., male         PCP: [name], MD                 Service Requesting Consult: Dr. Bridgett Larsson                 Reason for Consult: Hyponatremia            History of Present Illness: Jeffrey Haynes is a 83 y.o. white male with COPD, hyperlipidemia, hypothyroidism, GERD/peptic ulcer disease, allergic rhinitis, atrial fibrillation, tremor, coronary artery diseasewho was admitted to [facility] on 07/20/2017 for Hyponatremia [E87.1] Weakness [R53.1] Community acquired pneumonia of left lung, unspecified part of lung [J18.9]   Patient admitted with weakness and shortness of breath. Found to have a lung nodule and there is concern for malignancy.   Serum sodium of 114. No improvement with IV fluids, NS.   Daughter and Son at bedside.   Patient with some altered mental status last night and some confusion still this morning.    Medications: Outpatient medications: Prescriptions Prior to Admission  Medication Sig Dispense Refill Last Dose  . acetaminophen (TYLENOL) 500 MG tablet Take 500 mg by mouth every 6 (six) hours as needed.   prn  . albuterol (PROVENTIL HFA;VENTOLIN HFA) 108 (90 Base) MCG/ACT inhaler Inhale 2 puffs into the lungs every 6 (six) hours as needed for wheezing or shortness of breath. 1 Inhaler 2 prn  . atorvastatin (LIPITOR) 20 MG tablet Take 1 tablet (20 mg total) by mouth at bedtime. 90 tablet 4 07/19/2017 at Unknown time  . cetirizine (ZYRTEC) 10 MG tablet Take 10 mg by mouth daily.   07/19/2017 at Unknown time  . Ipratropium-Albuterol (COMBIVENT) 20-100 MCG/ACT AERS respimat Inhale 2 puffs into the lungs 2 (two) times daily. 3 Inhaler 4 07/19/2017 at Unknown time  . levothyroxine (SYNTHROID, LEVOTHROID) 75 MCG tablet Take 1 tablet (75 mcg total) by mouth daily before breakfast. 90 tablet 4 07/19/2017 at  Unknown time  . metoprolol succinate (TOPROL-XL) 25 MG 24 hr tablet Take 1 tablet (25 mg total) by mouth daily. 90 tablet 4 07/19/2017 at Unknown time  . pantoprazole (PROTONIX) 40 MG tablet Take 1 tablet (40 mg total) by mouth daily. 30 tablet 12 07/19/2017 at Unknown time  . potassium chloride (K-DUR) 10 MEQ tablet Take 1 tablet (10 mEq total) by mouth daily. 30 tablet 6 07/19/2017 at Unknown time    Current medications: Current Facility-Administered Medications  Medication Dose Route Frequency Provider Last Rate Last Dose  . acetaminophen (TYLENOL) tablet 650 mg  650 mg Oral Q6H PRN Max Sane, MD       Or  . acetaminophen (TYLENOL) suppository 650 mg  650 mg Rectal Q6H PRN Max Sane, MD      . atorvastatin (LIPITOR) tablet 20 mg  20 mg Oral QHS Max Sane, MD   20 mg at 07/21/17 2131  . [START ON 07/23/2017] azithromycin (ZITHROMAX) tablet 250 mg  250 mg Oral Daily [place], [place], [facility]      . bisacodyl (DULCOLAX) EC tablet 5 mg  5 mg Oral Daily PRN Max Sane, MD      . cefTRIAXone (ROCEPHIN) 1 g in dextrose 5 % 50 mL IVPB  1 g Intravenous Q24H Max Sane, MD   Stopped at 07/21/17 1136  . docusate sodium (COLACE) capsule 100 mg  100 mg Oral BID Max Sane, MD   100 mg at 07/22/17 0831  . heparin injection 5,000 Units  5,000 Units Subcutaneous Q8H Max Sane, MD   5,000 Units at 07/22/17 0522  . ipratropium-albuterol (DUONEB) 0.5-2.5 (3) MG/3ML nebulizer solution 3 mL  3 mL Inhalation Q4H Flora Lipps, MD   3 mL at 07/22/17 0754  . levothyroxine (SYNTHROID, LEVOTHROID) tablet 75 mcg  75 mcg Oral QAC breakfast Max Sane, MD   75 mcg at 07/22/17 4010  . methylPREDNISolone sodium succinate (SOLU-MEDROL) 40 mg/mL injection 20 mg  20 mg Intravenous Q12H Flora Lipps, MD   20 mg at 07/22/17 0833  . metoprolol succinate (TOPROL-XL) 24 hr tablet 25 mg  25 mg Oral Daily Max Sane, MD   25 mg at 07/22/17 0832  . ondansetron (ZOFRAN) tablet 4 mg  4 mg Oral Q6H PRN Max Sane, MD       Or  .  ondansetron (ZOFRAN) injection 4 mg  4 mg Intravenous Q6H PRN Max Sane, MD      . pantoprazole (PROTONIX) EC tablet 40 mg  40 mg Oral Daily Max Sane, MD   40 mg at 07/22/17 0832  . sodium chloride tablet 1 g  1 g Oral TID WC [name], MD   1 g at 07/22/17 [phone]  . traZODone (DESYREL) tablet 25 mg  25 mg Oral QHS PRN Max Sane, MD          Allergies: No Known Allergies    Past Medical History: Past Medical History:  Diagnosis Date  . Bleeding ulcer 09/2012   a. gastric  . CAD (coronary artery disease)    a. Reported hx.  [name] COPD (chronic obstructive pulmonary disease) ([place])   . Essential hypertension   . Familial tremor   . Hyperlipidemia   . Hypothyroidism   . Orthostatic hypotension    a. leading to prior syncope  . PAF (paroxysmal atrial fibrillation) ([facility])    a. in the post-op setting and associated with COPD exacerbation-->amio 200 daily & eliquis 5 bid.     Past Surgical History: Past Surgical History:  Procedure Laterality Date  . HERNIA REPAIR    . HIP ARTHROPLASTY Left 05/02/2016   Procedure: ARTHROPLASTY BIPOLAR HIP (HEMIARTHROPLASTY);  Surgeon: [name], MD;  Location: [facility] ORS;  Service: Orthopedics;  Laterality: Left;  . HIP SURGERY Left 05/02/2016  . SPINE SURGERY  1975   LS disk     Family History: Family History  Problem Relation Age of Onset  . Family history unknown: Yes     Social History: Social History   Social History  . Marital status: Widowed    Spouse name: N/A  . Number of children: N/A  . Years of education: N/A   Occupational History  . Not on file.   Social History Main Topics  . Smoking status: Former Smoker    Types: Cigarettes    Quit date: 05/02/1978  . Smokeless tobacco: Former Systems developer    Types: [place] date: 10/02/2012  . Alcohol use No  . Drug use: No  . Sexual activity: Not on file   Other Topics Concern  . Not on file   Social History Narrative  . No narrative on file     Review of  Systems: Review of Systems  Constitutional: Positive for malaise/fatigue. Negative for  chills, diaphoresis, fever and weight loss.  HENT: Negative.  Negative for congestion, ear discharge, ear pain, hearing loss, nosebleeds, sinus pain, sore throat and tinnitus.   Eyes: Negative.  Negative for blurred vision, double vision, photophobia, pain, discharge and redness.  Respiratory: Positive for cough, sputum production, shortness of breath and wheezing. Negative for hemoptysis and stridor.   Cardiovascular: Negative.  Negative for chest pain, palpitations, orthopnea, claudication, leg swelling and PND.  Gastrointestinal: Negative.  Negative for abdominal pain, blood in stool, constipation, diarrhea, heartburn, melena, nausea and vomiting.  Genitourinary: Negative.  Negative for dysuria, flank pain, frequency, hematuria and urgency.  Musculoskeletal: Negative.  Negative for back pain, falls, joint pain, myalgias and neck pain.  Skin: Negative.  Negative for itching and rash.  Neurological: Positive for weakness. Negative for dizziness, tingling, tremors, sensory change, speech change, focal weakness, seizures, loss of consciousness and headaches.  Endo/Heme/Allergies: Negative for environmental allergies and polydipsia. Does not bruise/bleed easily.  Psychiatric/Behavioral: Negative.  Negative for depression, hallucinations, memory loss, substance abuse and suicidal ideas. The patient is not nervous/anxious and does not have insomnia.     Vital Signs: Blood pressure 137/63, pulse 90, temperature 97.7 F (36.5 C), temperature source Oral, resp. rate 20, height 6' (1.829 m), weight 87.6 kg (193 lb 1.6 oz), SpO2 100 %.  Weight trends: Filed Weights   07/20/17 1003 07/20/17 1651 07/21/17 0406  Weight: 86.2 kg (190 lb) 86.2 kg (190 lb) 87.6 kg (193 lb 1.6 oz)    Physical Exam: General: NAD, laying in bed  Head: Normocephalic, atraumatic. Moist oral mucosal membranes  Eyes: Anicteric, PERRL   Neck: Supple, trachea midline  Lungs:  Clear to auscultation  Heart: Regular rate and rhythm  Abdomen:  Soft, nontender,   Extremities: no peripheral edema.  Neurologic: Nonfocal, moving all four extremities  Skin: No lesions        Lab results: Basic Metabolic Panel:  Recent Labs Lab 07/20/17 1134 07/20/17 1539 07/21/17 0533  07/21/17 2103 07/22/17 0337 07/22/17 0948  NA 114*  --  115*  < > 115* 114* 115*  K 5.2* 4.9 4.0  --   --  4.0  --   CL 85*  --  87*  --   --  87*  --   CO2 22  --  22  --   --  18*  --   GLUCOSE 86  --  87  --   --  164*  --   BUN 11  --  8  --   --  8  --   CREATININE 0.80  --  0.61  --   --  0.70  --   CALCIUM 8.4*  --  7.5*  --   --  7.8*  --   < > = values in this interval not displayed.  Liver Function Tests:  Recent Labs Lab 07/20/17 1134  AST 65*  ALT 67*  ALKPHOS 201*  BILITOT 1.8*  PROT 6.2*  ALBUMIN 3.4*   No results for input(s): LIPASE, AMYLASE in the last 168 hours. No results for input(s): AMMONIA in the last 168 hours.  CBC:  Recent Labs Lab 07/20/17 1134 07/21/17 0533  WBC 6.6 6.6  HGB 13.6 13.2  HCT 39.5* 38.4*  MCV 92.0 91.2  PLT 208 189    Cardiac Enzymes:  Recent Labs Lab 07/20/17 1134  CKTOTAL 59  TROPONINI <0.03    BNP: Invalid input(s): POCBNP  CBG:  Recent Labs Lab 07/21/17 0728 07/22/17 0757  GLUCAP 90 150*    Microbiology: Results for orders placed or performed during the hospital encounter of 07/20/17  Blood culture (routine x 2)     Status: None (Preliminary result)   Collection Time: 07/20/17  2:15 PM  Result Value Ref Range Status   Specimen Description BLOOD  LEFT AC   Final   Special Requests   Final    BOTTLES DRAWN AEROBIC AND ANAEROBIC Blood Culture adequate volume   Culture NO GROWTH 2 DAYS  Final   Report Status PENDING  Incomplete  Blood culture (routine x 2)     Status: None (Preliminary result)   Collection Time: 07/20/17  2:15 PM  Result Value Ref Range  Status   Specimen Description BLOOD RIGHT WRIST   Final   Special Requests   Final    BOTTLES DRAWN AEROBIC AND ANAEROBIC Blood Culture adequate volume   Culture NO GROWTH 2 DAYS  Final   Report Status PENDING  Incomplete    Coagulation Studies: No results for input(s): LABPROT, INR in the last 72 hours.  Urinalysis: No results for input(s): COLORURINE, LABSPEC, PHURINE, GLUCOSEU, HGBUR, BILIRUBINUR, KETONESUR, PROTEINUR, UROBILINOGEN, NITRITE, LEUKOCYTESUR in the last 72 hours.  Invalid input(s): APPERANCEUR    Imaging: Dg Chest 2 View  Result Date: 07/20/2017 CLINICAL DATA:  Shortness of breath. EXAM: CHEST  2 VIEW COMPARISON:  Chest x-ray dated May 02, 2016. FINDINGS: The cardiomediastinal silhouette is normal in size. Atherosclerotic calcification of the aortic arch. Diffuse reticulonodular interstitial markings, similar to prior study. New peripheral opacity in the left mid lung. Left basilar atelectasis. No pleural effusion or pneumothorax. No acute osseous abnormality. IMPRESSION: 1. New peripheral opacity in the left mid lung, which could represent pneumonia in the proper clinical setting. Followup PA and lateral chest X-ray is recommended in 3-4 weeks following trial of antibiotic therapy to ensure resolution and exclude underlying malignancy. 2. Similar appearing fibrotic changes within the lungs. Electronically Signed   By: Titus Dubin M.D.   On: 07/20/2017 11:41   Ct Head Wo Contrast  Result Date: 07/20/2017 CLINICAL DATA:  Fatigue and altered mental status EXAM: CT HEAD WITHOUT CONTRAST TECHNIQUE: Contiguous axial images were obtained from the base of the skull through the vertex without intravenous contrast. COMPARISON:  October 02, 2012 FINDINGS: Brain: There is mild diffuse atrophy. Right lateral ventricle is larger than the left lateral ventricle, a stable finding which may represent an anatomic variant. There is no intracranial mass, hemorrhage, extra-axial fluid  collection, or midline shift. There is patchy small vessel disease in the centra semiovale bilaterally. No acute infarct evident. Vascular: No hyperdense vessel. There are foci of calcification in each carotid artery. Skull: Bony calvarium appears intact. Sinuses/Orbits: There is mucosal thickening in several ethmoid air cells bilaterally. Other visualized paranasal sinuses are clear. Orbits appear symmetric bilaterally. Other: There is opacification in several inferior mastoid air cells on the right. Mastoids elsewhere clear. IMPRESSION: Mild atrophy with patchy periventricular small vessel disease, stable. No intracranial mass, hemorrhage, or evidence of infarct. No extra-axial fluid collection. Foci of arterial vascular calcification noted. There is mild inferior mastoid air cell disease on the right as well as mucosal thickening in several ethmoid air cells. Electronically Signed   By: Lowella Grip III M.D.   On: 07/20/2017 11:27   Ct Chest Wo Contrast  Result Date: 07/20/2017 CLINICAL DATA:  Progressive shortness of breath. Abnormal chest radiograph EXAM: CT CHEST WITHOUT CONTRAST TECHNIQUE: Multidetector CT imaging of the chest was performed following  the standard protocol without IV contrast. COMPARISON:  Chest radiograph July 20, 2017; chest CT May 04, 2016 FINDINGS: Cardiovascular: There is no appreciable thoracic aortic aneurysm. There is atherosclerotic calcification in the proximal visualized great vessels. There is atherosclerotic calcification throughout the aorta. There are multiple foci of coronary artery calcification. Pericardium is not appreciably thickened. Mediastinum/Nodes: Thyroid appears unremarkable. There is extensive adenopathy throughout the mediastinum. In the right pretracheal region, there is a lymph node measuring 2.8 x 2.4 cm. There are multiple lymph nodes adjacent to the aortic arch on the left. There is a focal lymph node in the aortopulmonary window measuring 1.9 x 1.8  cm. There are lymph nodes tracking anterior to the left main bronchus, largest measuring 2.0 x 1.2 cm. There is an adjacent lymph node measuring 1.6 x 1.0 cm. There are lymph nodes in the left hilum which are difficult to distinguish from adjacent vessel in the absence of contrast. There is a focal right hilar lymph node measuring 1.3 x 1.1 cm. There is a sub- carinal lymph node measuring 2.6 x 1.4 cm. There is a small hiatal hernia. Lungs/Pleura: There is widespread pulmonary fibrotic type change, slightly more severe overall compared to the prior study. There is an area that appears masslike in the posterior segment of the left upper lobe measuring 4.4 x 3.6 cm. There is nearby airspace consolidation in the posterior segment of the left upper lobe and in the superior segment of the left lower lobe. Areas of opacity in the apices are stable and likely represent chronic scarring and fibrosis as opposed to more acute pneumonia. There is chronic pleural thickening on the left posteriorly and inferiorly. There is no free-flowing effusion. There are foci of calcification along the left hemidiaphragm. There are small calcified granulomas in the superior segment of the left lower lobe peer Upper Abdomen: There is atherosclerotic calcification in the upper abdominal aorta. A slight amount of calcification is noted in the left adrenal. No adrenal masses are evident. There is no upper abdominal adenopathy. Musculoskeletal: There is degenerative change in the thoracic spine. There are no blastic or lytic bone lesions. IMPRESSION: 1. Apparent mass in the posterior segment of the left upper lobe with areas of nearby airspace consolidation in the posterior segment left upper lobe and superior segment left lower lobe. This masslike area measures 4.4 x 3.6 cm. 2. Multifocal adenopathy throughout the mediastinum and hilar regions. Neoplasm is suspected. Given this circumstance, correlation with nuclear medicine PET study may well  be advised. 3. Extensive underlying pulmonary fibrosis. Multiple areas of scarring. Small calcified granulomas in the superior segment left lower lobe. Note that the degree of pulmonary fibrosis has progressed since prior study. 4. Foci of left diaphragmatic calcification, likely due to previous asbestos exposure. 5.  Hiatal hernia present. 6. Foci of atherosclerotic calcification aorta as well as foci of coronary artery calcification. Aortic Atherosclerosis (ICD10-I70.0). Electronically Signed   By: Lowella Grip III M.D.   On: 07/20/2017 13:02      Assessment & Plan: Jeffrey Haynes is a 83 y.o. white male with COPD, hyperlipidemia, hypothyroidism, GERD/peptic ulcer disease, allergic rhinitis, atrial fibrillation, tremor, coronary artery diseasewho was admitted to [facility] 07[facility]2017 for Hyponatremia [E87.1] Weakness [R53.1] Community acquired pneumonia of left lung, unspecified part of lung [J18.9]   1. Hyponatremia: history and examination consistent with euvolemic hyponatremia.  - Discontinue IV fluids - Start fluid restriction 127mL - Check serum osm and urine osm, Check urine sodium - Check TSH -  q6 sodium checks.   2. Hypertension with atrial fibrillation: blood pressure at goal - Continue metoprolol. Do not recommend diuretics currently.     LOS: 2 Ripken Rekowski 8/15/201811:00 AM

## 2017-07-23 LAB — SODIUM
SODIUM: 117 mmol/L — AB (ref 135–145)
SODIUM: 117 mmol/L — AB (ref 135–145)
SODIUM: 118 mmol/L — AB (ref 135–145)

## 2017-07-23 LAB — BASIC METABOLIC PANEL
ANION GAP: 6 (ref 5–15)
BUN: 9 mg/dL (ref 6–20)
CHLORIDE: 90 mmol/L — AB (ref 101–111)
CO2: 21 mmol/L — ABNORMAL LOW (ref 22–32)
Calcium: 8.1 mg/dL — ABNORMAL LOW (ref 8.9–10.3)
Creatinine, Ser: 0.71 mg/dL (ref 0.61–1.24)
GFR calc Af Amer: >60 mL/min (ref >60)
Glucose, Bld: 107 mg/dL — ABNORMAL HIGH (ref 65–99)
POTASSIUM: 4.4 mmol/L (ref 3.5–5.1)
SODIUM: 117 mmol/L — AB (ref 135–145)

## 2017-07-23 LAB — GLUCOSE, CAPILLARY: GLUCOSE-CAPILLARY: 97 mg/dL (ref 65–99)

## 2017-07-23 MED ORDER — FUROSEMIDE 10 MG/ML IJ SOLN
INTRAMUSCULAR | Status: AC
  Filled 2017-07-23: qty 4

## 2017-07-23 MED ORDER — TOLVAPTAN 15 MG PO TABS
15.0000 mg | ORAL_TABLET | ORAL | Status: DC
  Administered 2017-07-23 – 2017-07-25 (×3): 15 mg via ORAL
  Filled 2017-07-23 (×3): qty 1

## 2017-07-23 MED ORDER — IPRATROPIUM-ALBUTEROL 0.5-2.5 (3) MG/3ML IN SOLN
3.0000 mL | Freq: Four times a day (QID) | RESPIRATORY_TRACT | Status: DC
  Administered 2017-07-23 – 2017-07-24 (×5): 3 mL via RESPIRATORY_TRACT
  Filled 2017-07-23 (×5): qty 3

## 2017-07-23 NOTE — Clinical Social Work Note (Signed)
CSW spoke to patient and his son who was at bedside.  Patient has been to [facility] for rehab in the past.  Patient and family are in agreement to having patient go to SNF again.  CSW was given permission to begin bed search process in [facility].  Formal assessment to follow.  Jones Broom. [place], MSW, [place]  07/23/2017 5:50 PM

## 2017-07-23 NOTE — Progress Notes (Signed)
Physical Therapy Treatment Patient Details Name: Jeffrey Haynes MRN: [id number] DOB: 1925-08-21 Today's Date: 07/23/2017    History of Present Illness Pt is a 83 y.o. male presenting to hospital with fatigue, SOB, and recent slurred speech.  Pt s/p fall July 25th with decreased function since then and another fall 07/19/17.  Pt admitted to hospital with severe hyponatremia, hyperkalemia, new lung mass, and community acquired PNA.  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    PT Comments    Pt initially soundly sleeping. Family member notes pt was eager to get out of bed earlier. Agreeable with family to awaken pt and attempt PT. Pt awoken through voice. Pt conversational, but requires increased effort to stay on focus of session. Pt offered up in the chair several times, as pt states he was to "go play games". Pt ultimately refuses up in chair. Agreeable to exercise in bed and demonstrates greater attention to tasks. Pt participates well with exercises with occasional need to re direct back to task at hand. Pt also shows therapist several exercises he does "every day twice a day". Pt repositioned in bed comfortably with pillows under calves (initially found sleeping with right leg out of bed). Continue PT as appropriate/tolerated (sodium continues low) to progress strength, endurance to improve functional mobility.    Follow Up Recommendations  SNF     Equipment Recommendations  Rolling walker with 5" wheels    Recommendations for Other Services       Precautions / Restrictions Precautions Precautions: Fall Restrictions Weight Bearing Restrictions: No    Mobility  Bed Mobility               General bed mobility comments: Not tested; pt declines out of bed  Transfers                    Ambulation/Gait                 Stairs            Wheelchair Mobility    Modified Rankin (Stroke  Patients Only)       Balance                                            Cognition Arousal/Alertness: Awake/alert (Initially sleeping) Behavior During Therapy: WFL for tasks assessed/performed Overall Cognitive Status: Impaired/Different from baseline Area of Impairment: Safety/judgement;Awareness;Attention                   Current Attention Level: Alternating     Safety/Judgement: Decreased awareness of safety;Decreased awareness of deficits     General Comments: Pt easily distracted and alternates from following instruction to wanting clothing to leave and money to "go play games". Pt likes casino games. Pt is able to participate in exercises well.       Exercises General Exercises - Lower Extremity Ankle Circles/Pumps: AROM;Both;15 reps;Supine Quad Sets: Strengthening;Both;15 reps;Supine Gluteal Sets: Strengthening;Both;15 reps;Supine Short Arc Quad: AROM;Both;15 reps;Supine Heel Slides: AROM;Both;15 reps;Supine (partial range) Hip ABduction/ADduction: AROM;Both;15 reps;Supine Other Exercises Other Exercises: Pt shows therapist clam shell exercise he normally does along with several UE exercises 10-15 each    General Comments        Pertinent Vitals/Pain Pain Assessment: No/denies pain    Home Living  Prior Function            PT Goals (current goals can now be found in the care plan section) Progress towards PT goals: Progressing toward goals    Frequency    Min 2X/week      PT Plan Current plan remains appropriate    Co-evaluation              AM-PAC PT "6 Clicks" Daily Activity  Outcome Measure  Difficulty turning over in bed (including adjusting bedclothes, sheets and blankets)?: Total Difficulty moving from lying on back to sitting on the side of the bed? : Total Difficulty sitting down on and standing up from a chair with arms (e.g., wheelchair, bedside commode, etc,.)?:  Total Help needed moving to and from a bed to chair (including a wheelchair)?: Total Help needed walking in hospital room?: Total Help needed climbing 3-5 steps with a railing? : Total 6 Click Score: 6    End of Session   Activity Tolerance: Patient tolerated treatment well Patient left: in bed;with call bell/phone within reach;with bed alarm set;with family/visitor present   PT Visit Diagnosis: Other abnormalities of gait and mobility (R26.89);Muscle weakness (generalized) (M62.81);History of falling (Z91.81)     Time: 3474-2595 PT Time Calculation (min) (ACUTE ONLY): 31 min  Charges:  $Therapeutic Exercise: 23-37 mins                    G Codes:  Functional Assessment Tool Used: AM-PAC 6 Clicks Basic Mobility     Larae Grooms, PTA 07/23/2017, 3:27 PM

## 2017-07-23 NOTE — Progress Notes (Signed)
Notified Md of Na of 117. No new orders at this time. Will continue to monitor and assess.

## 2017-07-23 NOTE — Plan of Care (Signed)
Problem: Safety: Goal: Ability to remain free from injury will improve Outcome: Progressing Pt will remain injury free while in my care. Pt instructed to call for assistance with activity. Exit alarm is activated and while in chair patient has chair alarm on. While patient is steady on his feet, he will still be encouraged to ask for assistance.

## 2017-07-23 NOTE — Progress Notes (Signed)
[facility]  ROUNDING NOTE   Subjective:   Son at bedside.   Patient with visual and auditory hallucinations. Son is asking for atorvastatin to be stopped.   Na 117.   Objective:  Vital signs in last 24 hours:  Temp:  [97.4 F (36.3 C)-98 F (36.7 C)] 98 F (36.7 C) (08/16 0441) Pulse Rate:  [73-80] 78 (08/16 0441) Resp:  [16-19] 19 (08/16 0441) BP: (126-141)/(55-66) 141/66 (08/16 0441) SpO2:  [97 %-98 %] 97 % (08/16 0441) Weight:  [90.2 kg (198 lb 12.8 oz)] 90.2 kg (198 lb 12.8 oz) (08/16 0441)  Weight change:  Filed Weights   07/20/17 1651 07/21/17 0406 07/23/17 0441  Weight: 86.2 kg (190 lb) 87.6 kg (193 lb 1.6 oz) 90.2 kg (198 lb 12.8 oz)    Intake/Output: I/O last 3 completed shifts: In: 1520 [P.O.:720; I.V.:750; IV Piggyback:50] Out: 50 [Urine:50]   Intake/Output this shift:  No intake/output data recorded.  Physical Exam: General: NAD, laying in bed  Head: Normocephalic, atraumatic. Moist oral mucosal membranes  Eyes: Anicteric, PERRL  Neck: Supple, trachea midline  Lungs:  Wheezing   Heart: Regular rate and rhythm  Abdomen:  Soft, nontender,   Extremities: no peripheral edema.  Neurologic: Alert to self and place  Skin: No lesions       Basic Metabolic Panel:  Recent Labs Lab 07/20/17 1134 07/20/17 1539 07/21/17 0533  07/22/17 0337 07/22/17 0948 07/22/17 1554 07/22/17 2128 07/23/17 0346 07/23/17 0944  NA 114*  --  115*  < > 114* 115* 115* 115* 117* 117*  K 5.2* 4.9 4.0  --  4.0  --   --   --  4.4  --   CL 85*  --  87*  --  87*  --   --   --  90*  --   CO2 22  --  22  --  18*  --   --   --  21*  --   GLUCOSE 86  --  87  --  164*  --   --   --  107*  --   BUN 11  --  8  --  8  --   --   --  9  --   CREATININE 0.80  --  0.61  --  0.70  --   --   --  0.71  --   CALCIUM 8.4*  --  7.5*  --  7.8*  --   --   --  8.1*  --   < > = values in this interval not displayed.  Liver Function Tests:  Recent Labs Lab 07/20/17 1134  AST  65*  ALT 67*  ALKPHOS 201*  BILITOT 1.8*  PROT 6.2*  ALBUMIN 3.4*   No results for input(s): LIPASE, AMYLASE in the last 168 hours. No results for input(s): AMMONIA in the last 168 hours.  CBC:  Recent Labs Lab 07/20/17 1134 07/21/17 0533  WBC 6.6 6.6  HGB 13.6 13.2  HCT 39.5* 38.4*  MCV 92.0 91.2  PLT 208 189    Cardiac Enzymes:  Recent Labs Lab 07/20/17 1134  CKTOTAL 84  TROPONINI <0.03    BNP: Invalid input(s): POCBNP  CBG:  Recent Labs Lab 07/21/17 0728 07/22/17 0757 07/22/17 1143 07/22/17 1626 07/23/17 0726  GLUCAP 90 150* 196* 193* 24    Microbiology: Results for orders placed or performed during the hospital encounter of 07/20/17  Blood culture (routine x 2)  Status: None (Preliminary result)   Collection Time: 07/20/17  2:15 PM  Result Value Ref Range Status   Specimen Description BLOOD  LEFT AC   Final   Special Requests   Final    BOTTLES DRAWN AEROBIC AND ANAEROBIC Blood Culture adequate volume   Culture NO GROWTH 3 DAYS  Final   Report Status PENDING  Incomplete  Blood culture (routine x 2)     Status: None (Preliminary result)   Collection Time: 07/20/17  2:15 PM  Result Value Ref Range Status   Specimen Description BLOOD RIGHT WRIST   Final   Special Requests   Final    BOTTLES DRAWN AEROBIC AND ANAEROBIC Blood Culture adequate volume   Culture NO GROWTH 3 DAYS  Final   Report Status PENDING  Incomplete    Coagulation Studies: No results for input(s): LABPROT, INR in the last 72 hours.  Urinalysis: No results for input(s): COLORURINE, LABSPEC, PHURINE, GLUCOSEU, HGBUR, BILIRUBINUR, KETONESUR, PROTEINUR, UROBILINOGEN, NITRITE, LEUKOCYTESUR in the last 72 hours.  Invalid input(s): APPERANCEUR    Imaging: No results found.   Medications:    . azithromycin  250 mg Oral Daily  . docusate sodium  100 mg Oral BID  . enoxaparin (LOVENOX) injection  40 mg Subcutaneous Q24H  . ipratropium-albuterol  3 mL Inhalation Q6H  .  levothyroxine  75 mcg Oral QAC breakfast  . metoprolol succinate  25 mg Oral Daily  . pantoprazole  40 mg Oral Daily  . sodium chloride  1 g Oral TID WC  . tolvaptan  15 mg Oral Q24H   acetaminophen **OR** acetaminophen, bisacodyl, ondansetron **OR** ondansetron (ZOFRAN) IV  Assessment/ Plan:  Jeffrey Haynes is a 83 y.o. white male [name] is a 83 y.o. white male with COPD, hyperlipidemia, hypothyroidism, GERD/peptic ulcer disease, allergic rhinitis, atrial fibrillation, tremor, coronary artery diseasewho was admitted to [facility] on 07[facility]20/2017 for Hyponatremia    1. Hyponatremia: history and examination consistent with euvolemic hyponatremia.  - Discontinued IV fluids. Continue fluid restriction 1286mL - q6 sodium checks.  - Start tolvaptan   2. Hypertension with atrial fibrillation: blood pressure at goal - Continue metoprolol. Do not recommend diuretics currently.   3. Coronary Artery Disease: hold atorvastatin, will discuss with cardiology.    LOS: 3 Jeffrey Haynes 8/16/201810:13 AM

## 2017-07-23 NOTE — Care Management (Signed)
Sodium slightly improved but remains critical. Physical therapy has recommended skilled nursing facility placement.  Notified CSW

## 2017-07-23 NOTE — Progress Notes (Signed)
[name] [facility] [place] NAME: Jeffrey Haynes    MR#:  [id number]  DATE OF BIRTH:  12-16-1924  SUBJECTIVE:  CHIEF COMPLAINT:   Chief Complaint  Patient presents with  . Fatigue  . Shortness of Breath   The patient is confused this morning. REVIEW OF SYSTEMS:  Review of Systems  Constitutional: Positive for malaise/fatigue. Negative for chills and fever.  HENT: Positive for hearing loss. Negative for sore throat.   Eyes: Negative for blurred vision and double vision.  Respiratory: Negative for cough, hemoptysis, shortness of breath, wheezing and stridor.   Cardiovascular: Negative for chest pain, palpitations, orthopnea and leg swelling.  Gastrointestinal: Negative for abdominal pain, blood in stool, diarrhea, melena, nausea and vomiting.  Genitourinary: Negative for dysuria, flank pain and hematuria.  Musculoskeletal: Negative for back pain and joint pain.  Skin: Negative for rash.  Neurological: Positive for weakness. Negative for dizziness, sensory change, focal weakness, seizures, loss of consciousness and headaches.  Endo/Heme/Allergies: Negative for polydipsia.  Psychiatric/Behavioral: Negative for depression. The patient is not nervous/anxious.     DRUG ALLERGIES:  No Known Allergies VITALS:  Blood pressure (!) 141/66, pulse 78, temperature 98 F (36.7 C), resp. rate 19, height 6' (1.829 m), weight 198 lb 12.8 oz (90.2 kg), SpO2 97 %. PHYSICAL EXAMINATION:  Physical Exam  Constitutional: He is oriented to person, place, and time and well-developed, well-nourished, and in no distress.  HENT:  Head: Normocephalic.  Mouth/Throat: Oropharynx is clear and moist.  Eyes: Pupils are equal, round, and reactive to light. Conjunctivae and EOM are normal. No scleral icterus.  Neck: Normal range of motion. Neck supple. No JVD present. No tracheal deviation present.  Cardiovascular: Normal rate, regular rhythm and normal heart sounds.  Exam  reveals no gallop.   No murmur heard. Pulmonary/Chest: Effort normal and breath sounds normal. No respiratory distress. He has no wheezes. He has no rales.  Abdominal: Soft. Bowel sounds are normal. He exhibits no distension. There is no tenderness. There is no rebound.  Musculoskeletal: Normal range of motion. He exhibits no edema or tenderness.  Neurological: He is alert and oriented to person, place, and time. No cranial nerve deficit.  Skin: No rash noted. No erythema.  Psychiatric: Affect normal.   LABORATORY PANEL:  Male CBC  Recent Labs Lab 07/21/17 0533  WBC 6.6  HGB 13.2  HCT 38.4*  PLT 189   ------------------------------------------------------------------------------------------------------------------ Chemistries   Recent Labs Lab 07/20/17 1134  07/23/17 0346 07/23/17 0944  NA 114*  < > 117* 117*  K 5.2*  < > 4.4  --   CL 85*  < > 90*  --   CO2 22  < > 21*  --   GLUCOSE 86  < > 107*  --   BUN 11  < > 9  --   CREATININE 0.80  < > 0.71  --   CALCIUM 8.4*  < > 8.1*  --   AST 65*  --   --   --   ALT 67*  --   --   --   ALKPHOS 201*  --   --   --   BILITOT 1.8*  --   --   --   < > = values in this interval not displayed. RADIOLOGY:  No results found. ASSESSMENT AND PLAN:   83 year old male ith a known history of COPD, hypertension, hyperlipidemia, paroxysmal atrial fibrillation being admitted For severe hyponatremia, hyperkalemia, and new lung  mass  * Severe hyponatremia, Na is still low at 117. - can be due to SIADH from underlying lung cancer versus poor by mouth intake fluid restriction 1230mL, Discontinued normal saline IV, on salt tablets q6h, Start tolvaptan and follow-up BMP q6h per Dr. Juleen China.  * Hyperkalemia: no EKG changes Given  one dose of Kayexalate and improved.  Acute respiratory failure due to pneumonia and COPD exacerbation. Improved. Discontinue IV Solu-Medrol, DuoNeb when necessary. Off oxygen.  * New lung mass Continue  antibiotics and repeat imaging in 6-8 weeks per Dr. Mortimer Fries.  * Community-acquired pneumonia - on IV Rocephin and Zithromax, changed to po.  Essential Hypertension. Continue Lopressor.  Generalized weakness. PT evaluation: SNF.  Discussed with Dr. Juleen China. All the records are reviewed and case discussed with Care Management/Social Worker. Management plans discussed with the patient,His daughter and the son and they are in agreement.  CODE STATUS: DNR  TOTAL TIME TAKING CARE OF THIS PATIENT: 38 minutes.   More than 50% of the time was spent in counseling/coordination of care: YES  POSSIBLE D/C IN 3 DAYS, DEPENDING ON CLINICAL CONDITION.   [name] M.D on 07/23/2017 at 11:03 AM  Between 7am to 6pm - Pager - [phone]  After 6pm go to [email or web address] - Proofreader  [facility]  Office  [phone]  CC: Primary care physician; [name], MD  Note: This dictation was prepared with Dragon dictation along with smaller phrase technology. Any transcriptional errors that result from this process are unintentional.

## 2017-07-23 NOTE — Progress Notes (Signed)
Lab reported sodium critical value of 118. Previous value was 117. MD notified

## 2017-07-24 ENCOUNTER — Inpatient Hospital Stay: Payer: Medicare Other

## 2017-07-24 LAB — SODIUM
SODIUM: 126 mmol/L — AB (ref 135–145)
Sodium: 120 mmol/L — ABNORMAL LOW (ref 135–145)
Sodium: 123 mmol/L — ABNORMAL LOW (ref 135–145)
Sodium: 124 mmol/L — ABNORMAL LOW (ref 135–145)

## 2017-07-24 LAB — GLUCOSE, CAPILLARY: GLUCOSE-CAPILLARY: 89 mg/dL (ref 65–99)

## 2017-07-24 MED ORDER — IPRATROPIUM-ALBUTEROL 0.5-2.5 (3) MG/3ML IN SOLN
3.0000 mL | Freq: Three times a day (TID) | RESPIRATORY_TRACT | Status: DC
  Administered 2017-07-24 – 2017-07-26 (×5): 3 mL via RESPIRATORY_TRACT
  Filled 2017-07-24 (×5): qty 3

## 2017-07-24 MED ORDER — METHYLPREDNISOLONE SODIUM SUCC 40 MG IJ SOLR
40.0000 mg | Freq: Two times a day (BID) | INTRAMUSCULAR | Status: DC
  Administered 2017-07-24 (×2): 40 mg via INTRAVENOUS
  Filled 2017-07-24 (×2): qty 1

## 2017-07-24 MED ORDER — AZITHROMYCIN 500 MG PO TABS
500.0000 mg | ORAL_TABLET | Freq: Every day | ORAL | Status: AC
  Administered 2017-07-25: 10:00:00 500 mg via ORAL
  Filled 2017-07-24: qty 1

## 2017-07-24 MED ORDER — IPRATROPIUM-ALBUTEROL 0.5-2.5 (3) MG/3ML IN SOLN: 3.0000 mL | Freq: Four times a day (QID) | RESPIRATORY_TRACT | Status: DC | PRN

## 2017-07-24 NOTE — Clinical Social Work Note (Signed)
Clinical Social Work Assessment  Patient Details  Name: Jeffrey Haynes MRN: [id number] Date of Birth: 1925/08/28  Date of referral:  07/24/17               Reason for consult:  Facility Placement                Permission sought to share information with:  Facility Sport and exercise psychologist, Family Supports Permission granted to share information::  Yes, Verbal Permission Granted  Name::     Calixto, Pavel [phone]  [phone]   Agency::  SNF admissions  Relationship::     Contact Information:     Housing/Transportation Living arrangements for the past 2 months:  Single Family Home Source of Information:  Adult Children Patient Interpreter Needed:  None Criminal Activity/Legal Involvement Pertinent to Current Situation/Hospitalization:  No - Comment as needed Significant Relationships:  Adult Children Lives with:  Adult Children Do you feel safe going back to the place where you live?  No Need for family participation in patient care:  Yes (Comment)  Care giving concerns:  Patient's son feels patient needs SNF for short term rehab before going back home.   Social Worker assessment / plan:  Patient is a 83 year old male who is alert and oriented x2, patient lives with his son.  Patient's son was at bedside, patient did not want to stay awake assessment was completed by speaking with patient's son.  Patient's son states that patient lives with him, and was doing well continuing to help out in the garden until a couple of weeks ago.  Patient's son reports that patient has been more tired lately, and not as active as he usually is.  Patient has been in rehab at [facility] in the past, and patient did well there according to son.  Patient's son expressed he is not sure what is wrong with his dad, and he is hoping he will start to get better soon.  Patient's son was quiet and teary eyed, patient's son expressed that his mom passed away several years ago, and patient is all he has  left.  Patient's son was explained role of CSW and process for looking for bed placement at SNF.  CSW explained to patient's son how insurance will pay for stay at [facility] and reminded him what to expect.  Patient's son did not have any other questions or concerns, CSW was given permission to begin bed search in [place].  Employment status:  Retired Nurse, adult PT Recommendations:  Tony / Referral to community resources:     Patient/Family's Response to care: Patient and son are agreeable to going to SNF for short term rehab.  Patient/Family's Understanding of and Emotional Response to Diagnosis, Current Treatment, and Prognosis:  Patient's son expressed sadness that his dad is not doing well.  Patient's son expressed that he is hopeful patient will improve enough to go to SNF, he currently is not sure what is going with patient, and he states physicians are trying to figure out why patient's sodium levels are not improving.  Emotional Assessment Appearance:  Appears stated age Attitude/Demeanor/Rapport:    Affect (typically observed):  Calm, Appropriate Orientation:  Oriented to Self, Oriented to Place Alcohol / Substance use:  Not Applicable Psych involvement (Current and /or in the community):  No (Comment)  Discharge Needs  Concerns to be addressed:  Lack of Support Readmission within the last 30 days:  No Current discharge risk:  Lack of support system Barriers to Discharge:  Continued Medical Work up   Anell Barr 07/24/2017, 9:32 AM

## 2017-07-24 NOTE — Care Management Important Message (Signed)
Important Message  Patient Details  Name: Jeffrey Haynes MRN: [id number] Date of Birth: September 29, 1925   Medicare Important Message Given:  Yes    Shelbie Ammons, RN 07/24/2017, 8:15 AM

## 2017-07-24 NOTE — NC FL2 (Signed)
[place] LEVEL OF CARE SCREENING TOOL     IDENTIFICATION  Patient Name: Jeffrey Haynes Birthdate: [birth year] Sex: male Admission Date (Current Location): 07/20/2017  [place] and [place] Number:  Engineering geologist and Address:  [facility], [address] [place], [place] [address]      Provider Number: [id number]  Attending Physician Name and Address:  [name], MD  Relative Name and Phone Number:  Khadar, Monger [phone]  [phone]     Current Level of Care: Hospital Recommended Level of Care: [place] Prior Approval Number:    Date Approved/Denied:   PASRR Number:   [id number]  Discharge Plan: SNF    Current Diagnoses: Patient Active Problem List   Diagnosis Date Noted  . Community acquired pneumonia of left lung   . Weakness   . Palliative care by specialist   . Goals of care, counseling/discussion   . Hyponatremia 07/20/2017  . Decreased hearing of both ears 10/29/2016  . New onset atrial fibrillation ([place]) 05/06/2016  . Respiratory distress 05/06/2016  . Fall   . Status post hip hemiarthroplasty   . Femur fracture ([place]) 05/02/2016  . COPD (chronic obstructive pulmonary disease) ([place]) 11/06/2015  . CAD (coronary artery disease) 11/06/2015  . Hypothyroidism 11/06/2015  . Hyperlipidemia 11/06/2015  . Familial tremor 11/06/2015  . CKD (chronic kidney disease) stage 3, GFR 30-59 ml/min 11/06/2015    Orientation RESPIRATION BLADDER Height & Weight     Self  Normal Incontinent Weight: 197 lb 3 oz (89.4 kg) Height:  6' (182.9 cm)  BEHAVIORAL SYMPTOMS/MOOD NEUROLOGICAL BOWEL NUTRITION STATUS      Incontinent Diet (Regular Fluid restriction)  AMBULATORY STATUS COMMUNICATION OF NEEDS Skin   Limited Assist Verbally Normal                       Personal Care Assistance Level of Assistance  Bathing, Feeding, Dressing Bathing Assistance: Limited assistance Feeding assistance: Limited  assistance Dressing Assistance: Limited assistance     Functional Limitations Info  Sight, Hearing, Speech Sight Info: Adequate Hearing Info: Adequate Speech Info: Adequate    SPECIAL CARE FACTORS FREQUENCY  PT (By licensed PT)     PT Frequency: 5x a week              Contractures Contractures Info: Not present    Additional Factors Info  Code Status, Allergies Code Status Info: DNR Allergies Info: nka           Current Medications (07/24/2017):  This is the current hospital active medication list Current Facility-Administered Medications  Medication Dose Route Frequency Provider Last Rate Last Dose  . acetaminophen (TYLENOL) tablet 650 mg  650 mg Oral Q6H PRN Max Sane, MD       Or  . acetaminophen (TYLENOL) suppository 650 mg  650 mg Rectal Q6H PRN Max Sane, MD      . Derrill Memo ON 07/25/2017] azithromycin (ZITHROMAX) tablet 500 mg  500 mg Oral Daily Mody, Sital, MD      . bisacodyl (DULCOLAX) EC tablet 5 mg  5 mg Oral Daily PRN Max Sane, MD      . docusate sodium (COLACE) capsule 100 mg  100 mg Oral BID Max Sane, MD   100 mg at 07/24/17 0815  . enoxaparin (LOVENOX) injection 40 mg  40 mg Subcutaneous Q24H [name], MD   40 mg at 07/23/17 2230  . ipratropium-albuterol (DUONEB) 0.5-2.5 (3) MG/3ML nebulizer solution 3 mL  3 mL Inhalation Q6H [name], MD   3 mL at 07/24/17 0804  . levothyroxine (SYNTHROID, LEVOTHROID) tablet 75 mcg  75 mcg Oral QAC breakfast Max Sane, MD   75 mcg at 07/24/17 0816  . methylPREDNISolone sodium succinate (SOLU-MEDROL) 40 mg/mL injection 40 mg  40 mg Intravenous Q12H Mody, Sital, MD      . metoprolol succinate (TOPROL-XL) 24 hr tablet 25 mg  25 mg Oral Daily Max Sane, MD   25 mg at 07/24/17 0816  . ondansetron (ZOFRAN) tablet 4 mg  4 mg Oral Q6H PRN Max Sane, MD       Or  . ondansetron (ZOFRAN) injection 4 mg  4 mg Intravenous Q6H PRN Max Sane, MD      . pantoprazole (PROTONIX) EC tablet 40 mg  40 mg Oral Daily Max Sane, MD   40 mg at 07/24/17 0816  . sodium chloride tablet 1 g  1 g Oral TID WC [name], MD   1 g at 07/24/17 [id number]  . tolvaptan (SAMSCA) tablet 15 mg  15 mg Oral Q24H Kolluru, Sarath, MD   15 mg at 07/24/17 3343     Discharge Medications: Please see discharge summary for a list of discharge medications.  Relevant Imaging Results:  Relevant Lab Results:   Additional Information SSN# [id number]  Anell Barr

## 2017-07-24 NOTE — Progress Notes (Signed)
Physical Therapy Treatment Patient Details Name: Jeffrey Haynes MRN: [id number] DOB: 18-May-1925 Today's Date: 07/24/2017    History of Present Illness Pt is a 83 y.o. male presenting to hospital with fatigue, SOB, and recent slurred speech.  Pt s/p fall July 25th with decreased function since then and another fall 07/19/17.  Pt admitted to hospital with severe hyponatremia, hyperkalemia, new lung mass, and community acquired PNA.  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    PT Comments    Jeffrey Haynes made modest progress today.  His cognition appears to be improving and was agreeable to OOB to chair.  He requires mod assist for bed mobility and min +2 assist for transfers due to instability and fatigue.  Did not attempt ambulation due to safety concerns as pt fatigues quickly with stand pivot transfer.    Follow Up Recommendations  SNF     Equipment Recommendations  Rolling walker with 5" wheels    Recommendations for Other Services       Precautions / Restrictions Precautions Precautions: Fall Restrictions Weight Bearing Restrictions: No    Mobility  Bed Mobility Overal bed mobility: Needs Assistance Bed Mobility: Supine to Sit     Supine to sit: Mod assist;HOB elevated     General bed mobility comments: Assist to advance LEs to EOB and to elevate trunk with HOB elevated.  Increased time and effort.  Transfers Overall transfer level: Needs assistance Equipment used: Rolling walker (2 wheeled) Transfers: Sit to/from [facility] Sit to Stand: Min assist;From elevated surface;+2 physical assistance Stand pivot transfers: Min assist;+2 physical assistance       General transfer comment: Bed elevated for ease and safety with standing.  Assist to boost to standing and to remain steady.  Cues for upright posture and forward gaze with pericare to prevent flexed posture.  When pivoting pt  requires assist at times for proper management of RW.  Halfway through pivoting pt reports fatigue and encouragement provided to complete pivot to chair.  Assist for hand placement on armrests prior to descent to chair.  Ambulation/Gait             General Gait Details: Deferred due to safety concerns   Stairs            Wheelchair Mobility    Modified Rankin (Stroke Patients Only)       Balance Overall balance assessment: Needs assistance;History of Falls Sitting-balance support: Single extremity supported;Feet supported Sitting balance-Leahy Scale: Poor Sitting balance - Comments: Relies on having at least 1UE supported when sitting EOB   Standing balance support: Bilateral upper extremity supported;During functional activity Standing balance-Leahy Scale: Poor Standing balance comment: Relies on UE support and outside physical assist for static and dynamic activities                            Cognition Arousal/Alertness: Awake/alert Behavior During Therapy: WFL for tasks assessed/performed Overall Cognitive Status: Impaired/Different from baseline (Although family reports this is improving) Area of Impairment: Safety/judgement;Awareness;Attention                   Current Attention Level: Selective     Safety/Judgement: Decreased awareness of safety;Decreased awareness of deficits Awareness: Intellectual          Exercises General Exercises - Upper Extremity Shoulder Flexion: AROM;Both;10 reps;Seated General Exercises - Lower Extremity Long Arc Quad: Both;10 reps;Seated Hip Flexion/Marching: Both;10  reps;Seated    General Comments General comments (skin integrity, edema, etc.): Pt's daughter and granddaughter in room during session.      Pertinent Vitals/Pain Pain Assessment: Faces Faces Pain Scale: Hurts little more Pain Location: "my back" Pain Descriptors / Indicators: Aching Pain Intervention(s): Limited activity within  patient's tolerance;Monitored during session;Repositioned    Home Living                      Prior Function            PT Goals (current goals can now be found in the care plan section) Acute Rehab PT Goals Patient Stated Goal: none specifically reported but pt excited about getting OOB to chair PT Goal Formulation: With patient/family Time For Goal Achievement: 08/05/17 Potential to Achieve Goals: Good Progress towards PT goals: Progressing toward goals    Frequency    Min 2X/week      PT Plan Current plan remains appropriate    Co-evaluation              AM-PAC PT "6 Clicks" Daily Activity  Outcome Measure  Difficulty turning over in bed (including adjusting bedclothes, sheets and blankets)?: Unable Difficulty moving from lying on back to sitting on the side of the bed? : Unable Difficulty sitting down on and standing up from a chair with arms (e.g., wheelchair, bedside commode, etc,.)?: Unable Help needed moving to and from a bed to chair (including a wheelchair)?: A Lot Help needed walking in hospital room?: A Lot Help needed climbing 3-5 steps with a railing? : Total 6 Click Score: 8    End of Session Equipment Utilized During Treatment: Gait belt Activity Tolerance: Patient tolerated treatment well;Patient limited by fatigue Patient left: with call bell/phone within reach;with family/visitor present;in chair;with chair alarm set;with nursing/sitter in room Nurse Communication: Mobility status PT Visit Diagnosis: Other abnormalities of gait and mobility (R26.89);Muscle weakness (generalized) (M62.81);History of falling (Z91.81)     Time: 1440-1506 PT Time Calculation (min) (ACUTE ONLY): 26 min  Charges:  $Therapeutic Activity: 23-37 mins                    G Codes:       Collie Siad PT, DPT 07/24/2017, 3:34 PM

## 2017-07-24 NOTE — Progress Notes (Signed)
New referral for Palliative to follow at SNF received from [place]. Referral made aware. SNF unknown at this time. Sarah to advise. Thank you. Flo Shanks Rn, BSN, [place] [facility] [place], [facility] [phone] c

## 2017-07-24 NOTE — Progress Notes (Signed)
[facility] [place] NAME: Jeffrey Haynes Haynes    MR#:  [id number]  DATE OF BIRTH:  1925/06/18  SUBJECTIVE:  Patient Still wheezing this am   REVIEW OF SYSTEMS:    Review of Systems  Constitutional: Negative for fever, chills weight loss HENT: Negative for ear pain, nosebleeds, congestion, facial swelling, rhinorrhea, neck pain, neck stiffness and ear discharge.   Respiratory: Negative for cough, shortness of breath, ++wheezing  Cardiovascular: Negative for chest pain, palpitations and leg swelling.  Gastrointestinal: Negative for heartburn, abdominal pain, vomiting, diarrhea or consitpation Genitourinary: Negative for dysuria, urgency, frequency, hematuria Musculoskeletal: Negative for back pain or joint pain Neurological: Negative for dizziness, seizures, syncope, focal weakness,  numbness and headaches.  Hematological: Does not bruise/bleed easily.  Psychiatric/Behavioral: Negative for hallucinations, ++confusion, dysphoric mood    Tolerating Diet: yes      DRUG ALLERGIES:  No Known Allergies  VITALS:  Blood pressure (!) 132/58, pulse 71, temperature (!) 97.4 F (36.3 C), temperature source Oral, resp. rate 16, height 6' (1.829 m), weight 89.4 kg (197 lb 3 oz), SpO2 97 %.  PHYSICAL EXAMINATION:  Constitutional: Appears well-developed and well-nourished. No distress. HENT: Normocephalic. [name] Oropharynx is clear and moist.  Eyes: Conjunctivae and EOM are normal. PERRLA, no scleral icterus.  Neck: Normal ROM. Neck supple. No JVD. No tracheal deviation. CVS: RRR, S1/S2 +, no murmurs, no gallops, no carotid bruit.  Pulmonary: Effort and breath sounds normal, no stridor, rhonchi, bilateral prolonged wheezing NO  rales.  Abdominal: Soft. BS +,  no distension, tenderness, rebound or guarding.  Musculoskeletal: Normal range of motion. No edema and no tenderness.  Neuro: Alert. CN 2-12 grossly intact. No focal deficits. Skin: Skin is warm and  dry. No rash noted. Psychiatric: confused    LABORATORY PANEL:   CBC  Recent Labs Lab 07/21/17 0533  WBC 6.6  HGB 13.2  HCT 38.4*  PLT 189   ------------------------------------------------------------------------------------------------------------------  Chemistries   Recent Labs Lab 07/20/17 1134  07/23/17 0346  07/24/17 0342  NA 114*  < > 117*  < > 120*  K 5.2*  < > 4.4  --   --   CL 85*  < > 90*  --   --   CO2 22  < > 21*  --   --   GLUCOSE 86  < > 107*  --   --   BUN 11  < > 9  --   --   CREATININE 0.80  < > 0.71  --   --   CALCIUM 8.4*  < > 8.1*  --   --   AST 65*  --   --   --   --   ALT 67*  --   --   --   --   ALKPHOS 201*  --   --   --   --   BILITOT 1.8*  --   --   --   --   < > = values in this interval not displayed. ------------------------------------------------------------------------------------------------------------------  Cardiac Enzymes  Recent Labs Lab 07/20/17 1134  TROPONINI <0.03   ------------------------------------------------------------------------------------------------------------------  RADIOLOGY:  No results found.   ASSESSMENT AND PLAN:   83 year old male with a history of COPD who presented with shortness of breath and found to have severe hyponatremia and possible new lung mass.  1. Hyponatremia: This is thought to be euvolemic hyponatremia Continue TOLVAPTAN as per nephrology consultation Sodium up to 120 this morning Continue to  monitor sodium levels  2. Acute hypoxic respiratory failure in the setting of Acute COPD exacerbation: Patient still with wheezing Start IV steroids Continue nebs Check chest x-ray Patient has been weaned off of oxygen   3. Community-acquired pneumonia: Patient currently on azithromycin. He was treated with Rocephin as well  4. Lung mass?: As per pulmonary treat for community acquired pneumonia then will repeat CT scan in 6 weeks.  5. Hypothyroid: Continue Synthroid  6.  Essential hypertension: Continue metoprolol  Physical therapy is recommended skilled nursing facility at discharge    Management plans discussed with the patient and he is in agreement.  CODE STATUS: dnr  TOTAL TIME TAKING CARE OF THIS PATIENT: 28 minutes.     POSSIBLE D/C 2 days to SNF, DEPENDING ON CLINICAL CONDITION.   Jeffrey Haynes Haynes M.D on 07/24/2017 at 8:25 AM  Between 7am to 6pm - Pager - [phone] After 6pm go to [email or web address] - password [email or web address] [facility]  Office  [phone]  CC: Primary care physician; Jeffrey Haynes Maple, MD  Note: This dictation was prepared with Dragon dictation along with smaller phrase technology. Any transcriptional errors that result from this process are unintentional.3

## 2017-07-24 NOTE — Clinical Social Work Note (Signed)
CSW consulted for "Palliative services to follow at SNF on discharge." CSW will follow up with bed offers and then update Hospice Liaison with choice. CSW will continue to follow.   Darden Dates, MSW, LCSW  Clinical Social Worker  [phone]

## 2017-07-24 NOTE — Progress Notes (Signed)
[facility]  ROUNDING NOTE   Subjective:   Son and Daughter at bedside.   Na 120   tolvaptan 15mg  PO daily.   More alert and oriented this morning.   IV solumedrol started for COPD exacerbation  Objective:  Vital signs in last 24 hours:  Temp:  [97.4 F (36.3 C)-97.9 F (36.6 C)] 97.6 F (36.4 C) (08/17 0800) Pulse Rate:  [70-77] 70 (08/17 0800) Resp:  [16-20] 20 (08/17 0800) BP: (106-133)/(58-91) 133/61 (08/17 0800) SpO2:  [95 %-97 %] 96 % (08/17 0800) Weight:  [89.4 kg (197 lb 3 oz)] 89.4 kg (197 lb 3 oz) (08/17 0500)  Weight change: -0.731 kg (-1 lb 9.8 oz) Filed Weights   07/21/17 0406 07/23/17 0441 07/24/17 0500  Weight: 87.6 kg (193 lb 1.6 oz) 90.2 kg (198 lb 12.8 oz) 89.4 kg (197 lb 3 oz)    Intake/Output: No intake/output data recorded.   Intake/Output this shift:  Total I/O In: 120 [P.O.:120] Out: 100 [Urine:100]  Physical Exam: General: NAD, laying in bed  Head: Normocephalic, atraumatic. Moist oral mucosal membranes  Eyes: Anicteric, PERRL  Neck: Supple, trachea midline  Lungs:  clear  Heart: Regular rate and rhythm  Abdomen:  Soft, nontender,   Extremities: no peripheral edema.  Neurologic: Alert to self and place  Skin: No lesions       Basic Metabolic Panel:  Recent Labs Lab 07/20/17 1134 07/20/17 1539 07/21/17 0533  07/22/17 0337  07/23/17 0346 07/23/17 0944 07/23/17 1531 07/23/17 2130 07/24/17 0342  NA 114*  --  115*  < > 114*  < > 117* 117* 117* 118* 120*  K 5.2* 4.9 4.0  --  4.0  --  4.4  --   --   --   --   CL 85*  --  87*  --  87*  --  90*  --   --   --   --   CO2 22  --  22  --  18*  --  21*  --   --   --   --   GLUCOSE 86  --  87  --  164*  --  107*  --   --   --   --   BUN 11  --  8  --  8  --  9  --   --   --   --   CREATININE 0.80  --  0.61  --  0.70  --  0.71  --   --   --   --   CALCIUM 8.4*  --  7.5*  --  7.8*  --  8.1*  --   --   --   --   < > = values in this interval not displayed.  Liver  Function Tests:  Recent Labs Lab 07/20/17 1134  AST 65*  ALT 67*  ALKPHOS 201*  BILITOT 1.8*  PROT 6.2*  ALBUMIN 3.4*   No results for input(s): LIPASE, AMYLASE in the last 168 hours. No results for input(s): AMMONIA in the last 168 hours.  CBC:  Recent Labs Lab 07/20/17 1134 07/21/17 0533  WBC 6.6 6.6  HGB 13.6 13.2  HCT 39.5* 38.4*  MCV 92.0 91.2  PLT 208 189    Cardiac Enzymes:  Recent Labs Lab 07/20/17 1134  CKTOTAL 84  TROPONINI <0.03    BNP: Invalid input(s): POCBNP  CBG:  Recent Labs Lab 07/22/17 0757 07/22/17 1143 07/22/17 1626 07/23/17 0726 07/24/17 0754  GLUCAP 150* 196* 193* 97 52    Microbiology: Results for orders placed or performed during the hospital encounter of 07/20/17  Blood culture (routine x 2)     Status: None (Preliminary result)   Collection Time: 07/20/17  2:15 PM  Result Value Ref Range Status   Specimen Description BLOOD  LEFT AC   Final   Special Requests   Final    BOTTLES DRAWN AEROBIC AND ANAEROBIC Blood Culture adequate volume   Culture NO GROWTH 4 DAYS  Final   Report Status PENDING  Incomplete  Blood culture (routine x 2)     Status: None (Preliminary result)   Collection Time: 07/20/17  2:15 PM  Result Value Ref Range Status   Specimen Description BLOOD RIGHT WRIST   Final   Special Requests   Final    BOTTLES DRAWN AEROBIC AND ANAEROBIC Blood Culture adequate volume   Culture NO GROWTH 4 DAYS  Final   Report Status PENDING  Incomplete    Coagulation Studies: No results for input(s): LABPROT, INR in the last 72 hours.  Urinalysis: No results for input(s): COLORURINE, LABSPEC, PHURINE, GLUCOSEU, HGBUR, BILIRUBINUR, KETONESUR, PROTEINUR, UROBILINOGEN, NITRITE, LEUKOCYTESUR in the last 72 hours.  Invalid input(s): APPERANCEUR    Imaging: Dg Chest 1 View  Result Date: 07/24/2017 CLINICAL DATA:  Pneumonia. EXAM: CHEST 1 VIEW COMPARISON:  CT chest and chest x-ray dated July 20, 2017. FINDINGS: The  cardiomediastinal silhouette is unchanged. Diffuse reticulonodular interstitial markings are similar to prior study. Unchanged peripheral opacity in the left mid lung, better evaluated on recent chest CT. Left basilar atelectasis, unchanged. Possible small left pleural effusion. No pneumothorax. No acute osseous abnormality. IMPRESSION: 1. Unchanged peripheral opacity in the left midlung, corresponding to the mass seen on CT. No new consolidation. 2. Possible new small left pleural effusion. 3. Pulmonary fibrosis, unchanged. Electronically Signed   By: Titus Dubin M.D.   On: 07/24/2017 08:59     Medications:    . [START ON 07/25/2017] azithromycin  500 mg Oral Daily  . docusate sodium  100 mg Oral BID  . enoxaparin (LOVENOX) injection  40 mg Subcutaneous Q24H  . ipratropium-albuterol  3 mL Inhalation Q6H  . levothyroxine  75 mcg Oral QAC breakfast  . methylPREDNISolone (SOLU-MEDROL) injection  40 mg Intravenous Q12H  . metoprolol succinate  25 mg Oral Daily  . pantoprazole  40 mg Oral Daily  . sodium chloride  1 g Oral TID WC  . tolvaptan  15 mg Oral Q24H   acetaminophen **OR** acetaminophen, bisacodyl, ondansetron **OR** ondansetron (ZOFRAN) IV  Assessment/ Plan:  [name] is a 83 y.o. white male Mr. KIRON OSMUN is a 83 y.o. white male with COPD, hyperlipidemia, hypothyroidism, GERD/peptic ulcer disease, allergic rhinitis, atrial fibrillation, tremor, coronary artery diseasewho was admitted to [facility] on 07/20/2017 for Hyponatremia    1. Hyponatremia: history and examination consistent with euvolemic hyponatremia.  - Continue fluid restriction 128mL - q6 sodium checks.  - Continue tolvaptan   2. Hypertension with atrial fibrillation: blood pressure at goal - Continue metoprolol. Do not recommend diuretics currently.   3. Coronary Artery Disease: hold atorvastatin, have discussed with cardiology.   4. Acute exacerbation of COPD: continue supportive care - oxygen -  systemic steroids.    LOS: Powers, Mose Colaizzi 8/17/201811:34 AM

## 2017-07-24 NOTE — Clinical Social Work Placement (Signed)
   CLINICAL SOCIAL WORK PLACEMENT  NOTE  Date:  07/24/2017  Patient Details  Name: Jeffrey Haynes MRN: [id number] Date of Birth: 08/29/1925  Clinical Social Work is seeking post-discharge placement for this patient at the [place] level of care (*CSW will initial, date and re-position this form in  chart as items are completed):  Yes   Patient/family provided with [facility] Work Department's list of facilities offering this level of care within the geographic area requested by the patient (or if unable, by the patient's family).  Yes   Patient/family informed of their freedom to choose among providers that offer the needed level of care, that participate in Medicare, Medicaid or managed care program needed by the patient, have an available bed and are willing to accept the patient.  Yes   Patient/family informed of [facility]'s ownership interest in [facility] and [facility], as well as of the fact that they are under no obligation to receive care at these facilities.  PASRR submitted to EDS on 07/24/17     PASRR number received on       Existing PASRR number confirmed on 07/24/17     FL2 transmitted to all facilities in geographic area requested by pt/family on 07/24/17     FL2 transmitted to all facilities within larger geographic area on       Patient informed that his/her managed care company has contracts with or will negotiate with certain facilities, including the following:            Patient/family informed of bed offers received.  Patient chooses bed at       Physician recommends and patient chooses bed at      Patient to be transferred to   on  .  Patient to be transferred to facility by       Patient family notified on   of transfer.  Name of family member notified:        PHYSICIAN Please sign FL2, Please sign DNR     Additional Comment:    _______________________________________________ Ross Ludwig,  LCSWA 07/24/2017, 9:43 AM

## 2017-07-25 LAB — CULTURE, BLOOD (ROUTINE X 2)
CULTURE: NO GROWTH
Culture: NO GROWTH
SPECIAL REQUESTS: ADEQUATE
SPECIAL REQUESTS: ADEQUATE

## 2017-07-25 LAB — BASIC METABOLIC PANEL
Anion gap: 7 (ref 5–15)
BUN: 11 mg/dL (ref 6–20)
CHLORIDE: 96 mmol/L — AB (ref 101–111)
CO2: 25 mmol/L (ref 22–32)
CREATININE: 0.82 mg/dL (ref 0.61–1.24)
Calcium: 8.7 mg/dL — ABNORMAL LOW (ref 8.9–10.3)
GFR calc Af Amer: >60 mL/min (ref >60)
GFR calc non Af Amer: >60 mL/min (ref >60)
Glucose, Bld: 121 mg/dL — ABNORMAL HIGH (ref 65–99)
Potassium: 4.4 mmol/L (ref 3.5–5.1)
Sodium: 128 mmol/L — ABNORMAL LOW (ref 135–145)

## 2017-07-25 LAB — CBC
HEMATOCRIT: 41.1 % (ref 40.0–52.0)
HEMOGLOBIN: 14.3 g/dL (ref 13.0–18.0)
MCH: 31.9 pg (ref 26.0–34.0)
MCHC: 34.7 g/dL (ref 32.0–36.0)
MCV: 92.1 fL (ref 80.0–100.0)
Platelets: 216 10*3/uL (ref 150–440)
RBC: 4.47 MIL/uL (ref 4.40–5.90)
RDW: 14 % (ref 11.5–14.5)
WBC: 9.5 10*3/uL (ref 3.8–10.6)

## 2017-07-25 LAB — SODIUM
SODIUM: 129 mmol/L — AB (ref 135–145)
Sodium: 128 mmol/L — ABNORMAL LOW (ref 135–145)

## 2017-07-25 LAB — GLUCOSE, CAPILLARY: Glucose-Capillary: 115 mg/dL — ABNORMAL HIGH (ref 65–99)

## 2017-07-25 MED ORDER — METHYLPREDNISOLONE SODIUM SUCC 40 MG IJ SOLR
40.0000 mg | Freq: Every day | INTRAMUSCULAR | Status: DC
  Administered 2017-07-25 – 2017-07-26 (×2): 40 mg via INTRAVENOUS
  Filled 2017-07-25 (×2): qty 1

## 2017-07-25 MED ORDER — SODIUM CHLORIDE 0.9% FLUSH
3.0000 mL | Freq: Two times a day (BID) | INTRAVENOUS | Status: DC
  Administered 2017-07-25 – 2017-07-26 (×3): 3 mL via INTRAVENOUS

## 2017-07-25 NOTE — Progress Notes (Signed)
Physical Therapy Treatment Patient Details Name: Jeffrey Haynes MRN: [id number] DOB: 15-Jun-1925 Today's Date: 07/25/2017    History of Present Illness Pt is a 83 y.o. male presenting to hospital with fatigue, SOB, and recent slurred speech.  Pt s/p fall July 25th with decreased function since then and another fall 07/19/17.  Pt admitted to hospital with severe hyponatremia, hyperkalemia, new lung mass, and community acquired PNA.  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    PT Comments    Patient continues to have wheezing throughout session, though O2 sats do not reflect any overt hypoxia (>91% on room air). He reports he is having muscle pain in R anterior thigh with SLRs and throughout transfers, no pain on palpation. He continues to demonstrate generalized weakness, once in standing he is unable to lift his LLE to bring to towards bedside chair due to weakness, he is only able to scoot his RLE 3-4" at at time and becomes quite fatigued after standing 1-2 minutes. At this point he asks to lay back down in bed due to fatigue. He continues to be quite limited with his mobility and would benefit from short term rehabilitation to increase his functional mobility.    Follow Up Recommendations  SNF     Equipment Recommendations  Rolling walker with 5" wheels    Recommendations for Other Services       Precautions / Restrictions Precautions Precautions: Fall Restrictions Weight Bearing Restrictions: No    Mobility  Bed Mobility Overal bed mobility: Needs Assistance Bed Mobility: Supine to Sit;Sit to Supine     Supine to sit: Mod assist;HOB elevated Sit to supine: Max assist;Mod assist   General bed mobility comments: Patient is able to assist with bringing LEs to the EOB, however he requires fairly significant trunk support/assistance to complete transfer.   Transfers Overall transfer level: Needs  assistance Equipment used: Rolling walker (2 wheeled) Transfers: Sit to/from Stand Sit to Stand: Mod assist         General transfer comment: Patient is able to complete sit to stand transfer from low bed height with therapist providing fairly significant assistance for coming upright.   Ambulation/Gait Ambulation/Gait assistance: Max assist Ambulation Distance (Feet): 0 Feet Assistive device: Rolling walker (2 wheeled)     Gait velocity interpretation: <1.8 ft/sec, indicative of risk for recurrent falls General Gait Details: Patient made attempts to advance his LLE towards the chair but reports "it just doesn't want to work". He is able to slide his RLE towards LLE, but not able to take any meaningful steps and begins to fatigue.    Stairs            Wheelchair Mobility    Modified Rankin (Stroke Patients Only)       Balance Overall balance assessment: Needs assistance;History of Falls Sitting-balance support: Feet supported;Bilateral upper extremity supported Sitting balance-Leahy Scale: Fair     Standing balance support: Bilateral upper extremity supported Standing balance-Leahy Scale: Poor Standing balance comment: Patient is unable to change center of mass whatsoever.                             Cognition Arousal/Alertness: Awake/alert Behavior During Therapy: WFL for tasks assessed/performed Overall Cognitive Status: History of cognitive impairments - at baseline  General Comments: Patient was able to participate throughout all exercises and follows commands well.      Exercises General Exercises - Lower Extremity Ankle Circles/Pumps: AROM;Both;15 reps;Supine Long Arc Quad: AAROM;AROM;20 reps;Both Heel Slides: AAROM;Both;15 reps Hip ABduction/ADduction: AAROM;Both;15 reps Straight Leg Raises: AAROM;Both;15 reps Hip Flexion/Marching: AROM;Both;10 reps    General Comments        Pertinent  Vitals/Pain Pain Assessment: Faces Faces Pain Scale: Hurts even more Pain Location: R thigh Pain Descriptors / Indicators: Aching Pain Intervention(s): Monitored during session;Limited activity within patient's tolerance    Home Living                      Prior Function            PT Goals (current goals can now be found in the care plan section) Acute Rehab PT Goals Patient Stated Goal: none specifically reported but pt excited about getting OOB to chair PT Goal Formulation: With patient/family Time For Goal Achievement: 08/05/17 Potential to Achieve Goals: Fair Progress towards PT goals: Progressing toward goals    Frequency    Min 2X/week      PT Plan Current plan remains appropriate    Co-evaluation              AM-PAC PT "6 Clicks" Daily Activity  Outcome Measure  Difficulty turning over in bed (including adjusting bedclothes, sheets and blankets)?: A Lot Difficulty moving from lying on back to sitting on the side of the bed? : A Lot Difficulty sitting down on and standing up from a chair with arms (e.g., wheelchair, bedside commode, etc,.)?: A Lot Help needed moving to and from a bed to chair (including a wheelchair)?: Total Help needed walking in hospital room?: Total Help needed climbing 3-5 steps with a railing? : Total 6 Click Score: 9    End of Session Equipment Utilized During Treatment: Gait belt Activity Tolerance: Patient tolerated treatment well;Patient limited by fatigue Patient left: with call bell/phone within reach;in bed;with bed alarm set Nurse Communication: Mobility status (Pain in R thigh) PT Visit Diagnosis: Other abnormalities of gait and mobility (R26.89);Muscle weakness (generalized) (M62.81);History of falling (Z91.81)     Time: 2641-5830 PT Time Calculation (min) (ACUTE ONLY): 23 min  Charges:  $Gait Training: 8-22 mins $Therapeutic Exercise: 8-22 mins                    G Codes:      Royce Macadamia PT, DPT,  CSCS    07/25/2017, 3:25 PM

## 2017-07-25 NOTE — Progress Notes (Signed)
[place] at [place] NAME: Jeffrey Haynes    MR#:  [id number]  DATE OF BIRTH:  08/27/1925  SUBJECTIVE:   wheezing better this am    REVIEW OF SYSTEMS:    Review of Systems  Constitutional: Negative for fever, chills weight loss HENT: Negative for ear pain, nosebleeds, congestion, facial swelling, rhinorrhea, neck pain, neck stiffness and ear discharge.   Respiratory: Negative for cough, shortness of breath, no wheezing Cardiovascular: Negative for chest pain, palpitations and leg swelling.  Gastrointestinal: Negative for heartburn, abdominal pain, vomiting, diarrhea or consitpation Genitourinary: Negative for dysuria, urgency, frequency, hematuria Musculoskeletal: Negative for back pain or joint pain Neurological: Negative for dizziness, seizures, syncope, focal weakness,  numbness and headaches.  Hematological: Does not bruise/bleed easily.  Psychiatric/Behavioral: Negative for hallucinations,  dysphoric mood    Tolerating Diet: yes      DRUG ALLERGIES:  No Known Allergies  VITALS:  Blood pressure (!) 145/65, pulse 74, temperature 98 F (36.7 C), temperature source Oral, resp. rate (!) 22, height 6' (1.829 m), weight 87.5 kg (193 lb), SpO2 96 %.  PHYSICAL EXAMINATION:  Constitutional: Appears well-developed and well-nourished. No distress. HENT: Normocephalic. [name] Oropharynx is clear and moist.  Eyes: Conjunctivae and EOM are normal. PERRLA, no scleral icterus.  Neck: Normal ROM. Neck supple. No JVD. No tracheal deviation. CVS: RRR, S1/S2 +, no murmurs, no gallops, no carotid bruit.  Pulmonary: Effort and breath sounds normal, no stridor, rhonchi, NO wheezing NO  rales.  Abdominal: Soft. BS +,  no distension, tenderness, rebound or guarding.  Musculoskeletal: Normal range of motion. No edema and no tenderness.  Neuro: Alert. CN 2-12 grossly intact. No focal deficits. Skin: Skin is warm and dry. No rash noted. Psychiatric: good  affect   LABORATORY PANEL:   CBC  Recent Labs Lab 07/25/17 0319  WBC 9.5  HGB 14.3  HCT 41.1  PLT 216   ------------------------------------------------------------------------------------------------------------------  Chemistries   Recent Labs Lab 07/20/17 1134  07/25/17 0319  NA 114*  < > 128*  K 5.2*  < > 4.4  CL 85*  < > 96*  CO2 22  < > 25  GLUCOSE 86  < > 121*  BUN 11  < > 11  CREATININE 0.80  < > 0.82  CALCIUM 8.4*  < > 8.7*  AST 65*  --   --   ALT 67*  --   --   ALKPHOS 201*  --   --   BILITOT 1.8*  --   --   < > = values in this interval not displayed. ------------------------------------------------------------------------------------------------------------------  Cardiac Enzymes  Recent Labs Lab 07/20/17 1134  TROPONINI <0.03   ------------------------------------------------------------------------------------------------------------------  RADIOLOGY:  Dg Chest 1 View  Result Date: 07/24/2017 CLINICAL DATA:  Pneumonia. EXAM: CHEST 1 VIEW COMPARISON:  CT chest and chest x-ray dated July 20, 2017. FINDINGS: The cardiomediastinal silhouette is unchanged. Diffuse reticulonodular interstitial markings are similar to prior study. Unchanged peripheral opacity in the left mid lung, better evaluated on recent chest CT. Left basilar atelectasis, unchanged. Possible small left pleural effusion. No pneumothorax. No acute osseous abnormality. IMPRESSION: 1. Unchanged peripheral opacity in the left midlung, corresponding to the mass seen on CT. No new consolidation. 2. Possible new small left pleural effusion. 3. Pulmonary fibrosis, unchanged. Electronically Signed   By: Titus Dubin M.D.   On: 07/24/2017 08:59     ASSESSMENT AND PLAN:   83 year old male with a history of COPD who presented  with shortness of breath and found to have severe hyponatremia and possible new lung mass.  1. Hyponatremia: This is thought to be euvolemic hyponatremia Continue  TOLVAPTAN as per nephrology consultation 1200 mL fluid restriction Sodium up to 128 this morning Continue to monitor sodium levels  2. Acute hypoxic respiratory failure in the setting of Acute COPD exacerbation:  IV steroids Continue nebs Patient has been weaned off of oxygen   3. Community-acquired pneumonia: Patient currently on azithromycin. He was treated with Rocephin as well  4. Lung mass: As per pulmonary treat for community acquired pneumonia then will repeat CT scan in 6 weeks.  5. Hypothyroid: Continue Synthroid  6. Essential hypertension: Continue metoprolol  Physical therapy is recommended skilled nursing facility at discharge    Management plans discussed with the patient and he is in agreement.  CODE STATUS: dnr  TOTAL TIME TAKING CARE OF THIS PATIENT: 28 minutes.     POSSIBLE D/C tomorrow SNF, DEPENDING ON CLINICAL CONDITION.   Daysean Tinkham M.D on 07/25/2017 at 8:03 AM  Between 7am to 6pm - Pager - [phone] After 6pm go to [email or web address] - password [email or web address] [facility]  Office  [phone]  CC: Primary care physician; [name], MD  Note: This dictation was prepared with Dragon dictation along with smaller phrase technology. Any transcriptional errors that result from this process are unintentional.3

## 2017-07-25 NOTE — Clinical Social Work Note (Signed)
CSW met with the patient and his daughter to present bed offers. They chose [facility]. The facility is aware and can accept the patient as soon as tomorrow (Sunday, 07/26/17).  CSW will facilitate discharge including non-emergent EMS.  Santiago Bumpers, MSW, Latanya Presser [phone]

## 2017-07-25 NOTE — Progress Notes (Signed)
[facility]  ROUNDING NOTE   Subjective:   Daughter at bedside.   Na 128  tolvaptan 15mg  PO daily.   More alert and oriented this morning.   Objective:  Vital signs in last 24 hours:  Temp:  [97.5 F (36.4 C)-98 F (36.7 C)] 97.5 F (36.4 C) (08/18 1200) Pulse Rate:  [66-78] 66 (08/18 1200) Resp:  [20-22] 22 (08/18 0418) BP: (105-145)/(56-65) 111/56 (08/18 1200) SpO2:  [93 %-97 %] 93 % (08/18 1200) Weight:  [87.5 kg (193 lb)] 87.5 kg (193 lb) (08/18 0432)  Weight change: -1.899 kg (-4 lb 3 oz) Filed Weights   07/23/17 0441 07/24/17 0500 07/25/17 0432  Weight: 90.2 kg (198 lb 12.8 oz) 89.4 kg (197 lb 3 oz) 87.5 kg (193 lb)    Intake/Output: I/O last 3 completed shifts: In: 360 [P.O.:360] Out: 100 [Urine:100]   Intake/Output this shift:  Total I/O In: 120 [P.O.:120] Out: -   Physical Exam: General: NAD, laying in bed  Head: Normocephalic, atraumatic. Moist oral mucosal membranes  Eyes: Anicteric, PERRL  Neck: Supple, trachea midline  Lungs:  clear  Heart: Regular rate and rhythm  Abdomen:  Soft, nontender,   Extremities: no peripheral edema.  Neurologic: Alert to self and place  Skin: No lesions       Basic Metabolic Panel:  Recent Labs Lab 07/20/17 1134 07/20/17 1539 07/21/17 0533  07/22/17 0337  07/23/17 0346  07/24/17 1135 07/24/17 1541 07/24/17 2141 07/25/17 0319 07/25/17 0944  NA 114*  --  115*  < > 114*  < > 117*  < > 123* 124* 126* 128* 128*  K 5.2* 4.9 4.0  --  4.0  --  4.4  --   --   --   --  4.4  --   CL 85*  --  87*  --  87*  --  90*  --   --   --   --  96*  --   CO2 22  --  22  --  18*  --  21*  --   --   --   --  25  --   GLUCOSE 86  --  87  --  164*  --  107*  --   --   --   --  121*  --   BUN 11  --  8  --  8  --  9  --   --   --   --  11  --   CREATININE 0.80  --  0.61  --  0.70  --  0.71  --   --   --   --  0.82  --   CALCIUM 8.4*  --  7.5*  --  7.8*  --  8.1*  --   --   --   --  8.7*  --   < > = values in this  interval not displayed.  Liver Function Tests:  Recent Labs Lab 07/20/17 1134  AST 65*  ALT 67*  ALKPHOS 201*  BILITOT 1.8*  PROT 6.2*  ALBUMIN 3.4*   No results for input(s): LIPASE, AMYLASE in the last 168 hours. No results for input(s): AMMONIA in the last 168 hours.  CBC:  Recent Labs Lab 07/20/17 1134 07/21/17 0533 07/25/17 0319  WBC 6.6 6.6 9.5  HGB 13.6 13.2 14.3  HCT 39.5* 38.4* 41.1  MCV 92.0 91.2 92.1  PLT 208 189 216    Cardiac Enzymes:  Recent Labs  Lab 07/20/17 1134  CKTOTAL 84  TROPONINI <0.03    BNP: Invalid input(s): POCBNP  CBG:  Recent Labs Lab 07/22/17 1143 07/22/17 1626 07/23/17 0726 07/24/17 0754 07/25/17 0805  GLUCAP 196* 193* 97 89 115*    Microbiology: Results for orders placed or performed during the hospital encounter of 07/20/17  Blood culture (routine x 2)     Status: None   Collection Time: 07/20/17  2:15 PM  Result Value Ref Range Status   Specimen Description BLOOD  LEFT AC   Final   Special Requests   Final    BOTTLES DRAWN AEROBIC AND ANAEROBIC Blood Culture adequate volume   Culture NO GROWTH 5 DAYS  Final   Report Status 07/25/2017 FINAL  Final  Blood culture (routine x 2)     Status: None   Collection Time: 07/20/17  2:15 PM  Result Value Ref Range Status   Specimen Description BLOOD RIGHT WRIST   Final   Special Requests   Final    BOTTLES DRAWN AEROBIC AND ANAEROBIC Blood Culture adequate volume   Culture NO GROWTH 5 DAYS  Final   Report Status 07/25/2017 FINAL  Final    Coagulation Studies: No results for input(s): LABPROT, INR in the last 72 hours.  Urinalysis: No results for input(s): COLORURINE, LABSPEC, PHURINE, GLUCOSEU, HGBUR, BILIRUBINUR, KETONESUR, PROTEINUR, UROBILINOGEN, NITRITE, LEUKOCYTESUR in the last 72 hours.  Invalid input(s): APPERANCEUR    Imaging: Dg Chest 1 View  Result Date: 07/24/2017 CLINICAL DATA:  Pneumonia. EXAM: CHEST 1 VIEW COMPARISON:  CT chest and chest x-ray  dated July 20, 2017. FINDINGS: The cardiomediastinal silhouette is unchanged. Diffuse reticulonodular interstitial markings are similar to prior study. Unchanged peripheral opacity in the left mid lung, better evaluated on recent chest CT. Left basilar atelectasis, unchanged. Possible small left pleural effusion. No pneumothorax. No acute osseous abnormality. IMPRESSION: 1. Unchanged peripheral opacity in the left midlung, corresponding to the mass seen on CT. No new consolidation. 2. Possible new small left pleural effusion. 3. Pulmonary fibrosis, unchanged. Electronically Signed   By: Titus Dubin M.D.   On: 07/24/2017 08:59     Medications:    . docusate sodium  100 mg Oral BID  . enoxaparin (LOVENOX) injection  40 mg Subcutaneous Q24H  . ipratropium-albuterol  3 mL Inhalation TID  . levothyroxine  75 mcg Oral QAC breakfast  . methylPREDNISolone (SOLU-MEDROL) injection  40 mg Intravenous Daily  . metoprolol succinate  25 mg Oral Daily  . pantoprazole  40 mg Oral Daily  . sodium chloride flush  3 mL Intravenous Q12H  . sodium chloride  1 g Oral TID WC   acetaminophen **OR** acetaminophen, bisacodyl, ipratropium-albuterol, ondansetron **OR** ondansetron (ZOFRAN) IV  Assessment/ Plan:  Jeffrey Haynes is a 83 y.o. white male Jeffrey Haynes is a 83 y.o. white male with COPD, hyperlipidemia, hypothyroidism, GERD/peptic ulcer disease, allergic rhinitis, atrial fibrillation, tremor, coronary artery diseasewho was admitted to [facility] on 07/20/2017 for Hyponatremia    1. Hyponatremia: history and examination consistent with euvolemic hyponatremia.  - Continue fluid restriction 1281mL - Discontinue tolvaptan  - Na tablets.   2. Hypertension with atrial fibrillation: blood pressure at goal - Continue metoprolol. Do not recommend diuretics currently.   3. Coronary Artery Disease: hold atorvastatin, have discussed with cardiology.   4. Acute exacerbation of COPD: continue supportive  care - oxygen - systemic steroids.    LOS: [place], [place] 8/18/201812:33 PM

## 2017-07-25 NOTE — Progress Notes (Signed)
Family stays with pt at long intervals/ participated in pt care. PT advised tried to get pt OOB to chair; but unable too. PT reported right hip pain and decreased use of left leg. HOH with hearing aide in right ear. Tolerated diet-eats small amounts with diet modification to chop meats requested. Denies co's to nurse. Alert with forgetfulness, limited recall, did not verbalize correct relationship of dgt to him. NA+ 128-129. Nephro consult done. Ducolax tab given for constipation- will continue to assess for response.  VSS

## 2017-07-26 ENCOUNTER — Inpatient Hospital Stay: Payer: Medicare Other

## 2017-07-26 LAB — BASIC METABOLIC PANEL
ANION GAP: 7 (ref 5–15)
BUN: 15 mg/dL (ref 6–20)
CHLORIDE: 100 mmol/L — AB (ref 101–111)
CO2: 26 mmol/L (ref 22–32)
Calcium: 8.7 mg/dL — ABNORMAL LOW (ref 8.9–10.3)
Creatinine, Ser: 0.91 mg/dL (ref 0.61–1.24)
GFR calc non Af Amer: >60 mL/min (ref >60)
GLUCOSE: 95 mg/dL (ref 65–99)
POTASSIUM: 3.8 mmol/L (ref 3.5–5.1)
Sodium: 133 mmol/L — ABNORMAL LOW (ref 135–145)

## 2017-07-26 LAB — GLUCOSE, CAPILLARY: Glucose-Capillary: 102 mg/dL — ABNORMAL HIGH (ref 65–99)

## 2017-07-26 MED ORDER — PREDNISONE 50 MG PO TABS: 50.0000 mg | ORAL_TABLET | Freq: Every day | ORAL | 0 refills | Status: AC

## 2017-07-26 MED ORDER — BISACODYL 10 MG RE SUPP
10.0000 mg | Freq: Once | RECTAL | Status: AC
  Administered 2017-07-26: 09:00:00 10 mg via RECTAL
  Filled 2017-07-26: qty 1

## 2017-07-26 MED ORDER — LACTULOSE 10 GM/15ML PO SOLN
30.0000 g | Freq: Once | ORAL | Status: AC
  Administered 2017-07-26: 30 g via ORAL
  Filled 2017-07-26: qty 60

## 2017-07-26 MED ORDER — DOCUSATE SODIUM 100 MG PO CAPS: 100.0000 mg | ORAL_CAPSULE | Freq: Two times a day (BID) | ORAL | 0 refills | Status: AC

## 2017-07-26 MED ORDER — SENNA 8.6 MG PO TABS
1.0000 | ORAL_TABLET | Freq: Every day | ORAL | Status: DC
  Administered 2017-07-26: 8.6 mg via ORAL
  Filled 2017-07-26: qty 1

## 2017-07-26 MED ORDER — POLYETHYLENE GLYCOL 3350 17 G PO PACK
17.0000 g | PACK | Freq: Every day | ORAL | Status: DC
  Administered 2017-07-26: 17 g via ORAL
  Filled 2017-07-26: qty 1

## 2017-07-26 MED ORDER — IPRATROPIUM-ALBUTEROL 0.5-2.5 (3) MG/3ML IN SOLN: 3.0000 mL | Freq: Two times a day (BID) | RESPIRATORY_TRACT | Status: DC

## 2017-07-26 MED ORDER — IPRATROPIUM-ALBUTEROL 0.5-2.5 (3) MG/3ML IN SOLN: 3.0000 mL | Freq: Four times a day (QID) | RESPIRATORY_TRACT | 0 refills | Status: AC | PRN

## 2017-07-26 NOTE — Progress Notes (Signed)
EMS here to transport pt to [facility]. Family at bedside.  Discharge packet including yellow DNR form and AVS sent with EMS with oral education done with son. Discharge to SNF via ambulance.

## 2017-07-26 NOTE — Progress Notes (Signed)
[facility]  ROUNDING NOTE   Subjective:   Daughter and son at bedside.   Na 133 (129)  Going to [facility] today.   Objective:  Vital signs in last 24 hours:  Temp:  [97.8 F (36.6 C)-98.3 F (36.8 C)] 98.3 F (36.8 C) (08/19 1307) Pulse Rate:  [75-85] 81 (08/19 1307) Resp:  [20-23] 20 (08/19 0742) BP: (120-149)/(48-65) 126/48 (08/19 1307) SpO2:  [92 %-94 %] 93 % (08/19 1307) Weight:  [86.6 kg (191 lb)] 86.6 kg (191 lb) (08/19 0500)  Weight change: -0.907 kg (-2 lb) Filed Weights   07/24/17 0500 07/25/17 0432 07/26/17 0500  Weight: 89.4 kg (197 lb 3 oz) 87.5 kg (193 lb) 86.6 kg (191 lb)    Intake/Output: I/O last 3 completed shifts: In: 240 [P.O.:240] Out: -    Intake/Output this shift:  Total I/O In: 240 [P.O.:240] Out: -   Physical Exam: General: NAD, laying in bed  Head: Normocephalic, atraumatic. Moist oral mucosal membranes  Eyes: Anicteric, PERRL  Neck: Supple, trachea midline  Lungs:  clear  Heart: Regular rate and rhythm  Abdomen:  Soft, nontender,   Extremities: no peripheral edema.  Neurologic: Alert to self and place  Skin: No lesions       Basic Metabolic Panel:  Recent Labs Lab 07/21/17 0533  07/22/17 0337  07/23/17 0346  07/24/17 2141 07/25/17 0319 07/25/17 0944 07/25/17 1455 07/26/17 0350  NA 115*  < > 114*  < > 117*  < > 126* 128* 128* 129* 133*  K 4.0  --  4.0  --  4.4  --   --  4.4  --   --  3.8  CL 87*  --  87*  --  90*  --   --  96*  --   --  100*  CO2 22  --  18*  --  21*  --   --  25  --   --  26  GLUCOSE 87  --  164*  --  107*  --   --  121*  --   --  95  BUN 8  --  8  --  9  --   --  11  --   --  15  CREATININE 0.61  --  0.70  --  0.71  --   --  0.82  --   --  0.91  CALCIUM 7.5*  --  7.8*  --  8.1*  --   --  8.7*  --   --  8.7*  < > = values in this interval not displayed.  Liver Function Tests:  Recent Labs Lab 07/20/17 1134  AST 65*  ALT 67*  ALKPHOS 201*  BILITOT 1.8*  PROT 6.2*  ALBUMIN  3.4*   No results for input(s): LIPASE, AMYLASE in the last 168 hours. No results for input(s): AMMONIA in the last 168 hours.  CBC:  Recent Labs Lab 07/20/17 1134 07/21/17 0533 07/25/17 0319  WBC 6.6 6.6 9.5  HGB 13.6 13.2 14.3  HCT 39.5* 38.4* 41.1  MCV 92.0 91.2 92.1  PLT 208 189 216    Cardiac Enzymes:  Recent Labs Lab 07/20/17 1134  CKTOTAL 59  TROPONINI <0.03    BNP: Invalid input(s): POCBNP  CBG:  Recent Labs Lab 07/22/17 1626 07/23/17 0726 07/24/17 0754 07/25/17 0805 07/26/17 0758  GLUCAP 193* 97 89 115* 102*    Microbiology: Results for orders placed or performed during the hospital encounter of 07/20/17  Blood culture (routine x 2)     Status: None   Collection Time: 07/20/17  2:15 PM  Result Value Ref Range Status   Specimen Description BLOOD  LEFT AC   Final   Special Requests   Final    BOTTLES DRAWN AEROBIC AND ANAEROBIC Blood Culture adequate volume   Culture NO GROWTH 5 DAYS  Final   Report Status 07/25/2017 FINAL  Final  Blood culture (routine x 2)     Status: None   Collection Time: 07/20/17  2:15 PM  Result Value Ref Range Status   Specimen Description BLOOD RIGHT WRIST   Final   Special Requests   Final    BOTTLES DRAWN AEROBIC AND ANAEROBIC Blood Culture adequate volume   Culture NO GROWTH 5 DAYS  Final   Report Status 07/25/2017 FINAL  Final    Coagulation Studies: No results for input(s): LABPROT, INR in the last 72 hours.  Urinalysis: No results for input(s): COLORURINE, LABSPEC, PHURINE, GLUCOSEU, HGBUR, BILIRUBINUR, KETONESUR, PROTEINUR, UROBILINOGEN, NITRITE, LEUKOCYTESUR in the last 72 hours.  Invalid input(s): APPERANCEUR    Imaging: Dg Hip Unilat With Pelvis 2-3 Views Right  Result Date: 07/26/2017 CLINICAL DATA:  Family reports pt fell at home 1 week ago Sunday. PT reports right hip pain on standing attempt and c/o to family of right hip/groin pain. EXAM: DG HIP (WITH OR WITHOUT PELVIS) 2-3V RIGHT COMPARISON:   None. FINDINGS: No fracture.  No bone lesion. Left hip hemiarthroplasty appears well-seated and aligned. The right hip joint is normally spaced and aligned. No significant arthropathic change. SI joints and symphysis pubis are normally aligned. Bones are demineralized. Soft tissues are unremarkable. IMPRESSION: No fracture, dislocation or acute finding. Electronically Signed   By: Lajean Manes M.D.   On: 07/26/2017 09:31     Medications:    . docusate sodium  100 mg Oral BID  . enoxaparin (LOVENOX) injection  40 mg Subcutaneous Q24H  . ipratropium-albuterol  3 mL Inhalation BID  . levothyroxine  75 mcg Oral QAC breakfast  . methylPREDNISolone (SOLU-MEDROL) injection  40 mg Intravenous Daily  . metoprolol succinate  25 mg Oral Daily  . pantoprazole  40 mg Oral Daily  . polyethylene glycol  17 g Oral Daily  . senna  1 tablet Oral Daily  . sodium chloride flush  3 mL Intravenous Q12H  . sodium chloride  1 g Oral TID WC   acetaminophen **OR** acetaminophen, bisacodyl, ipratropium-albuterol, ondansetron **OR** ondansetron (ZOFRAN) IV  Assessment/ Plan:  Mr. VISHAAL STROLLO is a 83 y.o. white male Mr. FREELAND PRACHT is a 83 y.o. white male with COPD, hyperlipidemia, hypothyroidism, GERD/peptic ulcer disease, allergic rhinitis, atrial fibrillation, tremor, coronary artery diseasewho was admitted to [facility] on 07/20/2017 for Hyponatremia    1. Hyponatremia: history and examination consistent with euvolemic hyponatremia. Status post tolvaptan on 8/16, 8/17, 8/18.  - Continue fluid restriction 122mL - Continue Na tablets.   2. Hypertension with atrial fibrillation: blood pressure at goal - Continue metoprolol. Do not recommend diuretics currently.   3. Coronary Artery Disease: hold atorvastatin, have discussed with cardiology.   4. Acute exacerbation of COPD: continue supportive care - oxygen - systemic steroids, nebs    LOS: 6 Augustine Brannick 8/19/20181:40 PM

## 2017-07-26 NOTE — Progress Notes (Signed)
Pt being discharged to [facility], [facility]. Family and pt agreeable. No BM after bisacodyl tab/prune juice yesterday with no results.  Repeated bisacodyl this morning. MD notified with laxatives given with pt has had 2 stools with some loose and chunks with medium results thus far. VSS. Sodium up to 133. Family assists pt with feeding. Fluid restriction continued. VSS with no respiratory issues. Aspiration precautions were observed with meds/feeding. Report called to Ann at [facility] with pt accepted. EMS called for routine transport.

## 2017-07-26 NOTE — Discharge Summary (Addendum)
[place] at [place] NAME: Jeffrey Haynes    MR#:  [id number]  DATE OF BIRTH:  1925-02-19  DATE OF ADMISSION:  07/20/2017 ADMITTING PHYSICIAN: Max Sane, MD  DATE OF DISCHARGE: 07/26/2017  PRIMARY CARE PHYSICIAN: [name], MD    ADMISSION DIAGNOSIS:  Hyponatremia [E87.1] Weakness [R53.1] Community acquired pneumonia of left lung, unspecified part of lung [J18.9]  DISCHARGE DIAGNOSIS:  Active Problems:   Hyponatremia   Community acquired pneumonia of left lung   Weakness   Palliative care by specialist   Goals of care, counseling/discussion   SECONDARY DIAGNOSIS:   Past Medical History:  Diagnosis Date  . Bleeding ulcer 09/2012   a. gastric  . CAD (coronary artery disease)    a. Reported hx.  [name] COPD (chronic obstructive pulmonary disease) ([place])   . Essential hypertension   . Familial tremor   . Hyperlipidemia   . Hypothyroidism   . Orthostatic hypotension    a. leading to prior syncope  . PAF (paroxysmal atrial fibrillation) (HCC)    a. in the post-op setting and associated with COPD exacerbation-->amio 200 daily & eliquis 5 bid.    HOSPITAL COURSE:  83 year old male with a history of COPD who presented with shortness of breath and found to have severe hyponatremia and possible new lung mass.  1. Hyponatremia: This is thought to be euvolemic hyponatremia He was started in TOLVAPTAN as per nephrology consultation. This was discontinued yesterday. His sodium level with fluid restriction is 133 today. He should continue with 1300 cc fluid restriction and follow-up BMP in 3 days.  2. Acute hypoxic respiratory failure in the setting of Acute COPD exacerbation:  He has been transitioned from IV steroids to oral steroids to complete 3 more days. He has been weaned off of oxygen.  3. Community-acquired pneumonia: Patient has completed course with azithromycin and Rocephin. 4. Lung mass: As per pulmonary consultation  treat for community acquired pneumonia then will repeat CT scan in 4-6 weeks. He will have follow-up in 3-4 weeks 5. Hypothyroid: Continue Synthroid  6. Essential hypertension: Continue metoprolol 7. Hip pain after fall Hip XRAY was negative for fracture  DISCHARGE CONDITIONS AND DIET:   Stable for discharge on regular diet with 1314mL fluid restriction  CONSULTS OBTAINED:    DRUG ALLERGIES:  No Known Allergies  DISCHARGE MEDICATIONS:   Current Discharge Medication List    START taking these medications   Details  docusate sodium (COLACE) 100 MG capsule Take 1 capsule (100 mg total) by mouth 2 (two) times daily. Qty: 10 capsule, Refills: 0    ipratropium-albuterol (DUONEB) 0.5-2.5 (3) MG/3ML SOLN Inhale 3 mLs into the lungs every 6 (six) hours as needed. Qty: 360 mL, Refills: 0    predniSONE (DELTASONE) 50 MG tablet Take 1 tablet (50 mg total) by mouth daily with breakfast. Qty: 3 tablet, Refills: 0      CONTINUE these medications which have NOT CHANGED   Details  acetaminophen (TYLENOL) 500 MG tablet Take 500 mg by mouth every 6 (six) hours as needed.   Associated Diagnoses: Encounter for immunization    albuterol (PROVENTIL HFA;VENTOLIN HFA) 108 (90 Base) MCG/ACT inhaler Inhale 2 puffs into the lungs every 6 (six) hours as needed for wheezing or shortness of breath. Qty: 1 Inhaler, Refills: 2   Associated Diagnoses: Chronic bronchitis, unspecified chronic bronchitis type (HCC)    cetirizine (ZYRTEC) 10 MG tablet Take 10 mg by mouth daily.    Ipratropium-Albuterol (COMBIVENT)  20-100 MCG/ACT AERS respimat Inhale 2 puffs into the lungs 2 (two) times daily. Qty: 3 Inhaler, Refills: 4    levothyroxine (SYNTHROID, LEVOTHROID) 75 MCG tablet Take 1 tablet (75 mcg total) by mouth daily before breakfast. Qty: 90 tablet, Refills: 4   Associated Diagnoses: Hypothyroidism, unspecified type    metoprolol succinate (TOPROL-XL) 25 MG 24 hr tablet Take 1 tablet (25 mg total) by  mouth daily. Qty: 90 tablet, Refills: 4   Associated Diagnoses: Coronary artery disease involving native coronary artery of native heart without angina pectoris    pantoprazole (PROTONIX) 40 MG tablet Take 1 tablet (40 mg total) by mouth daily. Qty: 30 tablet, Refills: 12      STOP taking these medications     atorvastatin (LIPITOR) 20 MG tablet      potassium chloride (K-DUR) 10 MEQ tablet           Today   CHIEF COMPLAINT:   No acute issues overnight.   VITAL SIGNS:  Blood pressure 139/64, pulse 85, temperature 97.9 F (36.6 C), resp. rate 20, height 6' (1.829 m), weight 86.6 kg (191 lb), SpO2 94 %.   REVIEW OF SYSTEMS:  Review of Systems  Constitutional: Positive for malaise/fatigue. Negative for chills and fever.  HENT: Negative.  Negative for ear discharge, ear pain, hearing loss, nosebleeds and sore throat.   Eyes: Negative.  Negative for blurred vision and pain.  Respiratory: Negative.  Negative for cough, hemoptysis, shortness of breath and wheezing.   Cardiovascular: Negative.  Negative for chest pain, palpitations and leg swelling.  Gastrointestinal: Negative.  Negative for abdominal pain, blood in stool, diarrhea, nausea and vomiting.  Genitourinary: Negative.  Negative for dysuria.  Musculoskeletal: Negative.  Negative for back pain.  Skin: Negative.   Neurological: Positive for weakness. Negative for dizziness, tremors, speech change, focal weakness, seizures and headaches.  Endo/Heme/Allergies: Negative.  Does not bruise/bleed easily.  Psychiatric/Behavioral: Negative.  Negative for depression, hallucinations and suicidal ideas.     PHYSICAL EXAMINATION:  GENERAL:  83 y.o.-year-old patient lying in the bed with no acute distress.  NECK:  Supple, no jugular venous distention. No thyroid enlargement, no tenderness.  LUNGS: Normal breath sounds bilaterally, no wheezing, rales,rhonchi  No use of accessory muscles of respiration.  CARDIOVASCULAR: S1, S2  normal. No murmurs, rubs, or gallops.  ABDOMEN: Soft, non-tender, non-distended. Bowel sounds present. No organomegaly or mass.  EXTREMITIES: No pedal edema, cyanosis, or clubbing.  PSYCHIATRIC: The patient is alert and oriented x 3.  SKIN: No obvious rash, lesion, or ulcer.   DATA REVIEW:   CBC  Recent Labs Lab 07/25/17 0319  WBC 9.5  HGB 14.3  HCT 41.1  PLT 216    Chemistries   Recent Labs Lab 07/20/17 1134  07/26/17 0350  NA 114*  < > 133*  K 5.2*  < > 3.8  CL 85*  < > 100*  CO2 22  < > 26  GLUCOSE 86  < > 95  BUN 11  < > 15  CREATININE 0.80  < > 0.91  CALCIUM 8.4*  < > 8.7*  AST 65*  --   --   ALT 67*  --   --   ALKPHOS 201*  --   --   BILITOT 1.8*  --   --   < > = values in this interval not displayed.  Cardiac Enzymes  Recent Labs Lab 07/20/17 1134  TROPONINI <0.03    Microbiology Results  @[email or web address] @  RADIOLOGY:  Dg  Hip Unilat With Pelvis 2-3 Views Right  Result Date: 07/26/2017 CLINICAL DATA:  Family reports pt fell at home 1 week ago Sunday. PT reports right hip pain on standing attempt and c/o to family of right hip/groin pain. EXAM: DG HIP (WITH OR WITHOUT PELVIS) 2-3V RIGHT COMPARISON:  None. FINDINGS: No fracture.  No bone lesion. Left hip hemiarthroplasty appears well-seated and aligned. The right hip joint is normally spaced and aligned. No significant arthropathic change. SI joints and symphysis pubis are normally aligned. Bones are demineralized. Soft tissues are unremarkable. IMPRESSION: No fracture, dislocation or acute finding. Electronically Signed   By: Lajean Manes M.D.   On: 07/26/2017 09:31      Current Discharge Medication List    START taking these medications   Details  docusate sodium (COLACE) 100 MG capsule Take 1 capsule (100 mg total) by mouth 2 (two) times daily. Qty: 10 capsule, Refills: 0    ipratropium-albuterol (DUONEB) 0.5-2.5 (3) MG/3ML SOLN Inhale 3 mLs into the lungs every 6 (six) hours as needed. Qty: 360  mL, Refills: 0    predniSONE (DELTASONE) 50 MG tablet Take 1 tablet (50 mg total) by mouth daily with breakfast. Qty: 3 tablet, Refills: 0      CONTINUE these medications which have NOT CHANGED   Details  acetaminophen (TYLENOL) 500 MG tablet Take 500 mg by mouth every 6 (six) hours as needed.   Associated Diagnoses: Encounter for immunization    albuterol (PROVENTIL HFA;VENTOLIN HFA) 108 (90 Base) MCG/ACT inhaler Inhale 2 puffs into the lungs every 6 (six) hours as needed for wheezing or shortness of breath. Qty: 1 Inhaler, Refills: 2   Associated Diagnoses: Chronic bronchitis, unspecified chronic bronchitis type (HCC)    cetirizine (ZYRTEC) 10 MG tablet Take 10 mg by mouth daily.    Ipratropium-Albuterol (COMBIVENT) 20-100 MCG/ACT AERS respimat Inhale 2 puffs into the lungs 2 (two) times daily. Qty: 3 Inhaler, Refills: 4    levothyroxine (SYNTHROID, LEVOTHROID) 75 MCG tablet Take 1 tablet (75 mcg total) by mouth daily before breakfast. Qty: 90 tablet, Refills: 4   Associated Diagnoses: Hypothyroidism, unspecified type    metoprolol succinate (TOPROL-XL) 25 MG 24 hr tablet Take 1 tablet (25 mg total) by mouth daily. Qty: 90 tablet, Refills: 4   Associated Diagnoses: Coronary artery disease involving native coronary artery of native heart without angina pectoris    pantoprazole (PROTONIX) 40 MG tablet Take 1 tablet (40 mg total) by mouth daily. Qty: 30 tablet, Refills: 12      STOP taking these medications     atorvastatin (LIPITOR) 20 MG tablet      potassium chloride (K-DUR) 10 MEQ tablet           Management plans discussed with the patient and he is in agreement. Stable for discharge snf  Patient should follow up with pcp  CODE STATUS:     Code Status Orders        Start     Ordered   07/20/17 1630  Do not attempt resuscitation (DNR)  Continuous    Question Answer Comment  In the event of cardiac or respiratory ARREST Do not call a "code blue"   In the  event of cardiac or respiratory ARREST Do not perform Intubation, CPR, defibrillation or ACLS   In the event of cardiac or respiratory ARREST Use medication by any route, position, wound care, and other measures to relive pain and suffering. May use oxygen, suction and manual treatment of airway obstruction  as needed for comfort.      07/20/17 1629    Code Status History    Date Active Date Inactive Code Status Order ID Comments User Context   05/03/2016 12:14 AM 05/13/2016  4:16 PM Full Code [id number]  Fritzi Mandes, MD Inpatient      TOTAL TIME TAKING CARE OF THIS PATIENT: 38 minutes.    Note: This dictation was prepared with Dragon dictation along with smaller phrase technology. Any transcriptional errors that result from this process are unintentional.  Romir Klimowicz M.D on 07/26/2017 at 9:43 AM  Between 7am to 6pm - Pager - [phone] After 6pm go to [email or web address] - password [email or web address] [facility]  Office  [phone]  CC: Primary care physician; [name], MD

## 2017-07-26 NOTE — Clinical Social Work Note (Signed)
Patient will discharge to [facility] via non-emergent EMS pending BM and X-ray of right leg. [facility] and family are aware and in agreement. The packet has been delivered to the chart and all documentation sent to the facility. CSW will continue to follow pending additional discharge needs.  Santiago Bumpers, MSW, Latanya Presser [phone]

## 2017-08-03 ENCOUNTER — Other Ambulatory Visit
Admission: RE | Admit: 2017-08-03 | Discharge: 2017-08-03 | Disposition: A | Discharge status: Home / Self Care | Payer: Medicare Other | Source: Ambulatory Visit | Attending: Family Medicine | Admitting: Family Medicine

## 2017-08-03 DIAGNOSIS — I4891 Unspecified atrial fibrillation: Secondary | ICD-10-CM | POA: Diagnosis present

## 2017-08-03 DIAGNOSIS — M12059 Chronic postrheumatic arthropathy [Jaccoud], unspecified hip: Secondary | ICD-10-CM | POA: Insufficient documentation

## 2017-08-03 DIAGNOSIS — J441 Chronic obstructive pulmonary disease with (acute) exacerbation: Secondary | ICD-10-CM | POA: Diagnosis present

## 2017-08-03 LAB — AMMONIA: Ammonia: 61 umol/L — ABNORMAL HIGH (ref 9–35)

## 2017-08-05 ENCOUNTER — Telehealth: Payer: Self-pay | Admitting: Internal Medicine

## 2017-08-05 ENCOUNTER — Encounter: Payer: Self-pay | Admitting: Internal Medicine

## 2017-08-05 ENCOUNTER — Other Ambulatory Visit
Admission: RE | Admit: 2017-08-05 | Discharge: 2017-08-05 | Disposition: A | Discharge status: Home / Self Care | Payer: Medicare Other | Source: Ambulatory Visit | Attending: Family Medicine | Admitting: Family Medicine

## 2017-08-05 DIAGNOSIS — R531 Weakness: Secondary | ICD-10-CM | POA: Insufficient documentation

## 2017-08-05 LAB — CBC WITH DIFFERENTIAL/PLATELET
BASOS ABS: 0 10*3/uL (ref 0–0.1)
BASOS PCT: 0 %
EOS ABS: 0 10*3/uL (ref 0–0.7)
EOS PCT: 0 %
HCT: 44.7 % (ref 40.0–52.0)
Hemoglobin: 14.8 g/dL (ref 13.0–18.0)
LYMPHS PCT: 7 %
Lymphs Abs: 1.1 10*3/uL (ref 1.0–3.6)
MCH: 31.4 pg (ref 26.0–34.0)
MCHC: 33.1 g/dL (ref 32.0–36.0)
MCV: 94.9 fL (ref 80.0–100.0)
MONO ABS: 1.1 10*3/uL — AB (ref 0.2–1.0)
Monocytes Relative: 7 %
Neutro Abs: 13.2 10*3/uL — ABNORMAL HIGH (ref 1.4–6.5)
Neutrophils Relative %: 86 %
PLATELETS: 149 10*3/uL — AB (ref 150–440)
RBC: 4.7 MIL/uL (ref 4.40–5.90)
RDW: 15.7 % — AB (ref 11.5–14.5)
WBC: 15.4 10*3/uL — AB (ref 3.8–10.6)

## 2017-08-05 LAB — COMPREHENSIVE METABOLIC PANEL
ALT: 329 U/L — AB (ref 17–63)
AST: 298 U/L — ABNORMAL HIGH (ref 15–41)
Albumin: 2.3 g/dL — ABNORMAL LOW (ref 3.5–5.0)
Alkaline Phosphatase: 705 U/L — ABNORMAL HIGH (ref 38–126)
Anion gap: 9 (ref 5–15)
BUN: 49 mg/dL — ABNORMAL HIGH (ref 6–20)
CHLORIDE: 104 mmol/L (ref 101–111)
CO2: 25 mmol/L (ref 22–32)
CREATININE: 1.05 mg/dL (ref 0.61–1.24)
Calcium: 8.8 mg/dL — ABNORMAL LOW (ref 8.9–10.3)
GFR calc Af Amer: >60 mL/min (ref >60)
Glucose, Bld: 86 mg/dL (ref 65–99)
Potassium: 4.5 mmol/L (ref 3.5–5.1)
SODIUM: 138 mmol/L (ref 135–145)
Total Bilirubin: 8.2 mg/dL — ABNORMAL HIGH (ref 0.3–1.2)
Total Protein: 5 g/dL — ABNORMAL LOW (ref 6.5–8.1)

## 2017-08-05 LAB — AMMONIA: AMMONIA: 87 umol/L — AB (ref 9–35)

## 2017-08-05 NOTE — Telephone Encounter (Signed)
We have never seen this pt in office. He is scheduled to see you in October. Would like for me to move his appt up any sooner?

## 2017-08-05 NOTE — Telephone Encounter (Signed)
Jeffrey Haynes from [facility] calling stating they did ultra sound and they found possible liver mets  They are wanting [place] to be aware and are sending over results  Just wanted [place] to be aware

## 2017-08-06 NOTE — Telephone Encounter (Signed)
Not necessarily but since I don't know anything about him it's difficult to say. Recommend that they forward the results to his PCP.

## 2017-08-06 NOTE — Telephone Encounter (Signed)
LMOVM for Jeffrey Haynes to return call.

## 2017-08-07 ENCOUNTER — Encounter: Payer: Self-pay | Admitting: Emergency Medicine

## 2017-08-07 ENCOUNTER — Emergency Department: Payer: Medicare Other

## 2017-08-07 ENCOUNTER — Inpatient Hospital Stay
Admission: EM | Admit: 2017-08-07 | Discharge: 2017-09-07 | DRG: 682 | Disposition: E | Payer: Medicare Other | Attending: Internal Medicine | Admitting: Internal Medicine

## 2017-08-07 DIAGNOSIS — R4020 Unspecified coma: Secondary | ICD-10-CM | POA: Diagnosis present

## 2017-08-07 DIAGNOSIS — J449 Chronic obstructive pulmonary disease, unspecified: Secondary | ICD-10-CM | POA: Diagnosis present

## 2017-08-07 DIAGNOSIS — R627 Adult failure to thrive: Secondary | ICD-10-CM | POA: Diagnosis present

## 2017-08-07 DIAGNOSIS — Z8711 Personal history of peptic ulcer disease: Secondary | ICD-10-CM

## 2017-08-07 DIAGNOSIS — I10 Essential (primary) hypertension: Secondary | ICD-10-CM | POA: Diagnosis present

## 2017-08-07 DIAGNOSIS — I2510 Atherosclerotic heart disease of native coronary artery without angina pectoris: Secondary | ICD-10-CM | POA: Diagnosis present

## 2017-08-07 DIAGNOSIS — E785 Hyperlipidemia, unspecified: Secondary | ICD-10-CM | POA: Diagnosis present

## 2017-08-07 DIAGNOSIS — R7989 Other specified abnormal findings of blood chemistry: Secondary | ICD-10-CM

## 2017-08-07 DIAGNOSIS — E875 Hyperkalemia: Secondary | ICD-10-CM | POA: Diagnosis present

## 2017-08-07 DIAGNOSIS — C3412 Malignant neoplasm of upper lobe, left bronchus or lung: Secondary | ICD-10-CM | POA: Diagnosis present

## 2017-08-07 DIAGNOSIS — E039 Hypothyroidism, unspecified: Secondary | ICD-10-CM | POA: Diagnosis present

## 2017-08-07 DIAGNOSIS — Z66 Do not resuscitate: Secondary | ICD-10-CM | POA: Diagnosis present

## 2017-08-07 DIAGNOSIS — I248 Other forms of acute ischemic heart disease: Secondary | ICD-10-CM | POA: Diagnosis present

## 2017-08-07 DIAGNOSIS — C787 Secondary malignant neoplasm of liver and intrahepatic bile duct: Secondary | ICD-10-CM | POA: Diagnosis present

## 2017-08-07 DIAGNOSIS — F0390 Unspecified dementia without behavioral disturbance: Secondary | ICD-10-CM | POA: Diagnosis present

## 2017-08-07 DIAGNOSIS — Z87891 Personal history of nicotine dependence: Secondary | ICD-10-CM | POA: Diagnosis not present

## 2017-08-07 DIAGNOSIS — G9341 Metabolic encephalopathy: Secondary | ICD-10-CM | POA: Diagnosis present

## 2017-08-07 DIAGNOSIS — N179 Acute kidney failure, unspecified: Secondary | ICD-10-CM | POA: Diagnosis present

## 2017-08-07 DIAGNOSIS — R778 Other specified abnormalities of plasma proteins: Secondary | ICD-10-CM

## 2017-08-07 DIAGNOSIS — R748 Abnormal levels of other serum enzymes: Secondary | ICD-10-CM | POA: Diagnosis not present

## 2017-08-07 DIAGNOSIS — Z96642 Presence of left artificial hip joint: Secondary | ICD-10-CM | POA: Diagnosis present

## 2017-08-07 DIAGNOSIS — Z79899 Other long term (current) drug therapy: Secondary | ICD-10-CM | POA: Diagnosis not present

## 2017-08-07 DIAGNOSIS — E860 Dehydration: Secondary | ICD-10-CM | POA: Diagnosis present

## 2017-08-07 DIAGNOSIS — R945 Abnormal results of liver function studies: Secondary | ICD-10-CM

## 2017-08-07 DIAGNOSIS — Z515 Encounter for palliative care: Secondary | ICD-10-CM | POA: Diagnosis not present

## 2017-08-07 DIAGNOSIS — R4182 Altered mental status, unspecified: Secondary | ICD-10-CM | POA: Diagnosis present

## 2017-08-07 LAB — URINALYSIS, COMPLETE (UACMP) WITH MICROSCOPIC
BACTERIA UA: NONE SEEN
BILIRUBIN URINE: NEGATIVE
Glucose, UA: NEGATIVE mg/dL
Hgb urine dipstick: NEGATIVE
KETONES UR: NEGATIVE mg/dL
LEUKOCYTES UA: NEGATIVE
NITRITE: NEGATIVE
Protein, ur: NEGATIVE mg/dL
Specific Gravity, Urine: 1.02 (ref 1.005–1.030)
Squamous Epithelial / LPF: NONE SEEN
pH: 5 (ref 5.0–8.0)

## 2017-08-07 LAB — COMPREHENSIVE METABOLIC PANEL
ALBUMIN: 2.3 g/dL — AB (ref 3.5–5.0)
ALT: 331 U/L — ABNORMAL HIGH (ref 17–63)
AST: 419 U/L — AB (ref 15–41)
Alkaline Phosphatase: 793 U/L — ABNORMAL HIGH (ref 38–126)
Anion gap: 12 (ref 5–15)
BILIRUBIN TOTAL: 11.8 mg/dL — AB (ref 0.3–1.2)
BUN: 83 mg/dL — AB (ref 6–20)
CHLORIDE: 107 mmol/L (ref 101–111)
CO2: 21 mmol/L — ABNORMAL LOW (ref 22–32)
Calcium: 9 mg/dL (ref 8.9–10.3)
Creatinine, Ser: 1.62 mg/dL — ABNORMAL HIGH (ref 0.61–1.24)
GFR calc Af Amer: 41 mL/min — ABNORMAL LOW (ref >60)
GFR calc non Af Amer: 36 mL/min — ABNORMAL LOW (ref >60)
GLUCOSE: 110 mg/dL — AB (ref 65–99)
POTASSIUM: 4.5 mmol/L (ref 3.5–5.1)
Sodium: 140 mmol/L (ref 135–145)
Total Protein: 5.1 g/dL — ABNORMAL LOW (ref 6.5–8.1)

## 2017-08-07 LAB — CBC
HEMATOCRIT: 45.3 % (ref 40.0–52.0)
Hemoglobin: 15.5 g/dL (ref 13.0–18.0)
MCH: 31.8 pg (ref 26.0–34.0)
MCHC: 34.3 g/dL (ref 32.0–36.0)
MCV: 92.5 fL (ref 80.0–100.0)
PLATELETS: 144 10*3/uL — AB (ref 150–440)
RBC: 4.89 MIL/uL (ref 4.40–5.90)
RDW: 15.7 % — AB (ref 11.5–14.5)
WBC: 16.1 10*3/uL — AB (ref 3.8–10.6)

## 2017-08-07 LAB — LIPASE, BLOOD: Lipase: 47 U/L (ref 11–51)

## 2017-08-07 LAB — TROPONIN I
TROPONIN I: 0.1 ng/mL — AB (ref <0.03)
Troponin I: 0.08 ng/mL (ref <0.03)
Troponin I: 0.1 ng/mL (ref <0.03)

## 2017-08-07 MED ORDER — METOPROLOL SUCCINATE ER 25 MG PO TB24: 25.0000 mg | ORAL_TABLET | Freq: Every day | ORAL | Status: DC

## 2017-08-07 MED ORDER — SODIUM CHLORIDE 0.9 % IV SOLN
1000.0000 mL | Freq: Once | INTRAVENOUS | Status: AC
Start: 2017-08-07 — End: 2017-08-07
  Administered 2017-08-07: 1000 mL via INTRAVENOUS

## 2017-08-07 MED ORDER — ORAL CARE MOUTH RINSE
15.0000 mL | Freq: Two times a day (BID) | OROMUCOSAL | Status: DC
  Administered 2017-08-07: 23:00:00 15 mL via OROMUCOSAL

## 2017-08-07 MED ORDER — ENOXAPARIN SODIUM 30 MG/0.3ML ~~LOC~~ SOLN: 30.0000 mg | SUBCUTANEOUS | Status: DC

## 2017-08-07 MED ORDER — MOMETASONE FURO-FORMOTEROL FUM 200-5 MCG/ACT IN AERO
2.0000 | INHALATION_SPRAY | Freq: Two times a day (BID) | RESPIRATORY_TRACT | Status: DC
  Filled 2017-08-07: qty 8.8

## 2017-08-07 MED ORDER — IPRATROPIUM-ALBUTEROL 0.5-2.5 (3) MG/3ML IN SOLN
3.0000 mL | Freq: Four times a day (QID) | RESPIRATORY_TRACT | Status: DC
  Administered 2017-08-07 – 2017-08-08 (×2): 3 mL via RESPIRATORY_TRACT
  Filled 2017-08-07 (×2): qty 3

## 2017-08-07 MED ORDER — ONDANSETRON HCL 4 MG/2ML IJ SOLN: 4.0000 mg | Freq: Four times a day (QID) | INTRAMUSCULAR | Status: DC | PRN

## 2017-08-07 MED ORDER — IPRATROPIUM-ALBUTEROL 20-100 MCG/ACT IN AERS: 2.0000 | INHALATION_SPRAY | Freq: Two times a day (BID) | RESPIRATORY_TRACT | Status: DC

## 2017-08-07 MED ORDER — PANTOPRAZOLE SODIUM 40 MG IV SOLR
40.0000 mg | INTRAVENOUS | Status: DC
  Administered 2017-08-07: 17:00:00 40 mg via INTRAVENOUS
  Filled 2017-08-07: qty 40

## 2017-08-07 MED ORDER — ONDANSETRON HCL 4 MG PO TABS: 4.0000 mg | ORAL_TABLET | Freq: Four times a day (QID) | ORAL | Status: DC | PRN

## 2017-08-07 MED ORDER — IPRATROPIUM-ALBUTEROL 0.5-2.5 (3) MG/3ML IN SOLN: 3.0000 mL | Freq: Two times a day (BID) | RESPIRATORY_TRACT | Status: DC

## 2017-08-07 MED ORDER — CIPROFLOXACIN HCL 500 MG PO TABS: 500.0000 mg | ORAL_TABLET | Freq: Two times a day (BID) | ORAL | Status: DC

## 2017-08-07 MED ORDER — ALBUTEROL SULFATE (2.5 MG/3ML) 0.083% IN NEBU: 2.5000 mg | INHALATION_SOLUTION | Freq: Four times a day (QID) | RESPIRATORY_TRACT | Status: DC | PRN

## 2017-08-07 MED ORDER — LEVOFLOXACIN IN D5W 750 MG/150ML IV SOLN: 750.0000 mg | INTRAVENOUS | Status: DC

## 2017-08-07 MED ORDER — LEVOTHYROXINE SODIUM 100 MCG IV SOLR
37.5000 ug | Freq: Every day | INTRAVENOUS | Status: DC
  Filled 2017-08-07 (×2): qty 5

## 2017-08-07 MED ORDER — LACTULOSE 10 GM/15ML PO SOLN: 30.0000 g | Freq: Every day | ORAL | Status: DC

## 2017-08-07 MED ORDER — ALBUTEROL SULFATE HFA 108 (90 BASE) MCG/ACT IN AERS: 2.0000 | INHALATION_SPRAY | Freq: Four times a day (QID) | RESPIRATORY_TRACT | Status: DC | PRN

## 2017-08-07 MED ORDER — ENOXAPARIN SODIUM 40 MG/0.4ML ~~LOC~~ SOLN: 40.0000 mg | SUBCUTANEOUS | Status: DC

## 2017-08-07 MED ORDER — SODIUM CHLORIDE 0.9 % IV SOLN
INTRAVENOUS | Status: DC
  Administered 2017-08-07 – 2017-08-08 (×2): via INTRAVENOUS

## 2017-08-07 NOTE — H&P (Signed)
[facility] [place] NAME: Jeffrey Haynes    MR#:  [id number]  DATE OF BIRTH:  Jun 26, 1925  DATE OF ADMISSION:  07/29/2017  PRIMARY CARE PHYSICIAN: [name], MD   REQUESTING/REFERRING PHYSICIAN: Lavonia Drafts, MD  CHIEF COMPLAINT:  Poor po intake and abnml labs  HISTORY OF PRESENT ILLNESS:  Jeffrey Haynes  is a 83 y.o. male with a known history of COPD, essential hypertension, hyperlipidemia and multiple other medical problems was recently discharged from the hospital on August 19 th pneumonia after completing antibiotic course with Rocephin and azithromycin. Patient was diagnosed with a lung mass at that time and pulmonology has recommended repeat CT chestin 4-6 weeks after completing the antibiotic course. Chest x-ray today has revealed stable mass like area of consolidation in the peripheral left upper lobe.patient was unable to provide any history during my examination and lethargic. Son and daughter at bedside  PAST MEDICAL HISTORY:   Past Medical History:  Diagnosis Date  . Bleeding ulcer 09/2012   a. gastric  . CAD (coronary artery disease)    a. Reported hx.  [name] COPD (chronic obstructive pulmonary disease) ([place])   . Essential hypertension   . Familial tremor   . Hyperlipidemia   . Hypothyroidism   . Orthostatic hypotension    a. leading to prior syncope  . PAF (paroxysmal atrial fibrillation) (HCC)    a. in the post-op setting and associated with COPD exacerbation-->amio 200 daily & eliquis 5 bid.    PAST SURGICAL HISTOIRY:   Past Surgical History:  Procedure Laterality Date  . HERNIA REPAIR    . HIP ARTHROPLASTY Left 05/02/2016   Procedure: ARTHROPLASTY BIPOLAR HIP (HEMIARTHROPLASTY);  Surgeon: [name], MD;  Location: [facility] ORS;  Service: Orthopedics;  Laterality: Left;  . HIP SURGERY Left 05/02/2016  . SPINE SURGERY  1975   LS disk    SOCIAL HISTORY:   Social History  Substance Use Topics  . Smoking  status: Former Smoker    Types: Cigarettes    Quit date: 05/02/1978  . Smokeless tobacco: Former Systems developer    Types: [place] date: 10/02/2012  . Alcohol use No    FAMILY HISTORY:   Family History  Problem Relation Age of Onset  . Family history unknown: Yes    DRUG ALLERGIES:  No Known Allergies  REVIEW OF SYSTEMS:  ROS unobtainable  MEDICATIONS AT HOME:   Prior to Admission medications   Medication Sig Start Date End Date Taking? Authorizing Provider  cetirizine (ZYRTEC) 10 MG tablet Take 10 mg by mouth daily.   Yes [provider]  ciprofloxacin (CIPRO) 500 MG tablet Take 500 mg by mouth 2 (two) times daily. 08/05/17 08/14/17 Yes [provider]  docusate sodium (COLACE) 100 MG capsule Take 1 capsule (100 mg total) by mouth 2 (two) times daily. 07/26/17  Yes Mody, Ulice Bold, MD  Fluticasone-Salmeterol (ADVAIR) 250-50 MCG/DOSE AEPB Inhale 1 puff into the lungs 2 (two) times daily.   Yes [provider]  lactulose (CHRONULAC) 10 GM/15ML solution Take 30 g by mouth daily.   Yes [provider]  levothyroxine (SYNTHROID, LEVOTHROID) 75 MCG tablet Take 1 tablet (75 mcg total) by mouth daily before breakfast. 10/29/16  Yes Crissman, [name], MD  metoprolol succinate (TOPROL-XL) 25 MG 24 hr tablet Take 1 tablet (25 mg total) by mouth daily. 10/29/16  Yes Crissman, [name], MD  pantoprazole (PROTONIX) 40 MG tablet Take 1 tablet (  40 mg total) by mouth daily. 01/06/17  Yes Crissman, [name], MD  acetaminophen (TYLENOL) 500 MG tablet Take 500 mg by mouth every 6 (six) hours as needed.    [provider]  albuterol (PROVENTIL HFA;VENTOLIN HFA) 108 (90 Base) MCG/ACT inhaler Inhale 2 puffs into the lungs every 6 (six) hours as needed for wheezing or shortness of breath. 10/29/16   [name], MD  Ipratropium-Albuterol (COMBIVENT) 20-100 MCG/ACT AERS respimat Inhale 2 puffs into the lungs 2 (two) times daily. Patient taking differently: Inhale 2 puffs  into the lungs every 6 (six) hours as needed.  11/06/15   [name], MD  ipratropium-albuterol (DUONEB) 0.5-2.5 (3) MG/3ML SOLN Inhale 3 mLs into the lungs every 6 (six) hours as needed. 07/26/17   [name], MD      VITAL SIGNS:  Blood pressure 136/88, pulse (!) 117, temperature 97.8 F (36.6 C), temperature source Axillary, resp. rate (!) 24, height 6' (1.829 m), weight 83.6 kg (184 lb 4.9 oz), SpO2 94 %.  PHYSICAL EXAMINATION:  GENERAL:  83 y.o.-year-old patient lying in the bed with no acute distress.  cachectic EYES: Pupils equal, round, reactive to light and accommodation. No scleral icterus.  HEENT: Head atraumatic, normocephalic. Oropharynx and nasopharynx clear. Dry mucous membranes NECK:  Supple, no jugular venous distention. No thyroid enlargement, no tenderness.  LUNGS: mod breath sounds bilaterally, no wheezing, rales,rhonchi or crepitation. No use of accessory muscles of respiration.  CARDIOVASCULAR: S1, S2 normal. No murmurs, rubs, or gallops.  ABDOMEN: Soft, nontender, nondistended. Bowel sounds present. No organomegaly or mass.  EXTREMITIES: No pedal edema, cyanosis, or clubbing.  NEUROLOGIC: lethargic, arousable but falling asleep PSYCHIATRIC: The patient islethargic SKIN:dry. No obvious rash, lesion, or ulcer.   LABORATORY PANEL:   CBC  Recent Labs Lab 07/31/2017 0947  WBC 16.1*  HGB 15.5  HCT 45.3  PLT 144*   ------------------------------------------------------------------------------------------------------------------  Chemistries   Recent Labs Lab 07/14/2017 0947  NA 140  K 4.5  CL 107  CO2 21*  GLUCOSE 110*  BUN 83*  CREATININE 1.62*  CALCIUM 9.0  AST 419*  ALT 331*  ALKPHOS 793*  BILITOT 11.8*   ------------------------------------------------------------------------------------------------------------------  Cardiac Enzymes  Recent Labs Lab 07/26/2017 0947  TROPONINI 0.08*    ------------------------------------------------------------------------------------------------------------------  RADIOLOGY:  Dg Chest Portable 1 View  Result Date: 08/02/2017 CLINICAL DATA:  Pneumonia EXAM: PORTABLE CHEST 1 VIEW COMPARISON:  07/24/2017 FINDINGS: Consolidation/masslike area in the left upper lobe/ lingula again noted, stable. Diffuse interstitial prominence within the lungs, unchanged. Mild cardiomegaly. No visible effusions or acute bony abnormality. IMPRESSION: Stable masslike area of consolidation in the peripheral left upper lobe. Stable chronic interstitial changes. Electronically Signed   By: Rolm Baptise M.D.   On: 07/20/2017 10:11   US Abdomen Limited Ruq  Result Date: 08/03/2017 CLINICAL DATA:  Elevated liver function tests. EXAM: ULTRASOUND ABDOMEN LIMITED RIGHT UPPER QUADRANT COMPARISON:  CT scan of July 20, 2017 and October 02, 2012. FINDINGS: Gallbladder: Gallbladder is not visualized and presumably has been surgically resected. No sonographic Murphy's sign is noted. Common bile duct: Diameter: 2.8 mm which is within normal limits. Liver: No focal lesion identified. Slightly increased echogenicity of hepatic parenchyma is noted suggesting fatty infiltration or other diffuse hepatocellular disease. The portal vein is not demonstrated to be patent on Doppler, and portal venous thrombosis or occlusion cannot be excluded. IMPRESSION: Gallbladder is not visualized and presumably has been surgically resected. No biliary dilatation is noted. Slightly increased echogenicity of hepatic parenchyma  is noted suggesting fatty infiltration or other diffuse hepatocellular disease. Portal vein is not demonstrated to be patent on Doppler, and portal vein thrombosis or occlusion cannot be excluded. CT scan of the abdomen may be performed further evaluation. Electronically Signed   By: Marijo Conception, M.D.   On: 07/18/2017 11:54    EKG:   Orders placed or performed during the  hospital encounter of 07/12/2017  . ED EKG  . ED EKG  . EKG 12-Lead  . EKG 12-Lead    IMPRESSION AND PLAN:   Salaam Battershell  is a 83 y.o. male with a known history of COPD, essential hypertension, hyperlipidemia and multiple other medical problems was recently discharged from the hospital on August 19 th pneumonia after completing antibiotic course with Rocephin and azithromycin. Patient was diagnosed with a lung mass at that time and pulmonology has recommended repeat CT chestin 4-6 weeks after completing the antibiotic course. Chest x-ray today has revealed stable mass like area of consolidation in the peripheral left upper lobe  # acute encephalopathy -could be from AKI and failure to thrive admit to MedSurg unit Neuro checks IV fluids and supportive treatment   # AKI-from poor by mouth intake/dehydrated iV fluids Avoid nephrotoxins and monitor renal function  #Jaundice with elevated bilirubin at 11.8 with possible hepatocellular disease Provide IV fluids Repeat LFTs in a.m. Ultrasound did not reveal gallbladder and common bile duct is normal GI consult is placed PPI   # FTT Patient is failure to thrive with decreased by mouth intake since previous hospital admission, Patient was  discharged from the hospital with a pneumonia,discharged patient after completing antibiotic course with IV Rocephin and azithromycin Palliative care consult is placed  consult dietitian for calorie count once patient starts taking by mouth  #Stable left upper lobe mass Pulmonology has recommended to repeat CT chest in 6 weeks during previous admission, and patient was discharged from the hospital on August 19 during previous admission Consult pulmonology if needed  # Elevated troponin  Patient denies any chest pain according to the son and daughter at bedside Cycle troponins If necessary will consider cardiology consult  #hypothyroidism Patient takes by mouth levothyroxine 75 g we'll change it to  IV   #Essential hypertension Blood pressure is soft at this time from dehydration holding metoprolol   GI prophylaxis with Protonix    All the records are reviewed and case discussed with ED provider. Management plans discussed with the patient, family and they are in agreement.  CODE STATUS: DNR/ HCPOA -poa   TOTAL TIME TAKING CARE OF THIS PATIENT: 45  minutes.   Note: This dictation was prepared with Dragon dictation along with smaller phrase technology. Any transcriptional errors that result from this process are unintentional.  Nicholes Mango M.D on 07/08/2017 at 4:06 PM  Between 7am to 6pm - Pager - [phone]  After 6pm go to [email or web address] - password [email or web address] [facility]  Office  [phone]  CC: Primary care physician; [name], MD

## 2017-08-07 NOTE — Progress Notes (Signed)
Anticoagulation monitoring(Lovenox):  83yo  male ordered Lovenox 30 mg Q24h  Filed Weights   07/19/2017 0947  Weight: 184 lb 4.9 oz (83.6 kg)   Body mass index is 25 kg/m.    Lab Results  Component Value Date   CREATININE 1.62 (H) 07/18/2017   CREATININE 1.05 08/05/2017   CREATININE 0.91 07/26/2017   Estimated Creatinine Clearance: 33.3 mL/min (A) (by C-G formula based on SCr of 1.62 mg/dL (H)). Hemoglobin & Hematocrit     Component Value Date/Time   HGB 15.5 07/11/2017 0947   HGB 11.7 (L) 10/29/2016 1515   HCT 45.3 08/03/2017 0947   HCT 36.7 (L) 10/29/2016 1515     Per Protocol for Patient with estCrcl > 30 ml/min and BMI < 40, will transition to Lovenox 40 mg Q24h.

## 2017-08-07 NOTE — Telephone Encounter (Signed)
Informed Juliann Pulse of DR's response. She states she spoke with family this morning and they are going to send him to the hospital to be worked up. Nothing further needed.

## 2017-08-07 NOTE — ED Triage Notes (Signed)
Pt arrived via EMS from [facility] for evaluation of elevated LFT and decline in mentation. Pt daughter reports that pt has steadily declined over the last week. EMS reports 112/70, 99HR, 92-94% RA SpO2. Pt alert on arrival.

## 2017-08-07 NOTE — ED Notes (Signed)
TO [place] via stretcher.

## 2017-08-07 NOTE — Telephone Encounter (Signed)
LMOVM for Juliann Pulse to return call.

## 2017-08-07 NOTE — Progress Notes (Signed)
Pharmacy Antibiotic Note  Jeffrey Haynes is a 83 y.o. male admitted on 07/28/2017 with pneumonia (CAP).  Pharmacy has been consulted for levofloxacin dosing.  No H&P available at this time.   Plan: Levofloxacin 750 mg IV q48h based on current renal function.   Height: 6' (182.9 cm) Weight: 184 lb 4.9 oz (83.6 kg) IBW/kg (Calculated) : 77.6  Temp (24hrs), Avg:97.8 F (36.6 C), Min:97.8 F (36.6 C), Max:97.8 F (36.6 C)   Recent Labs Lab 08/05/17 1012 07/09/2017 0947  WBC 15.4* 16.1*  CREATININE 1.05 1.62*    Estimated Creatinine Clearance: 33.3 mL/min (A) (by C-G formula based on SCr of 1.62 mg/dL (H)).    No Known Allergies  Antimicrobials this admission: levaquin 8/31 >>  Dose adjustments this admission:   Microbiology results: 8/31 BCx: sent 8/31 UCx: sent   Thank you for allowing pharmacy to be a part of this patient's care.  Rocky Morel 07/18/2017 2:24 PM

## 2017-08-07 NOTE — ED Notes (Signed)
Pt daughter reports last week pt was much more alert, going to the grocery store, walking in the yard and had a good appetite. Pt daughter reports steady decline in pt activity level, appetite and pt reporting fatigue often.

## 2017-08-07 NOTE — ED Provider Notes (Signed)
[facility] Emergency Department Provider Note   ____________________________________________    I have reviewed the triage vital signs and the nursing notes.   HISTORY  Chief Complaint Abnormal Labs   Patient unable to provide any history  HPI Jeffrey Haynes is a 83 y.o. male who was reportedly sent from [facility] for elevated LFTs. No further history was provided.patient was discharged from the hospital on August 19 after treatment for hyponatremia and pneumonia. During that time liver tests appear grossly normal   Past Medical History:  Diagnosis Date  . Bleeding ulcer 09/2012   a. gastric  . CAD (coronary artery disease)    a. Reported hx.  [name] COPD (chronic obstructive pulmonary disease) ([place])   . Essential hypertension   . Familial tremor   . Hyperlipidemia   . Hypothyroidism   . Orthostatic hypotension    a. leading to prior syncope  . PAF (paroxysmal atrial fibrillation) (HCC)    a. in the post-op setting and associated with COPD exacerbation-->amio 200 daily & eliquis 5 bid.    Patient Active Problem List   Diagnosis Date Noted  . Community acquired pneumonia of left lung   . Weakness   . Palliative care by specialist   . Goals of care, counseling/discussion   . Hyponatremia 07/20/2017  . Decreased hearing of both ears 10/29/2016  . New onset atrial fibrillation ([place]) 05/06/2016  . Respiratory distress 05/06/2016  . Fall   . Status post hip hemiarthroplasty   . Femur fracture ([place]) 05/02/2016  . COPD (chronic obstructive pulmonary disease) ([place]) 11/06/2015  . CAD (coronary artery disease) 11/06/2015  . Hypothyroidism 11/06/2015  . Hyperlipidemia 11/06/2015  . Familial tremor 11/06/2015  . CKD (chronic kidney disease) stage 3, GFR 30-59 ml/min 11/06/2015    Past Surgical History:  Procedure Laterality Date  . HERNIA REPAIR    . HIP ARTHROPLASTY Left 05/02/2016   Procedure: ARTHROPLASTY BIPOLAR HIP (HEMIARTHROPLASTY);   Surgeon: [name], MD;  Location: [facility] ORS;  Service: Orthopedics;  Laterality: Left;  . HIP SURGERY Left 05/02/2016  . SPINE SURGERY  1975   LS disk    Prior to Admission medications   Medication Sig Start Date End Date Taking? Authorizing Provider  cetirizine (ZYRTEC) 10 MG tablet Take 10 mg by mouth daily.   Yes [provider]  ciprofloxacin (CIPRO) 500 MG tablet Take 500 mg by mouth 2 (two) times daily. 08/05/17 08/14/17 Yes [provider]  docusate sodium (COLACE) 100 MG capsule Take 1 capsule (100 mg total) by mouth 2 (two) times daily. 07/26/17  Yes Mody, Ulice Bold, MD  Fluticasone-Salmeterol (ADVAIR) 250-50 MCG/DOSE AEPB Inhale 1 puff into the lungs 2 (two) times daily.   Yes [provider]  lactulose (CHRONULAC) 10 GM/15ML solution Take 30 g by mouth daily.   Yes [provider]  levothyroxine (SYNTHROID, LEVOTHROID) 75 MCG tablet Take 1 tablet (75 mcg total) by mouth daily before breakfast. 10/29/16  Yes Crissman, [name], MD  metoprolol succinate (TOPROL-XL) 25 MG 24 hr tablet Take 1 tablet (25 mg total) by mouth daily. 10/29/16  Yes Crissman, [name], MD  pantoprazole (PROTONIX) 40 MG tablet Take 1 tablet (40 mg total) by mouth daily. 01/06/17  Yes Crissman, [name], MD  acetaminophen (TYLENOL) 500 MG tablet Take 500 mg by mouth every 6 (six) hours as needed.    [provider]  albuterol (PROVENTIL HFA;VENTOLIN HFA) 108 (90 Base) MCG/ACT inhaler Inhale 2 puffs into the  lungs every 6 (six) hours as needed for wheezing or shortness of breath. 10/29/16   [name], MD  Ipratropium-Albuterol (COMBIVENT) 20-100 MCG/ACT AERS respimat Inhale 2 puffs into the lungs 2 (two) times daily. Patient taking differently: Inhale 2 puffs into the lungs every 6 (six) hours as needed.  11/06/15   [name], MD  ipratropium-albuterol (DUONEB) 0.5-2.5 (3) MG/3ML SOLN Inhale 3 mLs into the lungs every 6 (six) hours as needed. 07/26/17   [name], MD       Allergies Patient has no known allergies.  Family History  Problem Relation Age of Onset  . Family history unknown: Yes    Social History Social History  Substance Use Topics  . Smoking status: Former Smoker    Types: Cigarettes    Quit date: 05/02/1978  . Smokeless tobacco: Former Systems developer    Types: [place] date: 10/02/2012  . Alcohol use No    Level V caveat: Unable to obtain Review of Systems due to altered mental status     ____________________________________________   PHYSICAL EXAM:  VITAL SIGNS: ED Triage Vitals  Enc Vitals Group     BP 08/04/2017 0946 108/65     Pulse Rate 07/11/2017 0946 100     Resp 07/28/2017 0946 (!) 24     Temp 07/21/2017 0946 97.8 F (36.6 C)     Temp Source 08/06/2017 0946 Axillary     SpO2 07/29/2017 0946 92 %     Weight 07/21/2017 0947 83.6 kg (184 lb 4.9 oz)     Height 08/06/2017 0947 1.829 m (6')     Head Circumference --      [facility] --      Pain Score --      Pain Loc --      Pain Edu? --      Excl. in Diagonal? --     Constitutional: Alert. No acute distress. Eyes: Conjunctivae are normal.   Nose: No congestion/rhinnorhea. Mouth/Throat: Mucous membranes are dry  Neck:  Painless ROM Cardiovascular: Normal rate, regular rhythm. Kermit Balo peripheral circulation. Respiratory: Normal respiratory effort.  No retractions. Lungs CTAB. Gastrointestinal: Soft and nontender. No distention.  No CVA tenderness. Genitourinary: deferred Musculoskeletal:   Warm and well perfused Neurologic:  Normal speech and language. No gross focal neurologic deficits are appreciated.  Skin:  Skin is warm, dry and intact. No rash noted. Psychiatric: Mood and affect are normal. Speech and behavior are normal.  ____________________________________________   LABS (all labs ordered are listed, but only abnormal results are displayed)  Labs Reviewed  CBC - Abnormal; Notable for the following:       Result Value   WBC 16.1 (*)    RDW 15.7 (*)    Platelets  144 (*)    All other components within normal limits  COMPREHENSIVE METABOLIC PANEL - Abnormal; Notable for the following:    CO2 21 (*)    Glucose, Bld 110 (*)    BUN 83 (*)    Creatinine, Ser 1.62 (*)    Total Protein 5.1 (*)    Albumin 2.3 (*)    AST 419 (*)    ALT 331 (*)    Alkaline Phosphatase 793 (*)    Total Bilirubin 11.8 (*)    GFR calc non Af Amer 36 (*)    GFR calc Af Amer 41 (*)    All other components within normal limits  TROPONIN I - Abnormal; Notable for the following:  Troponin I 0.08 (*)    All other components within normal limits  CULTURE, BLOOD (ROUTINE X 2)  CULTURE, BLOOD (ROUTINE X 2)  URINE CULTURE  LIPASE, BLOOD  URINALYSIS, COMPLETE (UACMP) WITH MICROSCOPIC   ____________________________________________  EKG  ED ECG REPORT I, Lavonia Drafts, the attending physician, personally viewed and interpreted this ECG.  Date: 07/22/2017  Rate: 116 Rhythm: sinus tachycardia QRS Axis: normal Intervals: normal ST/T Wave abnormalities: normal Narrative Interpretation: no evidence of acute ischemia  ____________________________________________  RADIOLOGY  Chest x-ray unremarkable Ultrasound ____________________________________________   PROCEDURES  Procedure(s) performed: No    Critical Care performed: No ____________________________________________   INITIAL IMPRESSION / ASSESSMENT AND PLAN / ED COURSE  Pertinent labs & imaging results that were available during my care of the patient were reviewed by me and considered in my medical decision making (see chart for details).  Patient presents with elevated LFTs. Apparently he is at his baseline mentally. We will confirm labs obtain ultrasound of liver and reevaluate  LFTs are significantly elevated. Ultrasound is overall unremarkable, I will admit the patient for further workup and management.    ____________________________________________   FINAL CLINICAL IMPRESSION(S) / ED  DIAGNOSES  Final diagnoses:  Elevated LFTs  Dehydration  Elevated troponin      NEW MEDICATIONS STARTED DURING THIS VISIT:  New Prescriptions   No medications on file     Note:  This document was prepared using Dragon voice recognition software and may include unintentional dictation errors.    Lavonia Drafts, MD 07/19/2017 [phone]

## 2017-08-07 NOTE — Progress Notes (Signed)
Palliative Medicine Team  Due to high volume of referrals, there is a delay seeing this patient. PMT not at [facility] over the weekend but will arrange goals of care with patient and family on Monday. If discharged over the weekend, may benefit from outpatient palliative referral for initial goals of care. Thank you for the opportunity to participate in the care of Mr. Fambro.   Ihor Dow, FNP-C Palliative Medicine Team  Phone: [phone] Fax: [phone]

## 2017-08-07 NOTE — ED Notes (Signed)
Date and time results received: 07/09/2017 10:53 AM  (use smartphrase ".now" to insert current time)  Test: troponin Critical Value: 0.08  Name of Provider Notified: Dr. Corky Downs  Orders Received? Or Actions Taken?: Orders Received - See Orders for details

## 2017-08-08 DIAGNOSIS — R748 Abnormal levels of other serum enzymes: Secondary | ICD-10-CM

## 2017-08-08 LAB — BASIC METABOLIC PANEL
ANION GAP: 17 — AB (ref 5–15)
BUN: 103 mg/dL — AB (ref 6–20)
CALCIUM: 8.7 mg/dL — AB (ref 8.9–10.3)
CO2: 15 mmol/L — AB (ref 22–32)
Chloride: 112 mmol/L — ABNORMAL HIGH (ref 101–111)
Creatinine, Ser: 2.8 mg/dL — ABNORMAL HIGH (ref 0.61–1.24)
GFR calc Af Amer: 21 mL/min — ABNORMAL LOW (ref >60)
GFR, EST NON AFRICAN AMERICAN: 18 mL/min — AB (ref >60)
GLUCOSE: 51 mg/dL — AB (ref 65–99)
Potassium: 6.4 mmol/L (ref 3.5–5.1)
Sodium: 144 mmol/L (ref 135–145)

## 2017-08-08 LAB — CBC
HEMATOCRIT: 45.8 % (ref 40.0–52.0)
Hemoglobin: 15.3 g/dL (ref 13.0–18.0)
MCH: 31.9 pg (ref 26.0–34.0)
MCHC: 33.4 g/dL (ref 32.0–36.0)
MCV: 95.6 fL (ref 80.0–100.0)
PLATELETS: 144 10*3/uL — AB (ref 150–440)
RBC: 4.8 MIL/uL (ref 4.40–5.90)
RDW: 16 % — AB (ref 11.5–14.5)
WBC: 15 10*3/uL — AB (ref 3.8–10.6)

## 2017-08-08 LAB — COMPREHENSIVE METABOLIC PANEL
ALBUMIN: 2.2 g/dL — AB (ref 3.5–5.0)
ALT: 402 U/L — ABNORMAL HIGH (ref 17–63)
ANION GAP: 17 — AB (ref 5–15)
AST: 579 U/L — AB (ref 15–41)
Alkaline Phosphatase: 747 U/L — ABNORMAL HIGH (ref 38–126)
BILIRUBIN TOTAL: 12.6 mg/dL — AB (ref 0.3–1.2)
BUN: 98 mg/dL — AB (ref 6–20)
CO2: 16 mmol/L — ABNORMAL LOW (ref 22–32)
Calcium: 8.7 mg/dL — ABNORMAL LOW (ref 8.9–10.3)
Chloride: 112 mmol/L — ABNORMAL HIGH (ref 101–111)
Creatinine, Ser: 2.56 mg/dL — ABNORMAL HIGH (ref 0.61–1.24)
GFR calc Af Amer: 24 mL/min — ABNORMAL LOW (ref >60)
GFR calc non Af Amer: 21 mL/min — ABNORMAL LOW (ref >60)
GLUCOSE: 64 mg/dL — AB (ref 65–99)
POTASSIUM: 5.8 mmol/L — AB (ref 3.5–5.1)
SODIUM: 145 mmol/L (ref 135–145)
TOTAL PROTEIN: 4.8 g/dL — AB (ref 6.5–8.1)

## 2017-08-08 LAB — TROPONIN I: TROPONIN I: 0.13 ng/mL — AB (ref <0.03)

## 2017-08-08 LAB — URINE CULTURE: CULTURE: NO GROWTH

## 2017-08-08 MED ORDER — MORPHINE SULFATE (PF) 2 MG/ML IV SOLN
2.0000 mg | INTRAVENOUS | Status: AC
  Administered 2017-08-08: 2 mg via INTRAVENOUS
  Filled 2017-08-08: qty 1

## 2017-08-08 MED ORDER — MORPHINE BOLUS VIA INFUSION: 5.0000 mg | INTRAVENOUS | Status: DC | PRN

## 2017-08-08 MED ORDER — LORAZEPAM BOLUS VIA INFUSION: 1.0000 mg | INTRAVENOUS | Status: DC | PRN

## 2017-08-08 MED ORDER — KETOROLAC TROMETHAMINE 15 MG/ML IJ SOLN
15.0000 mg | Freq: Four times a day (QID) | INTRAMUSCULAR | Status: DC | PRN
  Administered 2017-08-08: 15 mg via INTRAVENOUS
  Filled 2017-08-08 (×2): qty 1

## 2017-08-08 MED ORDER — LORAZEPAM 2 MG/ML IJ SOLN: 1.0000 mg | INTRAMUSCULAR | Status: DC | PRN

## 2017-08-08 MED ORDER — LORAZEPAM BOLUS VIA INFUSION: 2.0000 mg | INTRAVENOUS | Status: DC | PRN

## 2017-08-08 MED ORDER — FUROSEMIDE 10 MG/ML IJ SOLN: 40.0000 mg | Freq: Once | INTRAMUSCULAR | Status: DC

## 2017-08-08 MED ORDER — SODIUM POLYSTYRENE SULFONATE 15 GM/60ML PO SUSP: 15.0000 g | Freq: Once | ORAL | Status: DC

## 2017-08-08 MED ORDER — MORPHINE SULFATE (PF) 2 MG/ML IV SOLN
2.0000 mg | INTRAVENOUS | Status: DC | PRN
  Administered 2017-08-08: 11:00:00 2 mg via INTRAVENOUS
  Filled 2017-08-08: qty 1

## 2017-08-08 DEATH — deceased

## 2017-08-12 LAB — CULTURE, BLOOD (ROUTINE X 2)
CULTURE: NO GROWTH
Culture: NO GROWTH
Special Requests: ADEQUATE
Special Requests: ADEQUATE

## 2017-08-13 ENCOUNTER — Ambulatory Visit: Payer: Medicare Other | Admitting: Oncology

## 2017-09-07 NOTE — Progress Notes (Signed)
Dr. Manuella Ghazi indicated that the patient is now comfort care, therefore renal consult not needed.

## 2017-09-07 NOTE — Consult Note (Signed)
Cardiology Consultation:   Patient ID: Jeffrey Haynes; [id number]; 1925/08/26   Admit date: 07/13/2017 Date of Consult: Aug 19, 2017  Primary Care Provider: [name], MD Primary Cardiologist: None Primary Electrophysiologist:  None   Patient Profile:   Jeffrey Haynes is a 83 y.o. male with a hx of COPD , D[place]ia who is being seen today for the evaluation of elevated troponin at the request of DR Jeffrey Haynes.  History of Present Illness:   Jeffrey Haynes 83 y.o. admitted with change in MS History from daughter and son. Patient not verbal. Was doing fine until mid July has slowly had more malaise, fatigue , confusion and dyspnea Admitted yesterday with poor PO intake 8/19 Rx for ? Pneumonia with LLL lung mass. CT done  Yesterday suggested metastatic lung CA. PMH indicates history of CAD. Children don't indicate any active issues or routine f/u with cardiology He has not complained of any chest pain has had PAF in setting of COPD exacerbation Troponin only .08 to .13   Past Medical History:  Diagnosis Date  . Bleeding ulcer 09/2012   a. gastric  . CAD (coronary artery disease)    a. Reported hx.  [name] COPD (chronic obstructive pulmonary disease) ([place])   . Essential hypertension   . Familial tremor   . Hyperlipidemia   . Hypothyroidism   . Orthostatic hypotension    a. leading to prior syncope  . PAF (paroxysmal atrial fibrillation) ([facility])    a. in the post-op setting and associated with COPD exacerbation-->amio 200 daily & eliquis 5 bid.    Past Surgical History:  Procedure Laterality Date  . HERNIA REPAIR    . HIP ARTHROPLASTY Left 05/02/2016   Procedure: ARTHROPLASTY BIPOLAR HIP (HEMIARTHROPLASTY);  Surgeon: Jeffrey Mull, MD;  Location: [facility] ORS;  Service: Orthopedics;  Laterality: Left;  . HIP SURGERY Left 05/02/2016  . SPINE SURGERY  1975   LS disk     Home Medications:  Prior to Admission medications   Medication Sig Start Date End Date Taking? Authorizing Provider    cetirizine (ZYRTEC) 10 MG tablet Take 10 mg by mouth daily.   Yes [provider]  ciprofloxacin (CIPRO) 500 MG tablet Take 500 mg by mouth 2 (two) times daily. 08/05/17 08/14/17 Yes [provider]  docusate sodium (COLACE) 100 MG capsule Take 1 capsule (100 mg total) by mouth 2 (two) times daily. 07/26/17  Yes Haynes, Jeffrey Bold, MD  Fluticasone-Salmeterol (ADVAIR) 250-50 MCG/DOSE AEPB Inhale 1 puff into the lungs 2 (two) times daily.   Yes [provider]  lactulose (CHRONULAC) 10 GM/15ML solution Take 30 g by mouth daily.   Yes [provider]  levothyroxine (SYNTHROID, LEVOTHROID) 75 MCG tablet Take 1 tablet (75 mcg total) by mouth daily before breakfast. 10/29/16  Yes Haynes, Jeffrey How, MD  metoprolol succinate (TOPROL-XL) 25 MG 24 hr tablet Take 1 tablet (25 mg total) by mouth daily. 10/29/16  Yes Haynes, Jeffrey How, MD  pantoprazole (PROTONIX) 40 MG tablet Take 1 tablet (40 mg total) by mouth daily. 01/06/17  Yes Haynes, Jeffrey How, MD  acetaminophen (TYLENOL) 500 MG tablet Take 500 mg by mouth every 6 (six) hours as needed.    [provider]  albuterol (PROVENTIL HFA;VENTOLIN HFA) 108 (90 Base) MCG/ACT inhaler Inhale 2 puffs into the lungs every 6 (six) hours as needed for wheezing or shortness of breath. 10/29/16   Jeffrey Maple, MD  Ipratropium-Albuterol (COMBIVENT) 20-100 MCG/ACT AERS respimat Inhale 2 puffs into the lungs  2 (two) times daily. Patient taking differently: Inhale 2 puffs into the lungs every 6 (six) hours as needed.  11/06/15   Jeffrey Maple, MD  ipratropium-albuterol (DUONEB) 0.5-2.5 (3) MG/3ML SOLN Inhale 3 mLs into the lungs every 6 (six) hours as needed. 07/26/17   [name], MD    Inpatient Medications: Scheduled Meds: . levothyroxine  37.5 mcg Intravenous Daily  . mouth rinse  15 mL Mouth Rinse BID  . sodium polystyrene  15 g Oral Once   Continuous Infusions: . sodium chloride 100 mL/hr at August 18, 2017 0321   PRN  Meds: ketorolac, LORazepam, ondansetron **OR** ondansetron (ZOFRAN) IV  Allergies:   No Known Allergies  Social History:   Social History   Social History  . Marital status: Widowed    Spouse name: N/A  . Number of children: N/A  . Years of education: N/A   Occupational History  . Not on file.   Social History Main Topics  . Smoking status: Former Smoker    Types: Cigarettes    Quit date: 05/02/1978  . Smokeless tobacco: Former Systems developer    Types: Emison date: 10/02/2012  . Alcohol use No  . Drug use: No  . Sexual activity: Not on file   Other Topics Concern  . Not on file   Social History Narrative  . No narrative on file    Family History:    Family History  Problem Relation Age of Onset  . Family history unknown: Yes     ROS:  Please see the history of present illness.  ROS  All other ROS reviewed and negative.     Physical Exam/Data:   Vitals:   08/01/2017 1917 07/19/2017 2031 08-18-2017 0421 08/18/17 0814  BP:  (!) 102/47 (!) 107/51   Pulse:  92 83   Resp:  (!) 36 (!) 38   Temp:  98.1 F (36.7 C) 99.7 F (37.6 C)   TempSrc:  Axillary Axillary   SpO2: 92% 94% 95% 94%  Weight:      Height:        Intake/Output Summary (Last 24 hours) at 18-Aug-2017 0921 Last data filed at Aug 18, 2017 0556  Gross per 24 hour  Intake          2298.33 ml  Output                0 ml  Net          2298.33 ml   Filed Weights   07/24/2017 0947  Weight: 184 lb 4.9 oz (83.6 kg)   Body mass index is 25 kg/m.  General:  Thin frail elderly male  HEENT: normal Lymph: no adenopathy Neck: no JVD Endocrine:  No thryomegaly Vascular: No carotid bruits; FA pulses 2+ bilaterally without bruits  Cardiac:  normal S1, S2; RRR; SEM  Lungs:  Poor respiratory effort basilar crackles  Abd: soft, nontender, no hepatomegaly  Ext: no edema Musculoskeletal:  No deformities, BUE and BLE strength normal and equal Skin: warm and dry  Neuro:  Not arousable  Psych:  Normal affect   EKG:   The EKG was personally reviewed and demonstrates:  SR PAC;s nonspecific ST changes  Telemetry:  Telemetry was personally reviewed and demonstrates:  SR PAC;s SVT  Relevant CV Studies: Echo 05/06/16 EF 60-65%   Laboratory Data:  Chemistry Recent Labs Lab 07/18/2017 0947 08-18-2017 0432 2017-08-18 0733  NA 140 145 144  K 4.5 5.8* 6.4*  CL 107 112* 112*  CO2 21* 16* 15*  GLUCOSE 110* 64* 51*  BUN 83* 98* 103*  CREATININE 1.62* 2.56* 2.80*  CALCIUM 9.0 8.7* 8.7*  GFRNONAA 36* 21* 18*  GFRAA 41* 24* 21*  ANIONGAP 12 17* 17*     Recent Labs Lab 08/05/17 1012 07/31/2017 0947 2017/08/12 0432  PROT 5.0* 5.1* 4.8*  ALBUMIN 2.3* 2.3* 2.2*  AST 298* 419* 579*  ALT 329* 331* 402*  ALKPHOS 705* 793* 747*  BILITOT 8.2* 11.8* 12.6*   Hematology Recent Labs Lab 08/05/17 1012 07/19/2017 0947 2017/08/12 0432  WBC 15.4* 16.1* 15.0*  RBC 4.70 4.89 4.80  HGB 14.8 15.5 15.3  HCT 44.7 45.3 45.8  MCV 94.9 92.5 95.6  MCH 31.4 31.8 31.9  MCHC 33.1 34.3 33.4  RDW 15.7* 15.7* 16.0*  PLT 149* 144* 144*   Cardiac Enzymes Recent Labs Lab 07/31/2017 0947 07/12/2017 1651 07/08/2017 2228 2017/08/12 0432  TROPONINI 0.08* 0.10* 0.10* 0.13*   No results for input(s): TROPIPOC in the last 168 hours.  BNPNo results for input(s): BNP, PROBNP in the last 168 hours.  DDimer No results for input(s): DDIMER in the last 168 hours.  Radiology/Studies:  Dg Chest Portable 1 View  Result Date: 07/24/2017 CLINICAL DATA:  Pneumonia EXAM: PORTABLE CHEST 1 VIEW COMPARISON:  07/24/2017 FINDINGS: Consolidation/masslike area in the left upper lobe/ lingula again noted, stable. Diffuse interstitial prominence within the lungs, unchanged. Mild cardiomegaly. No visible effusions or acute bony abnormality. IMPRESSION: Stable masslike area of consolidation in the peripheral left upper lobe. Stable chronic interstitial changes. Electronically Signed   By: Rolm Baptise M.D.   On: 07/12/2017 10:11   US Abdomen Limited Ruq  Result  Date: 08/06/2017 CLINICAL DATA:  Elevated liver function tests. EXAM: ULTRASOUND ABDOMEN LIMITED RIGHT UPPER QUADRANT COMPARISON:  CT scan of July 20, 2017 and October 02, 2012. FINDINGS: Gallbladder: Gallbladder is not visualized and presumably has been surgically resected. No sonographic Murphy's sign is noted. Common bile duct: Diameter: 2.8 mm which is within normal limits. Liver: No focal lesion identified. Slightly increased echogenicity of hepatic parenchyma is noted suggesting fatty infiltration or other diffuse hepatocellular disease. The portal vein is not demonstrated to be patent on Doppler, and portal venous thrombosis or occlusion cannot be excluded. IMPRESSION: Gallbladder is not visualized and presumably has been surgically resected. No biliary dilatation is noted. Slightly increased echogenicity of hepatic parenchyma is noted suggesting fatty infiltration or other diffuse hepatocellular disease. Portal vein is not demonstrated to be patent on Doppler, and portal vein thrombosis or occlusion cannot be excluded. CT scan of the abdomen may be performed further evaluation. Electronically Signed   By: Marijo Conception, M.D.   On: 07/26/2017 11:54    Assessment and Plan:   1. Elevated troponin:  Doubt acute coronary syndrome no acute ECG changes no evolution no indication for further w/u in 83 yo patient with dementia and likely metastatic lung cancer 2. Mental status. Poor po intake with severe azotemia CT no mets on top of baseline dementia 3. Pumonary metastatic lung CA should have palliative care consult  Will sign off   Signed, Jenkins Rouge, MD  2017-08-12 9:21 AM

## 2017-09-07 NOTE — Plan of Care (Signed)
Pt died at 10:57 am.  Her daughter and the patient's chaplain was at the bedside.  We had just moved patient into the larger room and I had given 2 mg morphine.  Patient had been very tachypnic.  Dr. Max Sane was present at time of death and called time.  Chaplain, nurse supervisor and [facility] were all called.

## 2017-09-07 NOTE — Death Summary Note (Signed)
DEATH SUMMARY   Patient Details  Name: Jeffrey Haynes MRN: [id number] DOB: 22-Sep-1925  Admission/Discharge Information   Admit Date:  Aug 13, 2017  Date of Death:   08-14-17  Time of Death:  10:57 am  Length of Stay: 1  Referring Physician: [name], MD   Reason(s) for Hospitalization  Acute metabolic Encephalopathy  Diagnoses  Preliminary cause of death: Multiorgan Failure Lung mass with Suspected Lung cancer with mets (Family chose not to pursue any diagnostic work up for cancer and made him comfort care) Acute metabolic encephalopathy Acute renal failure Secondary Diagnoses (including complications and co-morbidities):  Active Problems:   Altered mental state COPD, essential hypertension, hyperlipidemia  Brief Hospital Course (including significant findings, care, treatment, and services provided and events leading to death)  Jeffrey Haynes a 83 y.o. malewith a known history of COPD, essential hypertension, hyperlipidemia and multiple other medical problems was recently discharged from the hospital on August 19 th pneumonia after completing antibiotic course with Rocephin and azithromycin. Patient was diagnosed with a lung mass at that time and pulmonology has recommended repeat CT chest in 4-6 weeks after completing the antibiotic course. Chest x-ray today has revealed stable mass like area of consolidation in the peripheral left upper lobe  # Acute Metabolic encephalopathy - multifactorial - from multiorgan failure  * Hyperkalemia  # ARF  #Jaundice with elevated bilirubin at 12.6 with possible hepatocellular disease - this could be due to mets from primary Lung CA  # FTT  #Stable left upper lobe mass - i'm highly concerned that this is lung cancer  # Elevated troponin  - due to demand ischemia, no MI  #hypothyroidism  #Essential hypertension Pertinent Labs and Studies  Significant Diagnostic Studies Dg Chest 1 View  Result Date:  07/24/2017 CLINICAL DATA:  Pneumonia. EXAM: CHEST 1 VIEW COMPARISON:  CT chest and chest x-ray dated July 20, 2017. FINDINGS: The cardiomediastinal silhouette is unchanged. Diffuse reticulonodular interstitial markings are similar to prior study. Unchanged peripheral opacity in the left mid lung, better evaluated on recent chest CT. Left basilar atelectasis, unchanged. Possible small left pleural effusion. No pneumothorax. No acute osseous abnormality. IMPRESSION: 1. Unchanged peripheral opacity in the left midlung, corresponding to the mass seen on CT. No new consolidation. 2. Possible new small left pleural effusion. 3. Pulmonary fibrosis, unchanged. Electronically Signed   By: Titus Dubin M.D.   On: 07/24/2017 08:59   Dg Chest 2 View  Result Date: 07/20/2017 CLINICAL DATA:  Shortness of breath. EXAM: CHEST  2 VIEW COMPARISON:  Chest x-ray dated May 02, 2016. FINDINGS: The cardiomediastinal silhouette is normal in size. Atherosclerotic calcification of the aortic arch. Diffuse reticulonodular interstitial markings, similar to prior study. New peripheral opacity in the left mid lung. Left basilar atelectasis. No pleural effusion or pneumothorax. No acute osseous abnormality. IMPRESSION: 1. New peripheral opacity in the left mid lung, which could represent pneumonia in the proper clinical setting. Followup PA and lateral chest X-ray is recommended in 3-4 weeks following trial of antibiotic therapy to ensure resolution and exclude underlying malignancy. 2. Similar appearing fibrotic changes within the lungs. Electronically Signed   By: Titus Dubin M.D.   On: 07/20/2017 11:41   Ct Head Wo Contrast  Result Date: 07/20/2017 CLINICAL DATA:  Fatigue and altered mental status EXAM: CT HEAD WITHOUT CONTRAST TECHNIQUE: Contiguous axial images were obtained from the base of the skull through the vertex without intravenous contrast. COMPARISON:  October 02, 2012 FINDINGS: Brain: There is mild diffuse atrophy.  Right lateral ventricle is larger than the left lateral ventricle, a stable finding which may represent an anatomic variant. There is no intracranial mass, hemorrhage, extra-axial fluid collection, or midline shift. There is patchy small vessel disease in the centra semiovale bilaterally. No acute infarct evident. Vascular: No hyperdense vessel. There are foci of calcification in each carotid artery. Skull: Bony calvarium appears intact. Sinuses/Orbits: There is mucosal thickening in several ethmoid air cells bilaterally. Other visualized paranasal sinuses are clear. Orbits appear symmetric bilaterally. Other: There is opacification in several inferior mastoid air cells on the right. Mastoids elsewhere clear. IMPRESSION: Mild atrophy with patchy periventricular small vessel disease, stable. No intracranial mass, hemorrhage, or evidence of infarct. No extra-axial fluid collection. Foci of arterial vascular calcification noted. There is mild inferior mastoid air cell disease on the right as well as mucosal thickening in several ethmoid air cells. Electronically Signed   By: Lowella Grip III M.D.   On: 07/20/2017 11:27   Ct Chest Wo Contrast  Result Date: 07/20/2017 CLINICAL DATA:  Progressive shortness of breath. Abnormal chest radiograph EXAM: CT CHEST WITHOUT CONTRAST TECHNIQUE: Multidetector CT imaging of the chest was performed following the standard protocol without IV contrast. COMPARISON:  Chest radiograph July 20, 2017; chest CT May 04, 2016 FINDINGS: Cardiovascular: There is no appreciable thoracic aortic aneurysm. There is atherosclerotic calcification in the proximal visualized great vessels. There is atherosclerotic calcification throughout the aorta. There are multiple foci of coronary artery calcification. Pericardium is not appreciably thickened. Mediastinum/Nodes: Thyroid appears unremarkable. There is extensive adenopathy throughout the mediastinum. In the right pretracheal region, there is  a lymph node measuring 2.8 x 2.4 cm. There are multiple lymph nodes adjacent to the aortic arch on the left. There is a focal lymph node in the aortopulmonary window measuring 1.9 x 1.8 cm. There are lymph nodes tracking anterior to the left main bronchus, largest measuring 2.0 x 1.2 cm. There is an adjacent lymph node measuring 1.6 x 1.0 cm. There are lymph nodes in the left hilum which are difficult to distinguish from adjacent vessel in the absence of contrast. There is a focal right hilar lymph node measuring 1.3 x 1.1 cm. There is a sub- carinal lymph node measuring 2.6 x 1.4 cm. There is a small hiatal hernia. Lungs/Pleura: There is widespread pulmonary fibrotic type change, slightly more severe overall compared to the prior study. There is an area that appears masslike in the posterior segment of the left upper lobe measuring 4.4 x 3.6 cm. There is nearby airspace consolidation in the posterior segment of the left upper lobe and in the superior segment of the left lower lobe. Areas of opacity in the apices are stable and likely represent chronic scarring and fibrosis as opposed to more acute pneumonia. There is chronic pleural thickening on the left posteriorly and inferiorly. There is no free-flowing effusion. There are foci of calcification along the left hemidiaphragm. There are small calcified granulomas in the superior segment of the left lower lobe peer Upper Abdomen: There is atherosclerotic calcification in the upper abdominal aorta. A slight amount of calcification is noted in the left adrenal. No adrenal masses are evident. There is no upper abdominal adenopathy. Musculoskeletal: There is degenerative change in the thoracic spine. There are no blastic or lytic bone lesions. IMPRESSION: 1. Apparent mass in the posterior segment of the left upper lobe with areas of nearby airspace consolidation in the posterior segment left upper lobe and superior segment left lower lobe. This masslike area  measures  4.4 x 3.6 cm. 2. Multifocal adenopathy throughout the mediastinum and hilar regions. Neoplasm is suspected. Given this circumstance, correlation with nuclear medicine PET study may well be advised. 3. Extensive underlying pulmonary fibrosis. Multiple areas of scarring. Small calcified granulomas in the superior segment left lower lobe. Note that the degree of pulmonary fibrosis has progressed since prior study. 4. Foci of left diaphragmatic calcification, likely due to previous asbestos exposure. 5.  Hiatal hernia present. 6. Foci of atherosclerotic calcification aorta as well as foci of coronary artery calcification. Aortic Atherosclerosis (ICD10-I70.0). Electronically Signed   By: Lowella Grip III M.D.   On: 07/20/2017 13:02   Dg Chest Portable 1 View  Result Date: 07/13/2017 CLINICAL DATA:  Pneumonia EXAM: PORTABLE CHEST 1 VIEW COMPARISON:  07/24/2017 FINDINGS: Consolidation/masslike area in the left upper lobe/ lingula again noted, stable. Diffuse interstitial prominence within the lungs, unchanged. Mild cardiomegaly. No visible effusions or acute bony abnormality. IMPRESSION: Stable masslike area of consolidation in the peripheral left upper lobe. Stable chronic interstitial changes. Electronically Signed   By: Rolm Baptise M.D.   On: 08/04/2017 10:11   Dg Hip Unilat With Pelvis 2-3 Views Right  Result Date: 07/26/2017 CLINICAL DATA:  Family reports pt fell at home 1 week ago Sunday. PT reports right hip pain on standing attempt and c/o to family of right hip/groin pain. EXAM: DG HIP (WITH OR WITHOUT PELVIS) 2-3V RIGHT COMPARISON:  None. FINDINGS: No fracture.  No bone lesion. Left hip hemiarthroplasty appears well-seated and aligned. The right hip joint is normally spaced and aligned. No significant arthropathic change. SI joints and symphysis pubis are normally aligned. Bones are demineralized. Soft tissues are unremarkable. IMPRESSION: No fracture, dislocation or acute finding. Electronically  Signed   By: Lajean Manes M.D.   On: 07/26/2017 09:31   US Abdomen Limited Ruq  Result Date: 08/06/2017 CLINICAL DATA:  Elevated liver function tests. EXAM: ULTRASOUND ABDOMEN LIMITED RIGHT UPPER QUADRANT COMPARISON:  CT scan of July 20, 2017 and October 02, 2012. FINDINGS: Gallbladder: Gallbladder is not visualized and presumably has been surgically resected. No sonographic Murphy's sign is noted. Common bile duct: Diameter: 2.8 mm which is within normal limits. Liver: No focal lesion identified. Slightly increased echogenicity of hepatic parenchyma is noted suggesting fatty infiltration or other diffuse hepatocellular disease. The portal vein is not demonstrated to be patent on Doppler, and portal venous thrombosis or occlusion cannot be excluded. IMPRESSION: Gallbladder is not visualized and presumably has been surgically resected. No biliary dilatation is noted. Slightly increased echogenicity of hepatic parenchyma is noted suggesting fatty infiltration or other diffuse hepatocellular disease. Portal vein is not demonstrated to be patent on Doppler, and portal vein thrombosis or occlusion cannot be excluded. CT scan of the abdomen may be performed further evaluation. Electronically Signed   By: Marijo Conception, M.D.   On: 07/17/2017 11:54    Microbiology Recent Results (from the past 240 hour(s))  Blood culture (routine x 2)     Status: None (Preliminary result)   Collection Time: 07/27/2017 12:22 PM  Result Value Ref Range Status   Specimen Description BLOOD BLOOD LEFT HAND  Final   Special Requests   Final    BOTTLES DRAWN AEROBIC AND ANAEROBIC Blood Culture adequate volume   Culture NO GROWTH < 24 HOURS  Final   Report Status PENDING  Incomplete  Blood culture (routine x 2)     Status: None (Preliminary result)   Collection Time: 07/29/2017 12:23 PM  Result  Value Ref Range Status   Specimen Description BLOOD BLOOD RIGHT FOREARM  Final   Special Requests   Final    BOTTLES DRAWN AEROBIC  AND ANAEROBIC Blood Culture adequate volume   Culture NO GROWTH < 24 HOURS  Final   Report Status PENDING  Incomplete    Lab Basic Metabolic Panel:  Recent Labs Lab 08/05/17 1012 08/06/2017 0947 August 29, 2017 0432 08/29/2017 0733  NA 138 140 145 144  K 4.5 4.5 5.8* 6.4*  CL 104 107 112* 112*  CO2 25 21* 16* 15*  GLUCOSE 86 110* 64* 51*  BUN 49* 83* 98* 103*  CREATININE 1.05 1.62* 2.56* 2.80*  CALCIUM 8.8* 9.0 8.7* 8.7*   Liver Function Tests:  Recent Labs Lab 08/05/17 1012 08/06/2017 0947 August 29, 2017 0432  AST 298* 419* 579*  ALT 329* 331* 402*  ALKPHOS 705* 793* 747*  BILITOT 8.2* 11.8* 12.6*  PROT 5.0* 5.1* 4.8*  ALBUMIN 2.3* 2.3* 2.2*    Recent Labs Lab 07/12/2017 0947  LIPASE 47    Recent Labs Lab 08/03/17 0620 08/05/17 1012  AMMONIA 61* 87*   CBC:  Recent Labs Lab 08/05/17 1012 07/12/2017 0947 2017-08-29 0432  WBC 15.4* 16.1* 15.0*  NEUTROABS 13.2*  --   --   HGB 14.8 15.5 15.3  HCT 44.7 45.3 45.8  MCV 94.9 92.5 95.6  PLT 149* 144* 144*   Cardiac Enzymes:  Recent Labs Lab 07/20/2017 0947 07/09/2017 1651 08/05/2017 2228 2017-08-29 0432  TROPONINI 0.08* 0.10* 0.10* 0.13*   Sepsis Labs:  Recent Labs Lab 08/05/17 1012 07/29/2017 0947 2017-08-29 0432  WBC 15.4* 16.1* 15.0*    Procedures/Operations  none   Bellah Alia Aug 29, 2017, 11:02 AM

## 2017-09-07 NOTE — Clinical Social Work Note (Signed)
Clinical Social Work Assessment  Patient Details  Name: Jeffrey Haynes MRN: [id number] Date of Birth: [birth year]  Date of referral:  08/21/2017               Reason for consult:  End of Life/Hospice                Permission sought to share information with:  Jeffrey Haynes granted to share information::  Yes, Verbal Permission Granted  Name::      Jeffrey Haynes/ Jeffrey Haynes::     Relationship::     Contact Information:     Housing/Transportation Living arrangements for the past 2 months:  [place], [place] of Information:  Jeffrey Haynes, Adult Children Patient Interpreter Needed:  None Criminal Activity/Legal Involvement Pertinent to Current Situation/Hospitalization:  No - Comment as needed Significant Relationships:  Adult Children Lives with:  Adult Children, Facility Resident Do you feel safe going back to the place where you live?    Need for family participation in patient care:  Yes (Comment)  Care giving concerns:  Patient came to [facility] from [facility], where he was at for short term rehab.    Social Worker assessment / plan:  Holiday representative (CSW) received consult from attending MD for hospice facility arrangments. CSW contacted patient's son Jeffrey Haynes to get additional information. Per Tim patient lives with him in [place] and patient has been to [facility] for short term rehab. Per Jeffrey Haynes his brother Jeffrey Haynes is patient's HPOA and Jeffrey Haynes and his sister Jeffrey Haynes are in patient's room this morning. Jeffrey Haynes is agreeable for patient to go to a hospice facility and prefers [facility] [address] in [place]. CSW met with patient's son Jeffrey Haynes/ HPOA and patient's daughter Jeffrey Haynes at bedside. Patient was laying in the bed and did not participate in assessment. Per Jeffrey Haynes and Jeffrey Haynes they are both agreeable for patient to be moved to the hospice facility in [place]. Jeffrey Haynes and [place] spoke highly of Jeffrey Haynes and his  assistance in making patient comfort care.   CSW contacted [facility] facility and sent referral. CSW is waiting on a call back from the hospice facility to confirm bed availability. CSW will continue to follow and assist as needed.   Employment status:  Disabled (Comment on whether or not currently receiving Disability) Insurance information:  Managed Medicare PT Recommendations:  Not assessed at this time Information / Referral to community resources:  Other (Comment Required) ([place] Referral. )  Patient/Family's Response to care:  Patient's 3 adult children have agreed to make patient comfort care and move patient to the hospice facility in [place].   Patient/Family's Understanding of and Emotional Response to Diagnosis, Current Treatment, and Prognosis: Patient's 3 adult children understand patient has a poor prognosis and want hospice services. CSW provided emotional support to the 3 adult children.   Emotional Assessment Appearance:  Appears stated age Attitude/Demeanor/Rapport:  Unable to Assess Affect (typically observed):  Unable to Assess Orientation:  Fluctuating Orientation (Suspected and/or reported Sundowners), Oriented to Self Alcohol / Substance use:  Not Applicable Psych involvement (Current and /or in the community):  No (Comment)  Discharge Needs  Concerns to be addressed:  Discharge Planning Concerns Readmission within the last 30 days:  Yes Current discharge risk:  Chronically ill, Cognitively Impaired, Dependent with Mobility, Terminally ill Barriers to Discharge:  Continued Medical Work up   [employer], Veronia Beets, LCSW 08-21-2017, 9:03 AM

## 2017-09-07 NOTE — Plan of Care (Signed)
Dr. Manuella Ghazi was notified about critical K+ of 6.4.  Pt is now comfort care measures only.  We will move patient into larger room No. 5.

## 2017-09-07 NOTE — Progress Notes (Signed)
SLP Cancellation Note  Patient Details Name: Jeffrey Haynes MRN: [id number] DOB: 01/20/1925   Cancelled treatment:       Reason Eval/Treat Not Completed:  (chart reviewed; consulted NSG; pt deceased)    Orinda Kenner, [place], Enoch Damon Baisch 08-27-2017, 11:31 AM

## 2017-09-07 NOTE — Progress Notes (Signed)
Patient ID: Jeffrey Haynes, male   DOB: 1925/11/15, 83 y.o.   MRN: [id number]  GI consult cancelled by Dr. Manuella Ghazi.

## 2017-09-07 NOTE — Progress Notes (Signed)
Family Meeting Note  Advance Directive:yes  Today a meeting took place with the Patient and Son, daughter.  Patient is unable to participate due LV:DIXVEZ capacity Lethargic/comatose   The following clinical team members were present during this meeting:MD  The following were discussed:Patient's diagnosis: , Patient's progosis: < 2 weeks and Goals for treatment: DNR   Diagnosis: COPD, essential hypertension, hyperlipidemia  Hospice Diagnosis: Lung mass - Likely cancer with mets Multiorgan failure Acute metabolic encephalopathy  Additional follow-up to be provided: SW - looks for Hospice Home beds  Time spent during discussion:20 minutes  Max Sane, MD

## 2017-09-07 NOTE — Progress Notes (Addendum)
**Note Jeffrey-Identified via Obfuscation** [facility] NAME: Jeffrey Haynes    MR#:  [id number]  DATE OF BIRTH:  May 11, 1925  SUBJECTIVE:  CHIEF COMPLAINT:   Chief Complaint  Patient presents with  . Abnormal Labs  K high, Moaning and groaning REVIEW OF SYSTEMS:  Review of Systems  Unable to perform ROS: Critical illness    DRUG ALLERGIES:  No Known Allergies VITALS:  Blood pressure (!) 107/51, pulse 83, temperature 99.7 F (37.6 C), temperature source Axillary, resp. rate (!) 38, height 6' (1.829 m), weight 83.6 kg (184 lb 4.9 oz), SpO2 95 %. PHYSICAL EXAMINATION:  Physical Exam  Constitutional: He appears malnourished and dehydrated. He appears unhealthy. He appears cachectic. He appears toxic. He has a sickly appearance. He appears distressed.  HENT:  Head: Normocephalic and atraumatic.  Eyes: Pupils are equal, round, and reactive to light. Conjunctivae and EOM are normal.  Neck: Normal range of motion. Neck supple. No tracheal deviation present. No thyromegaly present.  Cardiovascular: Normal rate, regular rhythm and normal heart sounds.   Pulmonary/Chest: Accessory muscle usage present. Tachypnea noted. He is in respiratory distress. He has decreased breath sounds. He has no wheezes. He has rhonchi. He exhibits no tenderness.  Abdominal: Soft. Bowel sounds are normal. He exhibits no distension. There is no tenderness.  Musculoskeletal: Normal range of motion.  Neurological: He is disoriented. No cranial nerve deficit.  Skin: Skin is warm and dry. No rash noted.  Psychiatric: Mood and affect normal.   LABORATORY PANEL:  Male CBC  Recent Labs Lab Aug 27, 2017 0432  WBC 15.0*  HGB 15.3  HCT 45.8  PLT 144*   ------------------------------------------------------------------------------------------------------------------ Chemistries   Recent Labs Lab 08/27/17 0432  NA 145  K 5.8*  CL 112*  CO2 16*  GLUCOSE 64*  BUN 98*  CREATININE 2.56*  CALCIUM 8.7*    AST 579*  ALT 402*  ALKPHOS 747*  BILITOT 12.6*   RADIOLOGY:  Dg Chest Portable 1 View  Result Date: 07/16/2017 CLINICAL DATA:  Pneumonia EXAM: PORTABLE CHEST 1 VIEW COMPARISON:  07/24/2017 FINDINGS: Consolidation/masslike area in the left upper lobe/ lingula again noted, stable. Diffuse interstitial prominence within the lungs, unchanged. Mild cardiomegaly. No visible effusions or acute bony abnormality. IMPRESSION: Stable masslike area of consolidation in the peripheral left upper lobe. Stable chronic interstitial changes. Electronically Signed   By: Rolm Baptise M.D.   On: 07/23/2017 10:11   US Abdomen Limited Ruq  Result Date: 07/10/2017 CLINICAL DATA:  Elevated liver function tests. EXAM: ULTRASOUND ABDOMEN LIMITED RIGHT UPPER QUADRANT COMPARISON:  CT scan of July 20, 2017 and October 02, 2012. FINDINGS: Gallbladder: Gallbladder is not visualized and presumably has been surgically resected. No sonographic Murphy's sign is noted. Common bile duct: Diameter: 2.8 mm which is within normal limits. Liver: No focal lesion identified. Slightly increased echogenicity of hepatic parenchyma is noted suggesting fatty infiltration or other diffuse hepatocellular disease. The portal vein is not demonstrated to be patent on Doppler, and portal venous thrombosis or occlusion cannot be excluded. IMPRESSION: Gallbladder is not visualized and presumably has been surgically resected. No biliary dilatation is noted. Slightly increased echogenicity of hepatic parenchyma is noted suggesting fatty infiltration or other diffuse hepatocellular disease. Portal vein is not demonstrated to be patent on Doppler, and portal vein thrombosis or occlusion cannot be excluded. CT scan of the abdomen may be performed further evaluation. Electronically Signed   By: Marijo Conception, M.D.   On: 07/20/2017 11:54  ASSESSMENT AND PLAN:  Jeffrey Haynes  is a 83 y.o. male with a known history of COPD, essential hypertension,  hyperlipidemia and multiple other medical problems was recently discharged from the hospital on August 19 th pneumonia after completing antibiotic course with Rocephin and azithromycin. Patient was diagnosed with a lung mass at that time and pulmonology has recommended repeat CT chest in 4-6 weeks after completing the antibiotic course. Chest x-ray today has revealed stable mass like area of consolidation in the peripheral left upper lobe  # Acute Metabolic encephalopathy - multifactorial - from multiorgan failure  * Hyperkalemia - give kayexalte  # ARF: creat 2.56 - worrisome for prerenal and/ATN - can't r/o obstruction - Avoid nephrotoxins and monitor renal function  #Jaundice with elevated bilirubin at 12.6 with possible hepatocellular disease - Continue IV fluids - monitor LFTs Ultrasound did not reveal gallbladder and common bile duct is normal GI consult is pending - this could be due to mets from primary Lung CA  # FTT - Decreased by mouth intake since previous hospital admission, Patient was discharged from the hospital with a pneumonia,discharged patient after completing antibiotic course with IV Rocephin and azithromycin - Palliative care consult won't be done due to long holiday weekend  consult dietitian for calorie count once patient starts taking by mouth  #Stable left upper lobe mass Pulmonology has recommended to repeat CT chest in 6 weeks during previous admission, and patient was discharged from the hospital on August 19 during previous admission - will go ahead and get PET scan - i'm highly concerned that this is lung cancer  # Elevated troponin  - due to demand ischemia, no MI  #hypothyroidism Patient takes by mouth levothyroxine 75 g we'll change it to IV   #Essential hypertension Blood pressure is soft at this time from dehydration holding metoprolol   He looks critically sick, with multiorgan failure. High risk for cardio-pulmo failure and  arrest.  He likely won't survive this admission. Seems appropriate for Hospice if family is in agreement.     All the records are reviewed and case discussed with Care Management/Social Worker. Management plans discussed with the patient, family (daughter Manuela Schwartz at bedside) and they are in agreement.  CODE STATUS: DNR  TOTAL TIME (critical care) TAKING CARE OF THIS PATIENT: 35 minutes.   More than 50% of the time was spent in counseling/coordination of care: YES  POSSIBLE D/C IN 2-3 DAYS, DEPENDING ON CLINICAL CONDITION. Seems appropriate for Hospice Home/ Home with Hospice   Max Sane M.D on 14-Aug-2017 at 7:24 AM  Between 7am to 6pm - Pager - [phone]  After 6pm go to [email or web address] - Proofreader  [facility]  Office  [phone]  CC: Primary care physician; [name], MD  Note: This dictation was prepared with Dragon dictation along with smaller phrase technology. Any transcriptional errors that result from this process are unintentional.

## 2017-09-07 DEATH — deceased

## 2017-09-08 ENCOUNTER — Inpatient Hospital Stay: Payer: Medicare Other | Admitting: Internal Medicine

## 2017-11-09 ENCOUNTER — Encounter: Payer: Medicare Other | Admitting: Family Medicine

## 2018-02-23 IMAGING — US US ABDOMEN LIMITED
1 series · 14 of 25 positions shown · non-contrast
Comparison: CT scan of July 20, 2017 and October 02, 2012.

CLINICAL DATA: Elevated liver function tests.

EXAM:
ULTRASOUND ABDOMEN LIMITED RIGHT UPPER QUADRANT

[Series 2: us abdomen limited · 0.28mm/px · 14 of 38 slices shown]
[im 1/38]
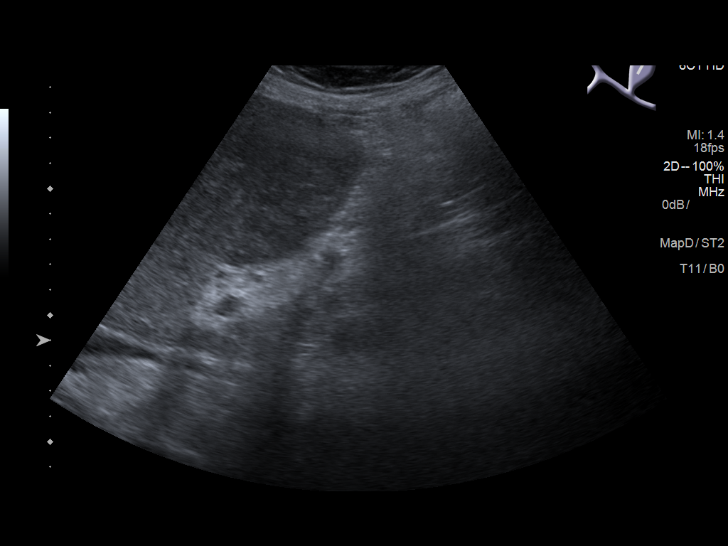
[im 4/38]
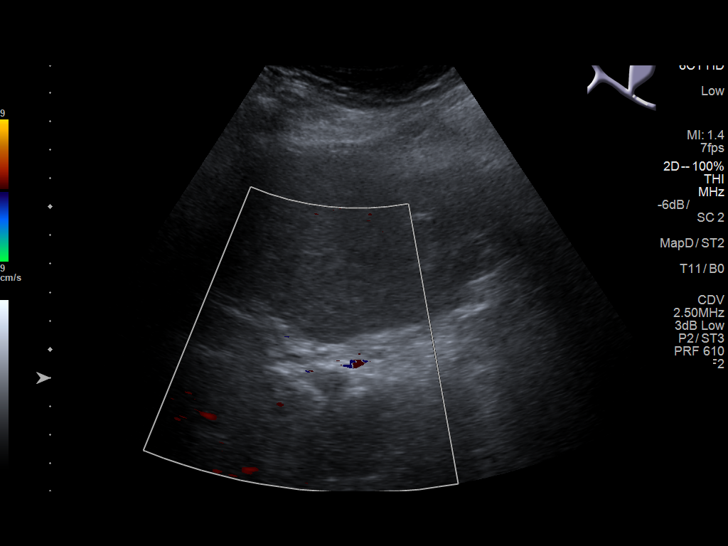
[im 7/38]
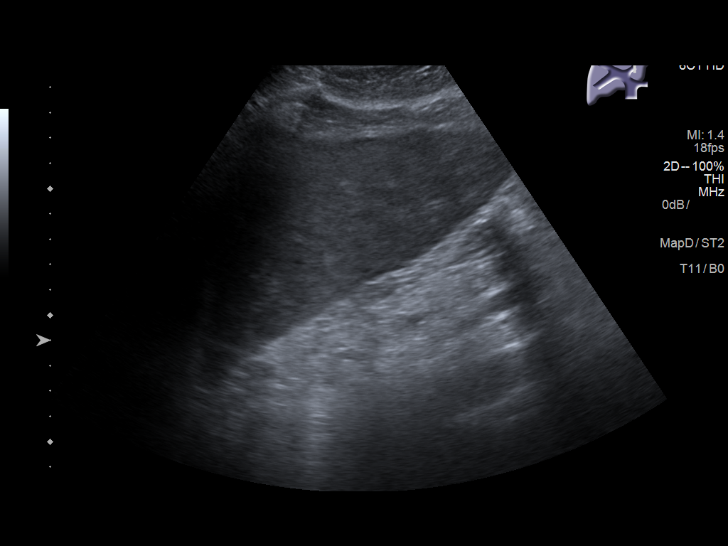
[im 10/38]
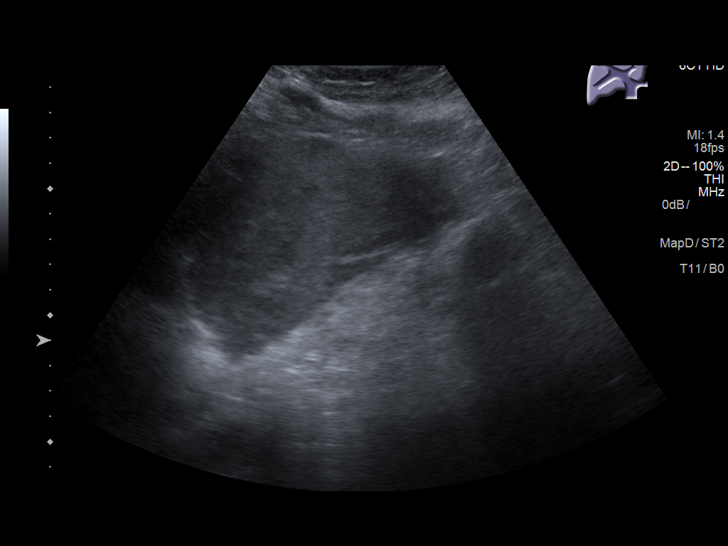
[im 13/38]
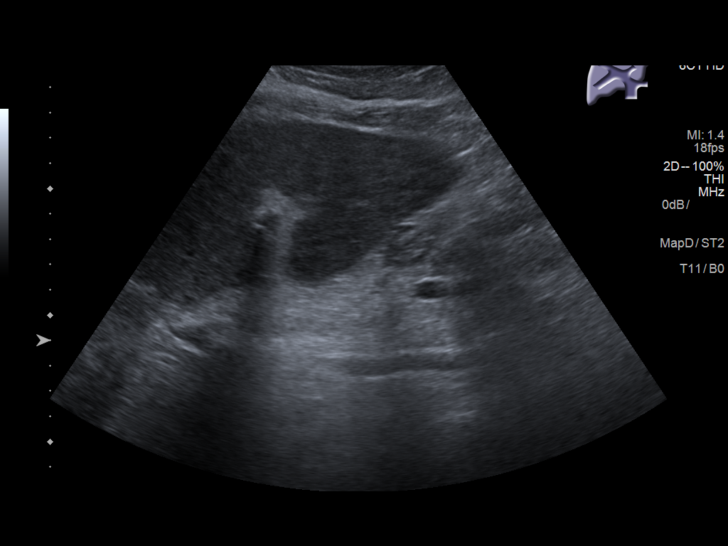
[im 14/38]
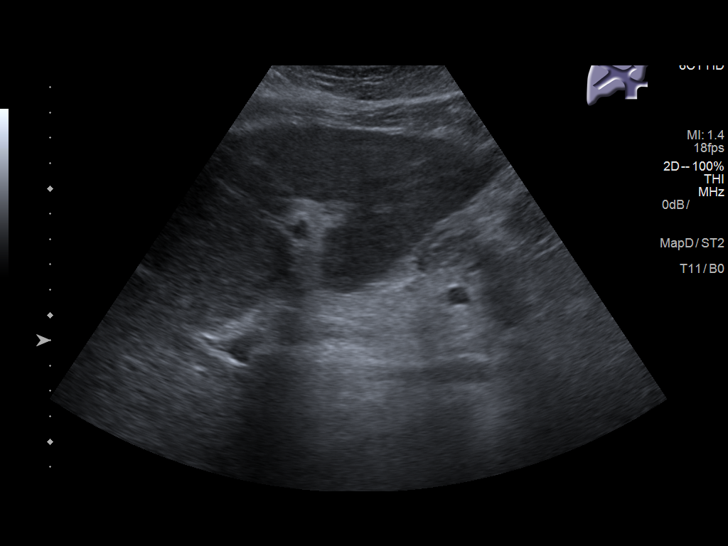
[im 17/38]
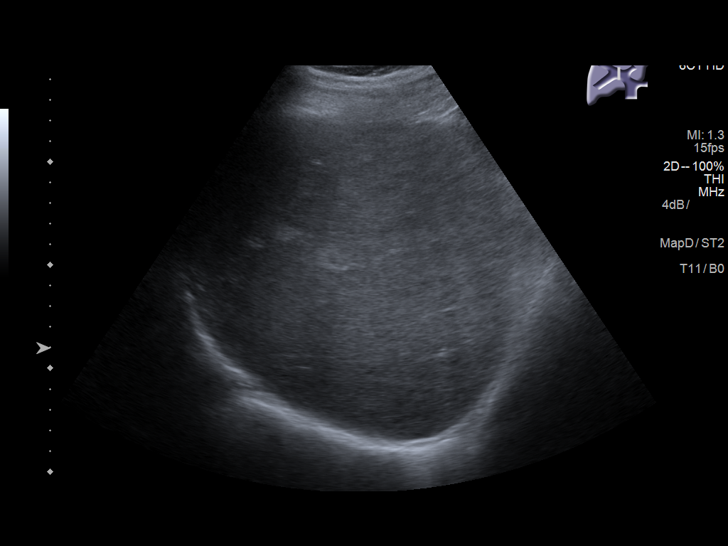
[im 21/38]
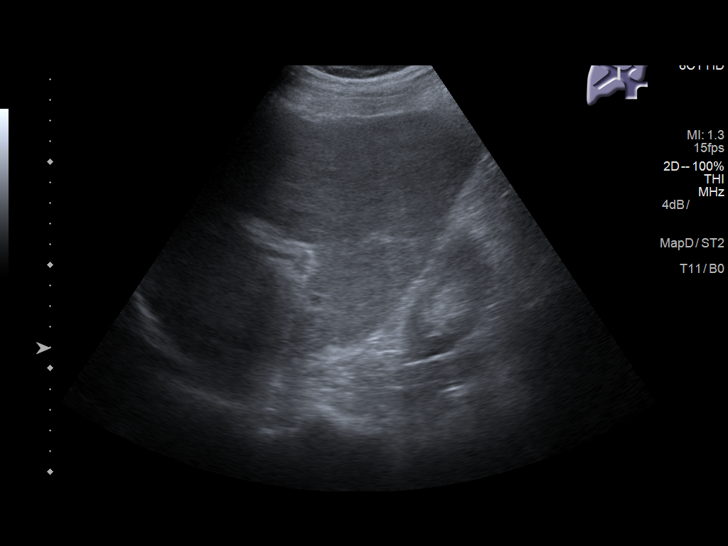
[im 24/38]
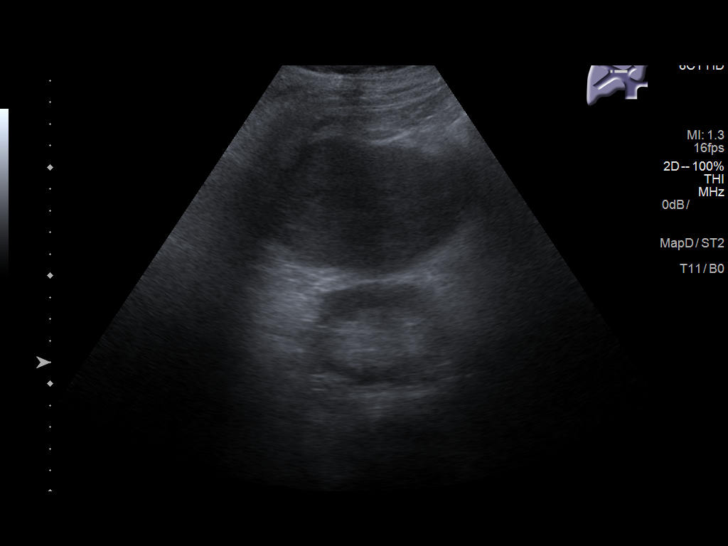
[im 25/38]
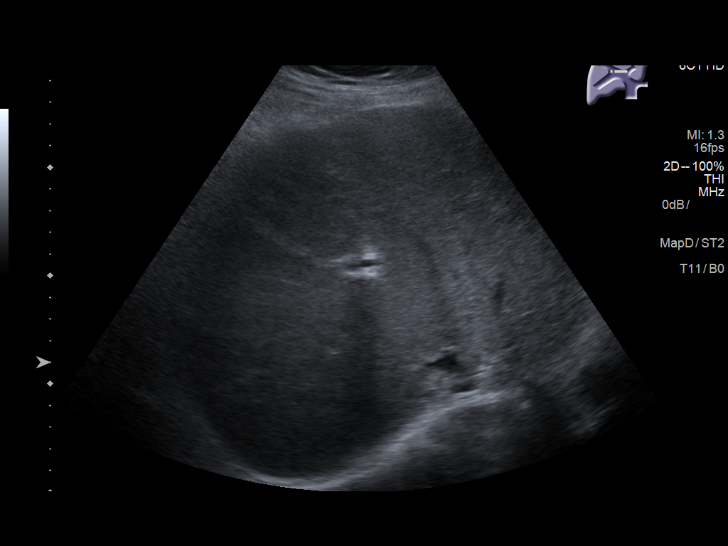
[im 28/38]
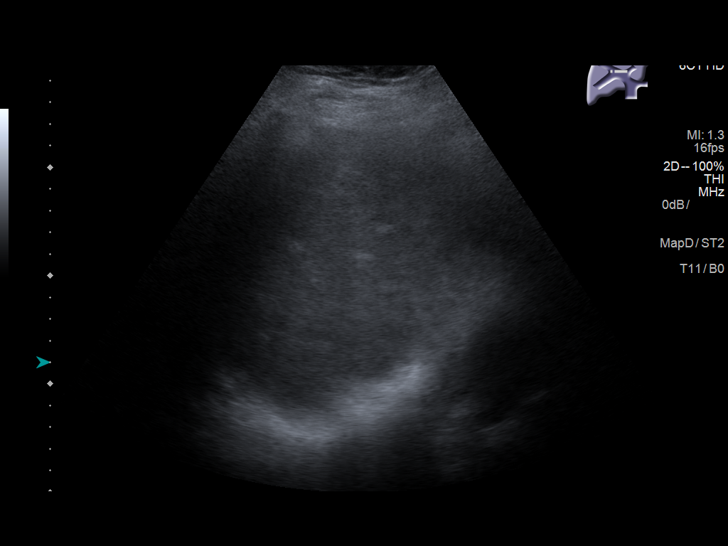
[im 31/38]
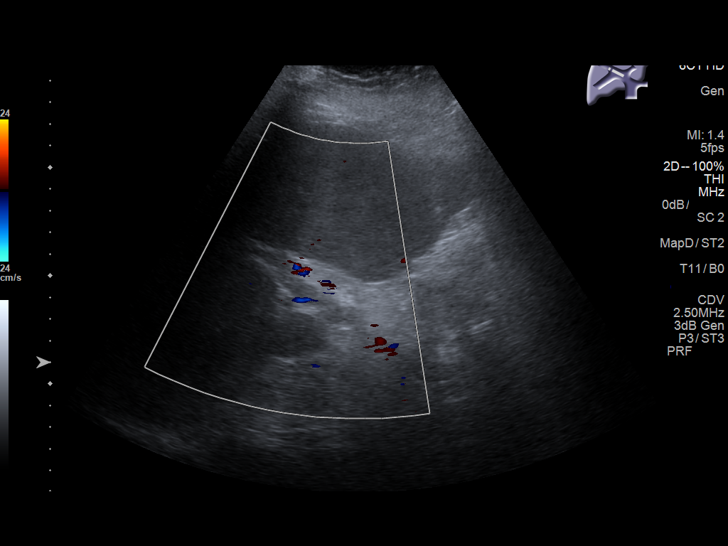
[im 34/38]
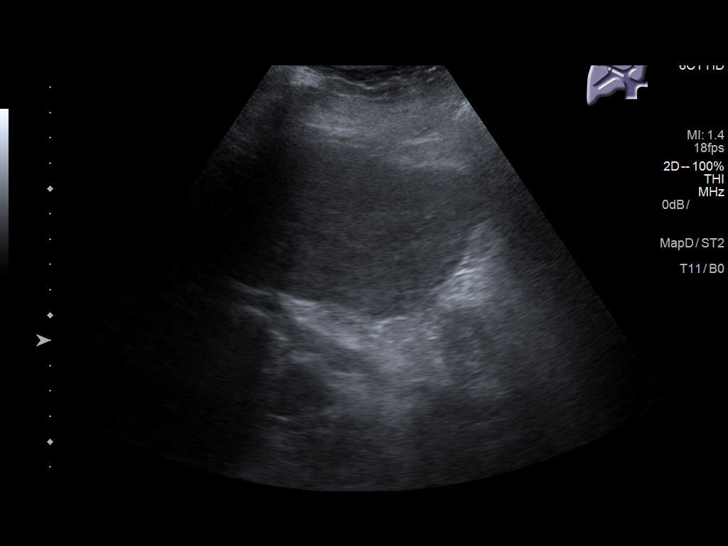
[im 38/38]
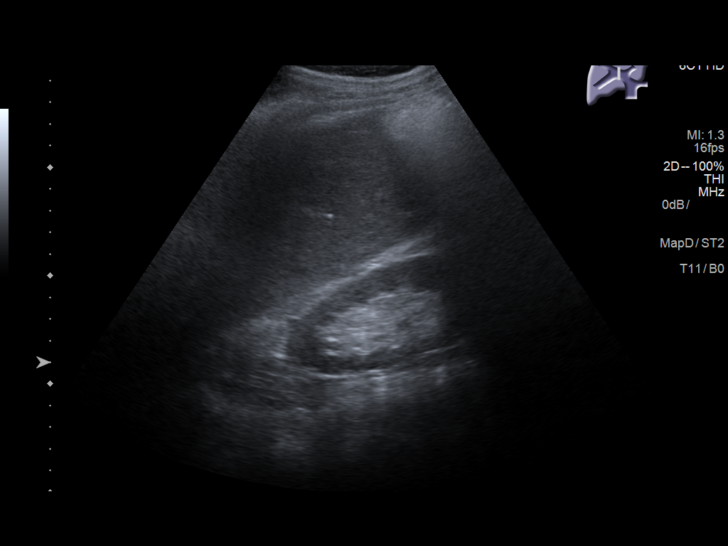

[14 of 25 positions shown; findings below may reference images not displayed]

FINDINGS: Gallbladder:

Gallbladder is not visualized and presumably has been surgically
resected. No sonographic Murphy's sign is noted.

Common bile duct:

Diameter: 2.8 mm which is within normal limits.

Liver:

No focal lesion identified. Slightly increased echogenicity of
hepatic parenchyma is noted suggesting fatty infiltration or other
diffuse hepatocellular disease. The portal vein is not demonstrated
to be patent on Doppler, and portal venous thrombosis or occlusion
cannot be excluded.
IMPRESSION: Gallbladder is not visualized and presumably has been surgically
resected.

No biliary dilatation is noted.

Slightly increased echogenicity of hepatic parenchyma is noted
suggesting fatty infiltration or other diffuse hepatocellular
disease.

Portal vein is not demonstrated to be patent on Doppler, and portal
vein thrombosis or occlusion cannot be excluded. CT scan of the
abdomen may be performed further evaluation.
